# Patient Record
Sex: Male | Born: 1950
Health system: Southern US, Community
[De-identification: ages and names within clinical notes are randomized; demographics above are authoritative.]

## PROBLEM LIST (undated history)

## (undated) DIAGNOSIS — M1990 Unspecified osteoarthritis, unspecified site: Secondary | ICD-10-CM

## (undated) DIAGNOSIS — R001 Bradycardia, unspecified: Secondary | ICD-10-CM

## (undated) DIAGNOSIS — I10 Essential (primary) hypertension: Secondary | ICD-10-CM

## (undated) DIAGNOSIS — I491 Atrial premature depolarization: Secondary | ICD-10-CM

## (undated) DIAGNOSIS — C61 Malignant neoplasm of prostate: Secondary | ICD-10-CM

## (undated) DIAGNOSIS — E7800 Pure hypercholesterolemia, unspecified: Secondary | ICD-10-CM

## (undated) DIAGNOSIS — I493 Ventricular premature depolarization: Secondary | ICD-10-CM

## (undated) DIAGNOSIS — N183 Chronic kidney disease, stage 3 (moderate): Secondary | ICD-10-CM

## (undated) DIAGNOSIS — N182 Chronic kidney disease, stage 2 (mild): Secondary | ICD-10-CM

## (undated) HISTORY — DX: Bradycardia, unspecified: R00.1

## (undated) HISTORY — DX: Chronic kidney disease, stage 2 (mild): N18.2

## (undated) HISTORY — DX: Atrial premature depolarization: I49.1

## (undated) HISTORY — DX: Ventricular premature depolarization: I49.3

## (undated) HISTORY — PX: OTHER SURGICAL HISTORY: SHX169

## (undated) HISTORY — PX: INSERTION PROSTATE RADIATION SEED: SUR718

## (undated) HISTORY — PX: ROTATOR CUFF REPAIR: SHX139

## (undated) HISTORY — PX: PROSTATE BIOPSY: SHX241

---

## 1999-05-27 ENCOUNTER — Emergency Department (HOSPITAL_COMMUNITY): Admission: EM | Admit: 1999-05-27 | Discharge: 1999-05-27 | Payer: Self-pay | Admitting: Emergency Medicine

## 2000-05-09 ENCOUNTER — Emergency Department (HOSPITAL_COMMUNITY): Admission: EM | Admit: 2000-05-09 | Discharge: 2000-05-09 | Payer: Self-pay | Admitting: Emergency Medicine

## 2005-01-14 ENCOUNTER — Emergency Department (HOSPITAL_COMMUNITY): Admission: EM | Admit: 2005-01-14 | Discharge: 2005-01-14 | Payer: Self-pay | Admitting: *Deleted

## 2005-01-15 ENCOUNTER — Emergency Department (HOSPITAL_COMMUNITY): Admission: EM | Admit: 2005-01-15 | Discharge: 2005-01-15 | Payer: Self-pay | Admitting: Emergency Medicine

## 2007-02-18 ENCOUNTER — Emergency Department (HOSPITAL_COMMUNITY): Admission: EM | Admit: 2007-02-18 | Discharge: 2007-02-18 | Payer: Self-pay | Admitting: Emergency Medicine

## 2007-03-07 ENCOUNTER — Emergency Department (HOSPITAL_COMMUNITY): Admission: EM | Admit: 2007-03-07 | Discharge: 2007-03-07 | Payer: Self-pay | Admitting: Emergency Medicine

## 2011-04-06 LAB — URINE MICROSCOPIC-ADD ON

## 2011-04-06 LAB — GC/CHLAMYDIA PROBE AMP, GENITAL
Chlamydia, DNA Probe: NEGATIVE
GC Probe Amp, Genital: POSITIVE — AB

## 2011-04-06 LAB — URINALYSIS, ROUTINE W REFLEX MICROSCOPIC
Bilirubin Urine: NEGATIVE
Glucose, UA: NEGATIVE
Ketones, ur: NEGATIVE
Nitrite: NEGATIVE
Protein, ur: 30 — AB
Specific Gravity, Urine: 1.025
Urobilinogen, UA: 0.2
pH: 5.5

## 2011-04-06 LAB — RPR: RPR Ser Ql: NONREACTIVE

## 2012-04-24 ENCOUNTER — Emergency Department (HOSPITAL_COMMUNITY)
Admission: EM | Admit: 2012-04-24 | Discharge: 2012-04-24 | Disposition: A | Discharge status: Home / Self Care | Payer: BC Managed Care – PPO | Attending: Emergency Medicine | Admitting: Emergency Medicine

## 2012-04-24 ENCOUNTER — Encounter (HOSPITAL_COMMUNITY): Payer: Self-pay | Admitting: *Deleted

## 2012-04-24 DIAGNOSIS — IMO0002 Reserved for concepts with insufficient information to code with codable children: Secondary | ICD-10-CM | POA: Insufficient documentation

## 2012-04-24 DIAGNOSIS — E7800 Pure hypercholesterolemia, unspecified: Secondary | ICD-10-CM | POA: Insufficient documentation

## 2012-04-24 DIAGNOSIS — I10 Essential (primary) hypertension: Secondary | ICD-10-CM | POA: Insufficient documentation

## 2012-04-24 DIAGNOSIS — M5410 Radiculopathy, site unspecified: Secondary | ICD-10-CM

## 2012-04-24 DIAGNOSIS — Z79899 Other long term (current) drug therapy: Secondary | ICD-10-CM | POA: Insufficient documentation

## 2012-04-24 HISTORY — DX: Pure hypercholesterolemia, unspecified: E78.00

## 2012-04-24 HISTORY — DX: Essential (primary) hypertension: I10

## 2012-04-24 MED ORDER — PREDNISONE 20 MG PO TABS
60.0000 mg | ORAL_TABLET | Freq: Once | ORAL | Status: AC
  Administered 2012-04-24: 60 mg via ORAL
  Filled 2012-04-24: qty 3

## 2012-04-24 MED ORDER — KETOROLAC TROMETHAMINE 30 MG/ML IJ SOLN
30.0000 mg | Freq: Once | INTRAMUSCULAR | Status: AC
  Administered 2012-04-24: 30 mg via INTRAMUSCULAR
  Filled 2012-04-24: qty 1

## 2012-04-24 NOTE — ED Notes (Addendum)
PT to ED c/o R back (scapula) and shoulder pain and R thumb numbness.  He was seen at [facility] 1 week ago and was given pain meds and muscle relaxants.  They helped initially, but today his MD was supposed to call in a different medication but did not.  Pt lifts weights, but has not done so in several weeks.

## 2012-04-24 NOTE — ED Provider Notes (Signed)
History     CSN: [id number]  Arrival date & time 04/24/12  0000   First MD Initiated Contact with Patient 04/24/12 [phone]      Chief Complaint  Patient presents with  . Shoulder Pain    (Consider location/radiation/quality/duration/timing/severity/associated sxs/prior treatment) HPI Comments: Patient states, that for the past 2, weeks.  He's had right shoulder pain with discomfort, radiating down his right arm.  He was seen at urgent care, and given Vicodin, and a muscle relaxer, which helped some, but not total relief.  He was seen by his primary care Dr. today Dr. seen he said he would call in a prescription for "something" that will help his discomfort and numbness and tingling.  Unfortunately, this patient is not a cold, and and will not be able to get it until tomorrow with pharmacy and his primary care physician's office opens.  He is not seeking pain medication, or muscle relaxer, as he, states he has these at home, but is looking for pain relief, so he can sleep tonight Patient denies any trauma, neck pain.  He does lift weights, but has not been in approximately 3 weeks.  He does not recall injuring himself while he was lifting weights.  Denies any MVC, fall. previous injury to his back  Patient is a 61 y.o. male presenting with shoulder pain. The history is provided by the patient.  Shoulder Pain This is a recurrent problem. The current episode started 1 to 4 weeks ago. The problem occurs constantly. Associated symptoms include numbness. Pertinent negatives include no chest pain, chills, fever, headaches or weakness.    Past Medical History  Diagnosis Date  . Hypercholesteremia   . Hypertension     History reviewed. No pertinent past surgical history.  No family history on file.  History  Substance Use Topics  . Smoking status: Never Smoker   . Smokeless tobacco: Not on file  . Alcohol Use: No      Review of Systems  Constitutional: Negative for fever and chills.    Respiratory: Negative for shortness of breath.   Cardiovascular: Negative for chest pain.  Musculoskeletal: Negative for back pain.  Neurological: Positive for numbness. Negative for dizziness, weakness and headaches.    Allergies  Review of patient's allergies indicates no known allergies.  Home Medications   Current Outpatient Rx  Name Route Sig Dispense Refill  . ATORVASTATIN CALCIUM 10 MG PO TABS Oral Take 10 mg by mouth daily.    . CYCLOBENZAPRINE HCL 5 MG PO TABS Oral Take 5 mg by mouth 3 (three) times daily as needed.    [name] HYDROCODONE-ACETAMINOPHEN 5-325 MG PO TABS Oral Take 1 tablet by mouth every 6 (six) hours as needed.    [name] VALSARTAN 160 MG PO TABS Oral Take 160 mg by mouth daily.      BP 186/90  Pulse 95  Temp 98.3 F (36.8 C) (Oral)  Resp 16  SpO2 96%  Physical Exam  Constitutional: He is oriented to person, place, and time. He appears well-developed and well-nourished.  HENT:  Head: Normocephalic.  Eyes: Pupils are equal, round, and reactive to light.  Neck: Normal range of motion. Muscular tenderness present.    Cardiovascular: Normal rate.   Pulmonary/Chest: Effort normal. No respiratory distress. He has no wheezes.  Musculoskeletal: Normal range of motion. He exhibits tenderness. He exhibits no edema.       Arms: Neurological: He is alert and oriented to person, place, and time.  No change in range of motion of the right arm.  No weakness in grasp, color, and temperature of right extremity, equal to left  Skin: Skin is warm.    ED Course  Procedures (including critical care time)  Labs Reviewed - No data to display No results found.   1. Radiculopathy affecting upper extremity       MDM  This patient appears to have some radiculopathy, and muscle swelling across the apex of the right shoulder.  I will treat for pain.  Tonight.  Allow him to followup with Dr. Eula Listen tomorrow to continue with the plan.  That was set forth, earlier  today        Arman Filter, NP 04/24/12 [phone]

## 2012-04-28 ENCOUNTER — Emergency Department (HOSPITAL_COMMUNITY)
Admission: EM | Admit: 2012-04-28 | Discharge: 2012-04-28 | Disposition: A | Discharge status: Home / Self Care | Payer: BC Managed Care – PPO | Attending: Emergency Medicine | Admitting: Emergency Medicine

## 2012-04-28 ENCOUNTER — Encounter (HOSPITAL_COMMUNITY): Payer: Self-pay | Admitting: *Deleted

## 2012-04-28 ENCOUNTER — Other Ambulatory Visit: Payer: Self-pay | Admitting: Geriatric Medicine

## 2012-04-28 DIAGNOSIS — E785 Hyperlipidemia, unspecified: Secondary | ICD-10-CM | POA: Insufficient documentation

## 2012-04-28 DIAGNOSIS — R209 Unspecified disturbances of skin sensation: Secondary | ICD-10-CM | POA: Insufficient documentation

## 2012-04-28 DIAGNOSIS — M542 Cervicalgia: Secondary | ICD-10-CM

## 2012-04-28 DIAGNOSIS — I10 Essential (primary) hypertension: Secondary | ICD-10-CM | POA: Insufficient documentation

## 2012-04-28 DIAGNOSIS — M79609 Pain in unspecified limb: Secondary | ICD-10-CM | POA: Insufficient documentation

## 2012-04-28 DIAGNOSIS — M79601 Pain in right arm: Secondary | ICD-10-CM

## 2012-04-28 MED ORDER — OXYCODONE-ACETAMINOPHEN 5-325 MG PO TABS: 2.0000 | ORAL_TABLET | ORAL | Status: DC | PRN

## 2012-04-28 MED ORDER — OXYCODONE-ACETAMINOPHEN 5-325 MG PO TABS
2.0000 | ORAL_TABLET | Freq: Once | ORAL | Status: AC
  Administered 2012-04-28: 2 via ORAL
  Filled 2012-04-28: qty 2

## 2012-04-28 MED ORDER — KETOROLAC TROMETHAMINE 60 MG/2ML IM SOLN
60.0000 mg | Freq: Once | INTRAMUSCULAR | Status: AC
  Administered 2012-04-28: 60 mg via INTRAMUSCULAR
  Filled 2012-04-28: qty 2

## 2012-04-28 NOTE — ED Provider Notes (Signed)
History     CSN: [id number]  Arrival date & time 04/28/12  0820   First MD Initiated Contact with Patient 04/28/12 [phone]      Chief Complaint  Patient presents with  . Arm Pain    R arm    (Consider location/radiation/quality/duration/timing/severity/associated sxs/prior treatment) HPI Comments: Patient is a 61 year old male who presents with right arm pain for the past 2 weeks. The pain started gradually and has progressively worsened since the onset. The patient denies any known trigger but thinks it maybe have been caused by his flu shot that he received last week, even though the arm pain was present before then. The pain is sharp and severe and started in his neck and radiates down to his right thumb. The pain is constant and interferes with sleep. No aggravating/alleviating factors. Patient has seen his PCP and urgent care for the pain and has tried Vicodin and Cyclobenzaprine without relief. Patient denies headache, fever/chills, chest pain, SOB, arthralgias, abdominal pain, saddle paresthesias, bowel/bladder incontinence.   Patient is a 61 y.o. male presenting with arm pain.  Arm Pain Associated symptoms include myalgias and neck pain.    Past Medical History  Diagnosis Date  . Hypercholesteremia   . Hypertension     History reviewed. No pertinent past surgical history.  No family history on file.  History  Substance Use Topics  . Smoking status: Never Smoker   . Smokeless tobacco: Not on file  . Alcohol Use: No      Review of Systems  HENT: Positive for neck pain.   Musculoskeletal: Positive for myalgias.  All other systems reviewed and are negative.    Allergies  Review of patient's allergies indicates no known allergies.  Home Medications   Current Outpatient Rx  Name  Route  Sig  Dispense  Refill  . ATORVASTATIN CALCIUM 10 MG PO TABS   Oral   Take 10 mg by mouth daily.         . CYCLOBENZAPRINE HCL 5 MG PO TABS   Oral   Take 5 mg by mouth 3  (three) times daily as needed. Muscle spasm         . HYDROCODONE-ACETAMINOPHEN 5-325 MG PO TABS   Oral   Take 1 tablet by mouth every 6 (six) hours as needed. Pain         . VALSARTAN 160 MG PO TABS   Oral   Take 160 mg by mouth daily.           BP 218/103  Pulse 67  Temp 97.7 F (36.5 C) (Oral)  Resp 20  SpO2 100%  Physical Exam  Nursing note and vitals reviewed. Constitutional: He is oriented to person, place, and time. He appears well-developed and well-nourished. No distress.  HENT:  Head: Normocephalic and atraumatic.  Eyes: Conjunctivae normal and EOM are normal. Pupils are equal, round, and reactive to light.  Neck: Normal range of motion. Neck supple.  Cardiovascular: Normal rate and regular rhythm.  Exam reveals no gallop and no friction rub.   No murmur heard. Pulmonary/Chest: Effort normal and breath sounds normal. He has no wheezes. He has no rales. He exhibits no tenderness.  Abdominal: Soft. He exhibits no distension. There is no tenderness.  Musculoskeletal: Normal range of motion.       Tenderness to palpation of area to the right of C7 that radiates across the scapular area and into the right arm. No right shoulder, elbow or wrist tenderness to  palpation. No edema of joints or obvious deformity noted. No midline tenderness of spine. Full passive ROM of right arm. Active ROM limited with arm abduction and external rotation of shoulder.   Neurological: He is alert and oriented to person, place, and time. No cranial nerve deficit. Coordination normal.       Strength and sensation equal and intact bilaterally. Patient reports subjective thumb numbness. Tinel's sign negative. Patient is right-handed. Speech is goal-oriented. Moves limbs without ataxia.   Skin: Skin is warm and dry. He is not diaphoretic.  Psychiatric: He has a normal mood and affect. His behavior is normal.    ED Course  Procedures (including critical care time)  Labs Reviewed - No data to  display No results found.   1. Right arm pain       MDM  8:51 AM Patient will have Percocet and toradol IM for right arm pain and right thumb numbness. No signs of neurovascular compromise, injury, or obvious deformity. Patient should follow up with PCP for further evaluation of possible impingement.   9:55 AM Patient reports some relief of arm pain. Patient will be discharged with Percocet prescription. Patient has agreed to follow up with his PCP for further evaluation and management of his symptoms. No further evaluation needed at this time.       Emilia Beck, [place] 04/28/12 [phone]

## 2012-04-28 NOTE — ED Notes (Signed)
Pt sbp .200 dbp, 90 pt stated that he took his bp meds this am

## 2012-04-28 NOTE — ED Provider Notes (Signed)
Medical screening examination/treatment/procedure(s) were conducted as a shared visit with non-physician practitioner(s) and myself.  I personally evaluated the patient during the encounter    Nelia Shi, MD 04/28/12 [phone]

## 2012-04-28 NOTE — ED Notes (Signed)
Pt is denying h/a or lightheadedness at this time.

## 2012-04-28 NOTE — ED Notes (Signed)
Pt reports R arm pain x 2 weeks, reports pain originates from his neck which radiates down to his R arm.  Pt also reports R thumb numbness.  Pt denies any injury.

## 2012-04-29 ENCOUNTER — Other Ambulatory Visit: Payer: Self-pay | Admitting: Geriatric Medicine

## 2012-04-29 ENCOUNTER — Encounter (HOSPITAL_COMMUNITY): Payer: Self-pay | Admitting: Emergency Medicine

## 2012-04-29 ENCOUNTER — Emergency Department (HOSPITAL_COMMUNITY)
Admission: EM | Admit: 2012-04-29 | Discharge: 2012-04-29 | Disposition: A | Discharge status: Home / Self Care | Payer: BC Managed Care – PPO | Attending: Emergency Medicine | Admitting: Emergency Medicine

## 2012-04-29 DIAGNOSIS — E785 Hyperlipidemia, unspecified: Secondary | ICD-10-CM | POA: Insufficient documentation

## 2012-04-29 DIAGNOSIS — M5412 Radiculopathy, cervical region: Secondary | ICD-10-CM | POA: Insufficient documentation

## 2012-04-29 DIAGNOSIS — M5410 Radiculopathy, site unspecified: Secondary | ICD-10-CM

## 2012-04-29 DIAGNOSIS — M542 Cervicalgia: Secondary | ICD-10-CM

## 2012-04-29 DIAGNOSIS — I10 Essential (primary) hypertension: Secondary | ICD-10-CM | POA: Insufficient documentation

## 2012-04-29 DIAGNOSIS — R209 Unspecified disturbances of skin sensation: Secondary | ICD-10-CM | POA: Insufficient documentation

## 2012-04-29 MED ORDER — DEXAMETHASONE SODIUM PHOSPHATE 10 MG/ML IJ SOLN
10.0000 mg | Freq: Once | INTRAMUSCULAR | Status: AC
  Administered 2012-04-29: 10 mg via INTRAMUSCULAR
  Filled 2012-04-29: qty 1

## 2012-04-29 MED ORDER — KETOROLAC TROMETHAMINE 30 MG/ML IJ SOLN
30.0000 mg | Freq: Once | INTRAMUSCULAR | Status: AC
  Administered 2012-04-29: 30 mg via INTRAMUSCULAR
  Filled 2012-04-29: qty 1

## 2012-04-29 NOTE — ED Notes (Signed)
Seen here yesterday for same, has pain in right arm-- states received pain meds which helped some, but still in pain, has MRI scheduled for am, wants something that will last until then

## 2012-04-29 NOTE — ED Provider Notes (Signed)
History     CSN: [id number]  Arrival date & time 04/29/12  1239   First MD Initiated Contact with Patient 04/29/12 1255      Chief Complaint  Patient presents with  . right arm pain     (Consider location/radiation/quality/duration/timing/severity/associated sxs/prior treatment) HPI Comments: Patient is a 61 year old male who presents with right arm pain for the past 2 weeks. The pain started gradually and has progressively worsened since the onset. . The pain is sharp and severe and started in his neck and radiates down to his right thumb. The pain is constant and interferes with sleep. Pain associated with numbness and tingling.  No aggravating/alleviating factors. Patient has seen his PCP and has been in the ED twice for the same pain.  He was seen in the ED yesterday.  He was prescribed Percocet, which he reports helps somewhat.  However, he is requesting something more to help with the pain and get him through until tomorrow.  He is scheduled for a MRI tomorrow morning.  Patient denies headache, fever/chills, chest pain, SOB, arthralgias, abdominal pain, saddle paresthesias, bowel/bladder incontinence.    The history is provided by the patient.    Past Medical History  Diagnosis Date  . Hypercholesteremia   . Hypertension     History reviewed. No pertinent past surgical history.  No family history on file.  History  Substance Use Topics  . Smoking status: Never Smoker   . Smokeless tobacco: Not on file  . Alcohol Use: No      Review of Systems  Constitutional: Negative for fever and chills.  Respiratory: Negative for shortness of breath.   Cardiovascular: Negative for chest pain.  Gastrointestinal: Negative for nausea and vomiting.  Skin: Negative for color change.  Neurological: Positive for numbness. Negative for headaches.    Allergies  Review of patient's allergies indicates no known allergies.  Home Medications   Current Outpatient Rx  Name  Route  Sig   Dispense  Refill  . ATORVASTATIN CALCIUM 10 MG PO TABS   Oral   Take 10 mg by mouth daily.         . CYCLOBENZAPRINE HCL 5 MG PO TABS   Oral   Take 5 mg by mouth 3 (three) times daily as needed. Muscle spasm         . HYDROCODONE-ACETAMINOPHEN 5-325 MG PO TABS   Oral   Take 1 tablet by mouth every 6 (six) hours as needed. Pain         . OXYCODONE-ACETAMINOPHEN 5-325 MG PO TABS   Oral   Take 2 tablets by mouth every 4 (four) hours as needed for pain.   15 tablet   0   . VALSARTAN 160 MG PO TABS   Oral   Take 160 mg by mouth daily.           BP 154/90  Pulse 74  Temp 98.2 F (36.8 C) (Oral)  Resp 18  SpO2 100%  Physical Exam  Nursing note and vitals reviewed. Constitutional: He appears well-developed and well-nourished. No distress.  HENT:  Head: Normocephalic and atraumatic.  Neck: Normal range of motion. Neck supple.  Cardiovascular: Normal rate, regular rhythm and normal heart sounds.   Pulmonary/Chest: Effort normal and breath sounds normal.  Musculoskeletal: Normal range of motion.       Right shoulder: He exhibits normal range of motion, no swelling, no effusion, no deformity and normal strength.       Tenderness to  palpation of the right scapula.  No elbow or wrist tenderness to palpation. No edema of joints or obvious deformity noted. No midline tenderness of spine. Full passive ROM of right arm. Active ROM limited with arm abduction and external rotation of shoulder.     Neurological: He is alert. No sensory deficit.  Skin: Skin is warm and dry. No bruising and no ecchymosis noted. He is not diaphoretic. No erythema.  Psychiatric: He has a normal mood and affect.    ED Course  Procedures (including critical care time)  Labs Reviewed - No data to display No results found.   No diagnosis found.    MDM  Patient complaining of right arm pain.  Third ED visit for the same.  Pain most likely cervical radiculopathy.  Patient has MRI scheduled in  the morning.  Patient has Percocet prescription at home.  Patient given short of Decadron and shot of Toradol in the ED and discharged home to follow up for his MRI tomorrow.        Pascal Lux [place], PA-C 04/29/12 1734

## 2012-04-30 ENCOUNTER — Other Ambulatory Visit: Payer: BC Managed Care – PPO

## 2012-05-01 NOTE — ED Provider Notes (Signed)
Medical screening examination/treatment/procedure(s) were performed by non-physician practitioner and as supervising physician I was immediately available for consultation/collaboration.   Maanya Hippert E Phuong Hillary, MD 05/01/12 1459 

## 2012-05-02 ENCOUNTER — Other Ambulatory Visit: Payer: BC Managed Care – PPO

## 2012-05-05 ENCOUNTER — Ambulatory Visit
Admission: RE | Admit: 2012-05-05 | Discharge: 2012-05-05 | Disposition: A | Discharge status: Home / Self Care | Payer: BC Managed Care – PPO | Source: Ambulatory Visit | Attending: Geriatric Medicine | Admitting: Geriatric Medicine

## 2012-05-05 DIAGNOSIS — M542 Cervicalgia: Secondary | ICD-10-CM

## 2012-07-25 NOTE — ED Provider Notes (Signed)
Medical screening examination/treatment/procedure(s) were performed by non-physician practitioner and as supervising physician I was immediately available for consultation/collaboration.  Olivia Mackie, MD 07/25/12 1946

## 2012-08-21 ENCOUNTER — Other Ambulatory Visit: Payer: Self-pay | Admitting: Neurosurgery

## 2012-08-21 DIAGNOSIS — M47812 Spondylosis without myelopathy or radiculopathy, cervical region: Secondary | ICD-10-CM

## 2012-09-05 ENCOUNTER — Ambulatory Visit
Admission: RE | Admit: 2012-09-05 | Discharge: 2012-09-05 | Disposition: A | Discharge status: Home / Self Care | Payer: BC Managed Care – PPO | Source: Ambulatory Visit | Attending: Neurosurgery | Admitting: Neurosurgery

## 2012-09-05 DIAGNOSIS — M47812 Spondylosis without myelopathy or radiculopathy, cervical region: Secondary | ICD-10-CM

## 2013-03-19 ENCOUNTER — Other Ambulatory Visit: Payer: Self-pay | Admitting: Neurosurgery

## 2013-03-19 DIAGNOSIS — IMO0002 Reserved for concepts with insufficient information to code with codable children: Secondary | ICD-10-CM

## 2013-03-28 ENCOUNTER — Ambulatory Visit
Admission: RE | Admit: 2013-03-28 | Discharge: 2013-03-28 | Disposition: A | Discharge status: Home / Self Care | Payer: BC Managed Care – PPO | Source: Ambulatory Visit | Attending: Neurosurgery | Admitting: Neurosurgery

## 2013-03-28 ENCOUNTER — Emergency Department (HOSPITAL_COMMUNITY)
Admission: EM | Admit: 2013-03-28 | Discharge: 2013-03-28 | Disposition: A | Discharge status: Home / Self Care | Payer: BC Managed Care – PPO | Source: Home / Self Care | Attending: Family Medicine | Admitting: Family Medicine

## 2013-03-28 ENCOUNTER — Encounter (HOSPITAL_COMMUNITY): Payer: Self-pay | Admitting: Emergency Medicine

## 2013-03-28 DIAGNOSIS — IMO0002 Reserved for concepts with insufficient information to code with codable children: Secondary | ICD-10-CM

## 2013-03-28 DIAGNOSIS — H6120 Impacted cerumen, unspecified ear: Secondary | ICD-10-CM

## 2013-03-28 DIAGNOSIS — H6122 Impacted cerumen, left ear: Secondary | ICD-10-CM

## 2013-03-28 NOTE — ED Provider Notes (Signed)
Dennis Huffman is a 62 y.o. male who presents to Urgent Care today for fullness and ear pain bilaterally.  Symptoms have been present since this morning. Patient denies any fevers chills nausea vomiting diarrhea. He feels well otherwise. He's tried using Debrox over-the-counter eardrops which did not help.    Past Medical History  Diagnosis Date  . Hypercholesteremia   . Hypertension    History  Substance Use Topics  . Smoking status: Never Smoker   . Smokeless tobacco: Not on file  . Alcohol Use: No   ROS as above Medications reviewed. No current facility-administered medications for this encounter.   Current Outpatient Prescriptions  Medication Sig Dispense Refill  . atorvastatin (LIPITOR) 10 MG tablet Take 10 mg by mouth daily.      . cyclobenzaprine (FLEXERIL) 5 MG tablet Take 5 mg by mouth 3 (three) times daily as needed. Muscle spasm      . HYDROcodone-acetaminophen (NORCO/VICODIN) 5-325 MG per tablet Take 1 tablet by mouth every 6 (six) hours as needed. Pain      . oxyCODONE-acetaminophen (PERCOCET/ROXICET) 5-325 MG per tablet Take 2 tablets by mouth every 4 (four) hours as needed for pain.  15 tablet  0  . valsartan (DIOVAN) 160 MG tablet Take 160 mg by mouth daily.        Exam:  BP 125/91  Pulse 67  Temp(Src) 98.5 F (36.9 C) (Oral)  Resp 18  SpO2 95% Gen: Well NAD HEENT: EOMI,  MMM, left ear occluded by cerumen. Right tympanic membrane normal. Posterior pharynx is normal. Lungs: CTABL Nl WOB Heart: RRR no MRG  Following removal of cerumen patient's left tympanic membrane was slightly retracted but otherwise normal. The ear canal is somewhat erythematous. Patient had considerable improvement in his symptoms  Assessment and Plan: 62 y.o. male with cerumen impaction status post removal. Followup as needed. Use Debrox ear drops as needed Discussed warning signs or symptoms. Please see discharge instructions. Patient expresses understanding.     Rodolph Bong,  MD 03/28/13 [phone]

## 2013-03-28 NOTE — ED Notes (Signed)
Pt c/o bilateral ear clogged onset this am when he woke up Wants for [place] to irrigate his ears out Denies: fevers, drainage, pain Alert w/no signs of acute distress.

## 2014-04-21 ENCOUNTER — Ambulatory Visit (INDEPENDENT_AMBULATORY_CARE_PROVIDER_SITE_OTHER): Payer: BC Managed Care – PPO | Admitting: Interventional Cardiology

## 2014-04-21 ENCOUNTER — Encounter: Payer: Self-pay | Admitting: Interventional Cardiology

## 2014-04-21 VITALS — BP 140/90 | HR 60 | Ht 71.0 in | Wt 264.8 lb

## 2014-04-21 DIAGNOSIS — R001 Bradycardia, unspecified: Secondary | ICD-10-CM

## 2014-04-21 DIAGNOSIS — E785 Hyperlipidemia, unspecified: Secondary | ICD-10-CM | POA: Insufficient documentation

## 2014-04-21 DIAGNOSIS — R002 Palpitations: Secondary | ICD-10-CM

## 2014-04-21 NOTE — Patient Instructions (Signed)
Dr. Varanasi will see you back on an as needed basis.  Your physician recommends that you continue on your current medications as directed. Please refer to the Current Medication list given to you today.  

## 2014-04-21 NOTE — Progress Notes (Signed)
Patient ID: Dennis Huffman, male   DOB: Aug 13, 1950, 63 y.o.   MRN: [id number]     Patient ID: Dennis Huffman MRN: [id number] DOB/AGE: Nov 07, 1950 63 y.o.   Referring Physician  Dr. Lysle Rubens   Reason for Consultation palpitations  HPI: 63 y/o who had arm pain and was given hydrocodone a year ago.  At that time, he he noticed some palpitations so he stopped the medicine.  The palpitations then resolved.  Of late, he has had some more palpitations but with less frequency.  ECG was done with Dr. Lysle Rubens and his HR was slow, per his report 40 bpm.  He exercises regularly.  He does the treadmill and can get his HR up to 135.  No CP, SHOB,  Dizziness, syncope.    Due to his age, he was referred here to be evaluated for bradycardia.     Current Outpatient Prescriptions  Medication Sig Dispense Refill  . atorvastatin (LIPITOR) 10 MG tablet Take 10 mg by mouth daily.      [name] GLUCOSAMINE CHONDROITIN COMPLX PO Take by mouth.      . valsartan (DIOVAN) 160 MG tablet Take 160 mg by mouth daily. Take 0.5 tabs daily       No current facility-administered medications for this visit.   Past Medical History  Diagnosis Date  . Hypercholesteremia   . Hypertension     Family History  Problem Relation Age of Onset  . Stroke Sister     History   Social History  . Marital Status: Single    Spouse Name: N/A    Number of Children: N/A  . Years of Education: N/A   Occupational History  . Not on file.   Social History Main Topics  . Smoking status: Never Smoker   . Smokeless tobacco: Not on file  . Alcohol Use: No  . Drug Use: No  . Sexual Activity:    Other Topics Concern  . Not on file   Social History Narrative  . No narrative on file    No past surgical history on file.    (Not in a hospital admission)  Review of systems complete and found to be negative unless listed above .  No nausea, vomiting.  No fever chills, No focal weakness,  No palpitations.  Physical Exam: Filed  Vitals:   04/21/14 0928  BP: 140/90  Pulse: 60    Weight: 264 lb 12.8 oz (120.112 kg)  Physical exam:  [place]/AT EOMI No JVD, No carotid bruit RRR S1S2  No wheezing Soft. NT, nondistended No edema. No focal motor or sensory deficits Normal affect  Labs:   No results found for this basename: WBC,  HGB,  HCT,  MCV,  PLT   No results found for this basename: NA, K, CL, CO2, BUN, CREATININE, CALCIUM, LABALBU, PROT, BILITOT, ALKPHOS, ALT, AST, GLUCOSE,  in the last 168 hours No results found for this basename: CKTOTAL,  CKMB,  CKMBINDEX,  TROPONINI    No results found for this basename: CHOL   No results found for this basename: HDL   No results found for this basename: LDLCALC   No results found for this basename: TRIG   No results found for this basename: CHOLHDL   No results found for this basename: LDLDIRECT       BMW:UXLKGM; ECG 03/17/14: sinus bradycardia  ASSESSMENT AND PLAN:  1) Bradycardia: No sx.  He exercises regularly and is in good shape. Bradycardia resolved on todays ECG.  No  indications for a pacer.  Palpitations have decreased.  If he has more sx, could consider event monitor. He will call if sx get worse.   Hyperlipidemia Managed by Dr,. Husain.    Continue regular exercise.  Signed:   Mina Marble, MD, [facility] 04/21/2014, 9:58 AM

## 2014-08-06 ENCOUNTER — Encounter (HOSPITAL_COMMUNITY): Payer: Self-pay | Admitting: Emergency Medicine

## 2014-08-06 ENCOUNTER — Emergency Department (HOSPITAL_COMMUNITY)
Admission: EM | Admit: 2014-08-06 | Discharge: 2014-08-07 | Disposition: A | Discharge status: Home / Self Care | Payer: BLUE CROSS/BLUE SHIELD | Attending: Emergency Medicine | Admitting: Emergency Medicine

## 2014-08-06 DIAGNOSIS — I10 Essential (primary) hypertension: Secondary | ICD-10-CM | POA: Diagnosis not present

## 2014-08-06 DIAGNOSIS — R112 Nausea with vomiting, unspecified: Secondary | ICD-10-CM | POA: Insufficient documentation

## 2014-08-06 DIAGNOSIS — E780 Pure hypercholesterolemia: Secondary | ICD-10-CM | POA: Insufficient documentation

## 2014-08-06 MED ORDER — SODIUM CHLORIDE 0.9 % IV BOLUS (SEPSIS)
1000.0000 mL | Freq: Once | INTRAVENOUS | Status: AC
  Administered 2014-08-06: 1000 mL via INTRAVENOUS

## 2014-08-06 MED ORDER — ONDANSETRON HCL 4 MG/2ML IJ SOLN
4.0000 mg | Freq: Once | INTRAMUSCULAR | Status: AC
  Administered 2014-08-06: 4 mg via INTRAVENOUS
  Filled 2014-08-06: qty 2

## 2014-08-06 MED ORDER — SODIUM CHLORIDE 0.9 % IV SOLN: Freq: Once | INTRAVENOUS | Status: AC

## 2014-08-06 NOTE — ED Notes (Signed)
Pt reports he has been vomiting today possibly due to some liver pudding he ate this morning for breakfast. Pt reports he has vomited x9 today. Pt is alert and oriented.

## 2014-08-07 LAB — COMPREHENSIVE METABOLIC PANEL
ALT: 27 U/L (ref 0–53)
AST: 31 U/L (ref 0–37)
Albumin: 4.2 g/dL (ref 3.5–5.2)
Alkaline Phosphatase: 42 U/L (ref 39–117)
Anion gap: 11 (ref 5–15)
BUN: 22 mg/dL (ref 6–23)
CO2: 25 mmol/L (ref 19–32)
Calcium: 9 mg/dL (ref 8.4–10.5)
Chloride: 104 mmol/L (ref 96–112)
Creatinine, Ser: 1.5 mg/dL — ABNORMAL HIGH (ref 0.50–1.35)
GFR calc Af Amer: 55 mL/min — ABNORMAL LOW (ref >90)
GFR calc non Af Amer: 47 mL/min — ABNORMAL LOW (ref >90)
Glucose, Bld: 117 mg/dL — ABNORMAL HIGH (ref 70–99)
Potassium: 4 mmol/L (ref 3.5–5.1)
Sodium: 140 mmol/L (ref 135–145)
Total Bilirubin: 2 mg/dL — ABNORMAL HIGH (ref 0.3–1.2)
Total Protein: 7.5 g/dL (ref 6.0–8.3)

## 2014-08-07 LAB — CBC WITH DIFFERENTIAL/PLATELET
Basophils Absolute: 0 10*3/uL (ref 0.0–0.1)
Basophils Relative: 0 % (ref 0–1)
Eosinophils Absolute: 0 10*3/uL (ref 0.0–0.7)
Eosinophils Relative: 0 % (ref 0–5)
HCT: 41.6 % (ref 39.0–52.0)
Hemoglobin: 15.1 g/dL (ref 13.0–17.0)
Lymphocytes Relative: 7 % — ABNORMAL LOW (ref 12–46)
Lymphs Abs: 0.5 10*3/uL — ABNORMAL LOW (ref 0.7–4.0)
MCH: 28.4 pg (ref 26.0–34.0)
MCHC: 36.3 g/dL — ABNORMAL HIGH (ref 30.0–36.0)
MCV: 78.3 fL (ref 78.0–100.0)
Monocytes Absolute: 0.2 10*3/uL (ref 0.1–1.0)
Monocytes Relative: 3 % (ref 3–12)
Neutro Abs: 6.2 10*3/uL (ref 1.7–7.7)
Neutrophils Relative %: 90 % — ABNORMAL HIGH (ref 43–77)
Platelets: 142 10*3/uL — ABNORMAL LOW (ref 150–400)
RBC: 5.31 MIL/uL (ref 4.22–5.81)
RDW: 14.8 % (ref 11.5–15.5)
WBC: 6.9 10*3/uL (ref 4.0–10.5)

## 2014-08-07 LAB — LIPASE, BLOOD: Lipase: 17 U/L (ref 11–59)

## 2014-08-07 MED ORDER — ONDANSETRON 8 MG PO TBDP: 8.0000 mg | ORAL_TABLET | Freq: Three times a day (TID) | ORAL | Status: DC | PRN

## 2014-08-07 NOTE — Discharge Instructions (Signed)

## 2014-08-07 NOTE — ED Provider Notes (Signed)
CSN: [id number]     Arrival date & time 08/06/14  2151 History   First MD Initiated Contact with Patient 08/06/14 2257     Chief Complaint  Patient presents with  . Emesis      HPI Patient presents to the emergency department complaining of nausea vomiting since today.  He's vomited approximately 9 times.  Nonbilious nonbloody vomit.  Denies abdominal pain.  Denies diarrhea.  No fevers or chills.  No recent sick contacts.  Denies urinary symptoms.  Symptoms are mild in severity.  Nothing worsens or improves his symptoms.   Past Medical History  Diagnosis Date  . Hypercholesteremia   . Hypertension    History reviewed. No pertinent past surgical history. Family History  Problem Relation Age of Onset  . Stroke Sister    History  Substance Use Topics  . Smoking status: Never Smoker   . Smokeless tobacco: Not on file  . Alcohol Use: No    Review of Systems  All other systems reviewed and are negative.     Allergies  Hydrocodone  Home Medications   Prior to Admission medications   Medication Sig Start Date End Date Taking? Authorizing Provider  atorvastatin (LIPITOR) 10 MG tablet Take 10 mg by mouth daily.   Yes Historical Provider, MD  GLUCOSAMINE CHONDROITIN COMPLX PO Take by mouth.   Yes Historical Provider, MD  valsartan (DIOVAN) 160 MG tablet Take 80 mg by mouth daily. Take 0.5 tabs daily   Yes Historical Provider, MD  ondansetron (ZOFRAN ODT) 8 MG disintegrating tablet Take 1 tablet (8 mg total) by mouth every 8 (eight) hours as needed for nausea or vomiting. 08/07/14   Hoy Morn, MD   BP 139/81 mmHg  Pulse 83  Temp(Src) 98.7 F (37.1 C) (Oral)  Resp 16  SpO2 98% Physical Exam  Constitutional: He is oriented to person, place, and time. He appears well-developed and well-nourished.  HENT:  Head: Normocephalic and atraumatic.  Eyes: EOM are normal.  Neck: Normal range of motion.  Cardiovascular: Normal rate, regular rhythm, normal heart sounds and intact  distal pulses.   Pulmonary/Chest: Effort normal and breath sounds normal. No respiratory distress.  Abdominal: Soft. He exhibits no distension. There is no tenderness.  Musculoskeletal: Normal range of motion.  Neurological: He is alert and oriented to person, place, and time.  Skin: Skin is warm and dry.  Psychiatric: He has a normal mood and affect. Judgment normal.  Nursing note and vitals reviewed.   ED Course  Procedures (including critical care time) Labs Review Labs Reviewed  CBC WITH DIFFERENTIAL/PLATELET - Abnormal; Notable for the following:    MCHC 36.3 (*)    Platelets 142 (*)    Neutrophils Relative % 90 (*)    Lymphocytes Relative 7 (*)    Lymphs Abs 0.5 (*)    All other components within normal limits  COMPREHENSIVE METABOLIC PANEL - Abnormal; Notable for the following:    Glucose, Bld 117 (*)    Creatinine, Ser 1.50 (*)    Total Bilirubin 2.0 (*)    GFR calc non Af Amer 47 (*)    GFR calc Af Amer 55 (*)    All other components within normal limits  LIPASE, BLOOD    Imaging Review No results found.   EKG Interpretation None      MDM   Final diagnoses:  Nausea and vomiting, vomiting of unspecified type    2:31 AM Patient feels much better at this time.  Discharge  home in good condition.  Likely viral process.  Primary care follow-up.  Home with antinausea medicine.  He understands to return to the ER for new or worsening symptoms.  Repeat abdominal exam is benign.  Vital signs are stable.    Hoy Morn, MD 08/07/14 [phone]

## 2014-11-29 ENCOUNTER — Ambulatory Visit
Admission: RE | Admit: 2014-11-29 | Discharge: 2014-11-29 | Disposition: A | Discharge status: Home / Self Care | Payer: BLUE CROSS/BLUE SHIELD | Source: Ambulatory Visit | Attending: Internal Medicine | Admitting: Internal Medicine

## 2014-11-29 ENCOUNTER — Other Ambulatory Visit: Payer: Self-pay | Admitting: Internal Medicine

## 2014-11-29 DIAGNOSIS — M542 Cervicalgia: Secondary | ICD-10-CM

## 2015-05-02 ENCOUNTER — Ambulatory Visit: Payer: Self-pay | Admitting: Physician Assistant

## 2015-05-09 ENCOUNTER — Encounter (HOSPITAL_COMMUNITY)
Admission: RE | Admit: 2015-05-09 | Discharge: 2015-05-09 | Disposition: A | Discharge status: Home / Self Care | Payer: Worker's Compensation | Source: Ambulatory Visit | Attending: Orthopedic Surgery | Admitting: Orthopedic Surgery

## 2015-05-09 ENCOUNTER — Encounter (HOSPITAL_COMMUNITY): Payer: Self-pay

## 2015-05-09 ENCOUNTER — Other Ambulatory Visit: Payer: Self-pay

## 2015-05-09 DIAGNOSIS — Z01818 Encounter for other preprocedural examination: Secondary | ICD-10-CM | POA: Insufficient documentation

## 2015-05-09 DIAGNOSIS — E785 Hyperlipidemia, unspecified: Secondary | ICD-10-CM | POA: Insufficient documentation

## 2015-05-09 DIAGNOSIS — I129 Hypertensive chronic kidney disease with stage 1 through stage 4 chronic kidney disease, or unspecified chronic kidney disease: Secondary | ICD-10-CM | POA: Diagnosis not present

## 2015-05-09 DIAGNOSIS — R001 Bradycardia, unspecified: Secondary | ICD-10-CM | POA: Diagnosis not present

## 2015-05-09 DIAGNOSIS — Z79899 Other long term (current) drug therapy: Secondary | ICD-10-CM | POA: Insufficient documentation

## 2015-05-09 DIAGNOSIS — M4722 Other spondylosis with radiculopathy, cervical region: Secondary | ICD-10-CM | POA: Insufficient documentation

## 2015-05-09 DIAGNOSIS — Z01812 Encounter for preprocedural laboratory examination: Secondary | ICD-10-CM | POA: Diagnosis not present

## 2015-05-09 DIAGNOSIS — N183 Chronic kidney disease, stage 3 (moderate): Secondary | ICD-10-CM | POA: Diagnosis not present

## 2015-05-09 HISTORY — DX: Unspecified osteoarthritis, unspecified site: M19.90

## 2015-05-09 HISTORY — DX: Chronic kidney disease, stage 3 (moderate): N18.3

## 2015-05-09 LAB — BASIC METABOLIC PANEL
Anion gap: 8 (ref 5–15)
BUN: 19 mg/dL (ref 6–20)
CO2: 24 mmol/L (ref 22–32)
Calcium: 9.5 mg/dL (ref 8.9–10.3)
Chloride: 108 mmol/L (ref 101–111)
Creatinine, Ser: 1.64 mg/dL — ABNORMAL HIGH (ref 0.61–1.24)
GFR calc Af Amer: 49 mL/min — ABNORMAL LOW (ref >60)
GFR calc non Af Amer: 42 mL/min — ABNORMAL LOW (ref >60)
Glucose, Bld: 108 mg/dL — ABNORMAL HIGH (ref 65–99)
Potassium: 3.9 mmol/L (ref 3.5–5.1)
Sodium: 140 mmol/L (ref 135–145)

## 2015-05-09 LAB — CBC
HCT: 39.5 % (ref 39.0–52.0)
Hemoglobin: 14 g/dL (ref 13.0–17.0)
MCH: 27.9 pg (ref 26.0–34.0)
MCHC: 35.4 g/dL (ref 30.0–36.0)
MCV: 78.7 fL (ref 78.0–100.0)
Platelets: 172 10*3/uL (ref 150–400)
RBC: 5.02 MIL/uL (ref 4.22–5.81)
RDW: 14.3 % (ref 11.5–15.5)
WBC: 5.8 10*3/uL (ref 4.0–10.5)

## 2015-05-09 LAB — SURGICAL PCR SCREEN
MRSA, PCR: NEGATIVE
Staphylococcus aureus: NEGATIVE

## 2015-05-09 NOTE — Pre-Procedure Instructions (Signed)
Dennis Huffman  05/09/2015      [facility][id number] Dennis Huffman, Dennis Huffman [id number] [address] [place] [id number] Phone: [phone] Fax: [phone]    Your procedure is scheduled on 05/12/2015 .  Report to [facility] Admitting at 11:00 A.M.  Call this number if you have problems the morning of surgery:  [phone]   Remember:  Do not eat food or drink liquids after midnight.  Take these medicines the morning of surgery with A SIP OF WATER : NOTHING   Do not wear jewelry   Do not wear lotions, powders, or perfumes.  You may wear deodorant.    Men may shave face and neck.   Do not bring valuables to the hospital.   [facility] is not responsible for any belongings or valuables.  Contacts, dentures or bridgework may not be worn into surgery.  Leave your suitcase in the car.  After surgery it may be brought to your room.  For patients admitted to the hospital, discharge time will be determined by your treatment team.  Patients discharged the day of surgery will not be allowed to drive home.   Name and phone number of your driver:   Dennis Huffman   Special instructions:  Special Instructions: [place] - Preparing for Surgery  Before surgery, you can play an important role.  Because skin is not sterile, your skin needs to be as free of germs as possible.  You can reduce the number of germs on you skin by washing with CHG (chlorahexidine gluconate) soap before surgery.  CHG is an antiseptic cleaner which kills germs and bonds with the skin to continue killing germs even after washing.  Please DO NOT use if you have an allergy to CHG or antibacterial soaps.  If your skin becomes reddened/irritated stop using the CHG and inform your nurse when you arrive at Short Stay.  Do not shave (including legs and underarms) for at least 48 hours prior to the first CHG shower.  You may shave your  face.  Please follow these instructions carefully:   1.  Shower with CHG Soap the night before surgery and the  morning of Surgery.  2.  If you choose to wash your hair, wash your hair first as usual with your  normal shampoo.  3.  After you shampoo, rinse your hair and body thoroughly to remove the  Shampoo.  4.  Use CHG as you would any other liquid soap.  You can apply chg directly to the skin and wash gently with scrungie or a clean washcloth.  5.  Apply the CHG Soap to your body ONLY FROM THE NECK DOWN.    Do not use on open wounds or open sores.  Avoid contact with your eyes, ears, mouth and genitals (private parts).  Wash genitals (private parts)   with your normal soap.  6.  Wash thoroughly, paying special attention to the area where your surgery will be performed.  7.  Thoroughly rinse your body with warm water from the neck down.  8.  DO NOT shower/wash with your normal soap after using and rinsing off   the CHG Soap.  9.  Pat yourself dry with a clean towel.            10.  Wear clean pajamas.            11.  Place clean sheets on  your bed the night of your first shower and do not sleep with pets.  Day of Surgery  Do not apply any lotions/deodorants the morning of surgery.  Please wear clean clothes to the hospital/surgery center.  Please read over the following fact sheets that you were given. Pain Booklet, Coughing and Deep Breathing, MRSA Information and Surgical Site Infection Prevention

## 2015-05-09 NOTE — Progress Notes (Addendum)
Pt. Denies chest pain, denies any changes or concerns regarding his chest. Pt. Seen by Dr. Irish Lack in the past for palpitations, pt. Thought it was caused by Hydrocodone so he isn't taking it any longer.

## 2015-05-09 NOTE — Progress Notes (Signed)
   05/09/15 1542  OBSTRUCTIVE SLEEP APNEA  Have you ever been diagnosed with sleep apnea through a sleep study? No  Do you snore loudly (loud enough to be heard through closed doors)?  0  Do you often feel tired, fatigued, or sleepy during the daytime (such as falling asleep during driving or talking to someone)? 0  Has anyone observed you stop breathing during your sleep? 0  Do you have, or are you being treated for high blood pressure? 1  BMI more than 35 kg/m2? 1  Age > 50 (1-yes) 1  Neck circumference greater than:Male 16 inches or larger, Male 17inches or larger? 1  Male Gender (Yes=1) 1  Obstructive Sleep Apnea Score 5  Score 5 or greater  Results sent to PCP

## 2015-05-10 ENCOUNTER — Encounter (HOSPITAL_COMMUNITY): Payer: Self-pay

## 2015-05-10 NOTE — Progress Notes (Signed)
Anesthesia Chart Review:  Pt is 64 year old male scheduled for C3-6 anterior cervical decompression/ discectomy on 05/12/2015 with Dr. Rolena Infante.   PCP is Dr. [name] with [facility].   PMH includes:  HTN, hyperlipidemia, CKD (stage III). Never smoker. BMI 36.   Medications include: lipitor, valsartan.   Preoperative labs reviewed.  Cr 1.64, BUN 19. Baseline Cr around 1.5. Most recent comparison results from PCP's office, Cr was 1.54 on 04/23/15.   EKG 05/09/15: Sinus bradycardia (54 bpm).   Pt has medical clearance for surgery from Dr. Lysle Rubens.   If no changes, I anticipate pt can proceed with surgery as scheduled.   Willeen Cass, FNP-BC [facility] Stay [facility]/Anesthesiology Phone: [phone] 05/10/2015 4:00 PM

## 2015-05-11 MED ORDER — CEFAZOLIN SODIUM-DEXTROSE 2-3 GM-% IV SOLR
2.0000 g | INTRAVENOUS | Status: AC
  Administered 2015-05-12: 2 g via INTRAVENOUS
  Filled 2015-05-11: qty 50

## 2015-05-12 ENCOUNTER — Encounter (HOSPITAL_COMMUNITY): Payer: Self-pay | Admitting: Surgery

## 2015-05-12 ENCOUNTER — Observation Stay (HOSPITAL_COMMUNITY)
Admission: RE | Admit: 2015-05-12 | Discharge: 2015-05-13 | Disposition: A | Discharge status: Home / Self Care | Payer: Worker's Compensation | Source: Ambulatory Visit | Attending: Orthopedic Surgery | Admitting: Orthopedic Surgery

## 2015-05-12 ENCOUNTER — Encounter (HOSPITAL_COMMUNITY)
Admission: RE | Disposition: A | Discharge status: Home / Self Care | Payer: BLUE CROSS/BLUE SHIELD | Source: Ambulatory Visit | Attending: Orthopedic Surgery

## 2015-05-12 ENCOUNTER — Inpatient Hospital Stay (HOSPITAL_COMMUNITY): Payer: Worker's Compensation | Admitting: Certified Registered"

## 2015-05-12 ENCOUNTER — Inpatient Hospital Stay (HOSPITAL_COMMUNITY): Payer: Worker's Compensation

## 2015-05-12 ENCOUNTER — Observation Stay (HOSPITAL_COMMUNITY): Payer: Worker's Compensation

## 2015-05-12 ENCOUNTER — Inpatient Hospital Stay (HOSPITAL_COMMUNITY): Payer: Worker's Compensation | Admitting: Emergency Medicine

## 2015-05-12 DIAGNOSIS — M50122 Cervical disc disorder at C5-C6 level with radiculopathy: Secondary | ICD-10-CM | POA: Diagnosis not present

## 2015-05-12 DIAGNOSIS — M4692 Unspecified inflammatory spondylopathy, cervical region: Secondary | ICD-10-CM | POA: Insufficient documentation

## 2015-05-12 DIAGNOSIS — I129 Hypertensive chronic kidney disease with stage 1 through stage 4 chronic kidney disease, or unspecified chronic kidney disease: Secondary | ICD-10-CM | POA: Diagnosis not present

## 2015-05-12 DIAGNOSIS — Z981 Arthrodesis status: Secondary | ICD-10-CM

## 2015-05-12 DIAGNOSIS — Z419 Encounter for procedure for purposes other than remedying health state, unspecified: Secondary | ICD-10-CM

## 2015-05-12 DIAGNOSIS — Z87891 Personal history of nicotine dependence: Secondary | ICD-10-CM | POA: Insufficient documentation

## 2015-05-12 DIAGNOSIS — M50121 Cervical disc disorder at C4-C5 level with radiculopathy: Secondary | ICD-10-CM | POA: Insufficient documentation

## 2015-05-12 DIAGNOSIS — M5011 Cervical disc disorder with radiculopathy,  high cervical region: Secondary | ICD-10-CM | POA: Diagnosis not present

## 2015-05-12 DIAGNOSIS — M542 Cervicalgia: Secondary | ICD-10-CM | POA: Diagnosis present

## 2015-05-12 DIAGNOSIS — E7800 Pure hypercholesterolemia, unspecified: Secondary | ICD-10-CM | POA: Diagnosis not present

## 2015-05-12 DIAGNOSIS — Z79899 Other long term (current) drug therapy: Secondary | ICD-10-CM | POA: Diagnosis not present

## 2015-05-12 DIAGNOSIS — N183 Chronic kidney disease, stage 3 (moderate): Secondary | ICD-10-CM | POA: Insufficient documentation

## 2015-05-12 DIAGNOSIS — M5412 Radiculopathy, cervical region: Secondary | ICD-10-CM | POA: Diagnosis present

## 2015-05-12 HISTORY — PX: ANTERIOR CERVICAL DECOMP/DISCECTOMY FUSION: SHX1161

## 2015-05-12 SURGERY — ANTERIOR CERVICAL DECOMPRESSION/DISCECTOMY FUSION 3 LEVELS
Anesthesia: General | Site: Neck

## 2015-05-12 MED ORDER — METHOCARBAMOL 500 MG PO TABS
500.0000 mg | ORAL_TABLET | Freq: Four times a day (QID) | ORAL | Status: DC | PRN
  Administered 2015-05-12: 500 mg via ORAL
  Filled 2015-05-12: qty 1

## 2015-05-12 MED ORDER — DEXAMETHASONE SODIUM PHOSPHATE 10 MG/ML IJ SOLN
INTRAMUSCULAR | Status: AC
  Filled 2015-05-12: qty 1

## 2015-05-12 MED ORDER — SUFENTANIL CITRATE 50 MCG/ML IV SOLN
INTRAVENOUS | Status: AC
  Filled 2015-05-12: qty 1

## 2015-05-12 MED ORDER — DEXAMETHASONE 4 MG PO TABS: 4.0000 mg | ORAL_TABLET | Freq: Four times a day (QID) | ORAL | Status: AC

## 2015-05-12 MED ORDER — VECURONIUM BROMIDE 10 MG IV SOLR
INTRAVENOUS | Status: AC
  Filled 2015-05-12: qty 10

## 2015-05-12 MED ORDER — OXYCODONE-ACETAMINOPHEN 10-325 MG PO TABS: 1.0000 | ORAL_TABLET | ORAL | Status: DC | PRN

## 2015-05-12 MED ORDER — ONDANSETRON HCL 4 MG PO TABS: 4.0000 mg | ORAL_TABLET | Freq: Three times a day (TID) | ORAL | Status: DC | PRN

## 2015-05-12 MED ORDER — DEXAMETHASONE SODIUM PHOSPHATE 10 MG/ML IJ SOLN
INTRAMUSCULAR | Status: DC | PRN
  Administered 2015-05-12: 10 mg via INTRAVENOUS

## 2015-05-12 MED ORDER — SODIUM CHLORIDE 0.9 % IJ SOLN: 3.0000 mL | Freq: Two times a day (BID) | INTRAMUSCULAR | Status: DC

## 2015-05-12 MED ORDER — ONDANSETRON HCL 4 MG/2ML IJ SOLN
4.0000 mg | Freq: Once | INTRAMUSCULAR | Status: AC | PRN
  Administered 2015-05-12: 4 mg via INTRAVENOUS

## 2015-05-12 MED ORDER — LACTATED RINGERS IV SOLN
INTRAVENOUS | Status: DC
  Administered 2015-05-12 – 2015-05-13 (×2): via INTRAVENOUS

## 2015-05-12 MED ORDER — SODIUM CHLORIDE 0.9 % IJ SOLN
INTRAMUSCULAR | Status: AC
  Filled 2015-05-12: qty 10

## 2015-05-12 MED ORDER — ROCURONIUM BROMIDE 50 MG/5ML IV SOLN
INTRAVENOUS | Status: AC
  Filled 2015-05-12: qty 1

## 2015-05-12 MED ORDER — GLYCOPYRROLATE 0.2 MG/ML IJ SOLN
INTRAMUSCULAR | Status: DC | PRN
  Administered 2015-05-12 (×2): 0.1 mg via INTRAVENOUS

## 2015-05-12 MED ORDER — ONDANSETRON HCL 4 MG/2ML IJ SOLN
4.0000 mg | INTRAMUSCULAR | Status: DC | PRN
  Administered 2015-05-12 – 2015-05-13 (×2): 4 mg via INTRAVENOUS
  Filled 2015-05-12 (×2): qty 2

## 2015-05-12 MED ORDER — PROPOFOL 10 MG/ML IV BOLUS
INTRAVENOUS | Status: DC | PRN
  Administered 2015-05-12: 200 mg via INTRAVENOUS

## 2015-05-12 MED ORDER — METHOCARBAMOL 1000 MG/10ML IJ SOLN
500.0000 mg | Freq: Four times a day (QID) | INTRAVENOUS | Status: DC | PRN
  Filled 2015-05-12: qty 5

## 2015-05-12 MED ORDER — PROPOFOL 10 MG/ML IV BOLUS
INTRAVENOUS | Status: AC
  Filled 2015-05-12: qty 20

## 2015-05-12 MED ORDER — MIDAZOLAM HCL 2 MG/2ML IJ SOLN
INTRAMUSCULAR | Status: AC
  Filled 2015-05-12: qty 2

## 2015-05-12 MED ORDER — LACTATED RINGERS IV SOLN
INTRAVENOUS | Status: DC
  Administered 2015-05-12 (×3): via INTRAVENOUS

## 2015-05-12 MED ORDER — OXYCODONE HCL 5 MG PO TABS
10.0000 mg | ORAL_TABLET | ORAL | Status: DC | PRN
  Administered 2015-05-13: 10 mg via ORAL
  Filled 2015-05-12: qty 2

## 2015-05-12 MED ORDER — MENTHOL 3 MG MT LOZG: 1.0000 | LOZENGE | OROMUCOSAL | Status: DC | PRN

## 2015-05-12 MED ORDER — MIDAZOLAM HCL 5 MG/5ML IJ SOLN
INTRAMUSCULAR | Status: DC | PRN
  Administered 2015-05-12: 2 mg via INTRAVENOUS

## 2015-05-12 MED ORDER — SUFENTANIL CITRATE 50 MCG/ML IV SOLN
INTRAVENOUS | Status: DC | PRN
  Administered 2015-05-12 (×6): 10 ug via INTRAVENOUS
  Administered 2015-05-12: 20 ug via INTRAVENOUS
  Administered 2015-05-12: 10 ug via INTRAVENOUS

## 2015-05-12 MED ORDER — SODIUM CHLORIDE 0.9 % IV SOLN: 250.0000 mL | INTRAVENOUS | Status: DC

## 2015-05-12 MED ORDER — DEXAMETHASONE SODIUM PHOSPHATE 4 MG/ML IJ SOLN
4.0000 mg | Freq: Four times a day (QID) | INTRAMUSCULAR | Status: AC
  Administered 2015-05-12 – 2015-05-13 (×3): 4 mg via INTRAVENOUS
  Filled 2015-05-12 (×3): qty 1

## 2015-05-12 MED ORDER — BUPIVACAINE-EPINEPHRINE 0.25% -1:200000 IJ SOLN
INTRAMUSCULAR | Status: DC | PRN
  Administered 2015-05-12: 7 mL

## 2015-05-12 MED ORDER — CEFAZOLIN SODIUM 1-5 GM-% IV SOLN
1.0000 g | Freq: Three times a day (TID) | INTRAVENOUS | Status: AC
  Administered 2015-05-12 – 2015-05-13 (×2): 1 g via INTRAVENOUS
  Filled 2015-05-12 (×3): qty 50

## 2015-05-12 MED ORDER — THROMBIN 20000 UNITS EX SOLR
CUTANEOUS | Status: AC
  Filled 2015-05-12: qty 20000

## 2015-05-12 MED ORDER — BUPIVACAINE-EPINEPHRINE (PF) 0.25% -1:200000 IJ SOLN
INTRAMUSCULAR | Status: AC
  Filled 2015-05-12: qty 30

## 2015-05-12 MED ORDER — METHOCARBAMOL 500 MG PO TABS: 500.0000 mg | ORAL_TABLET | Freq: Three times a day (TID) | ORAL | Status: DC | PRN

## 2015-05-12 MED ORDER — PHENYLEPHRINE HCL 10 MG/ML IJ SOLN
INTRAMUSCULAR | Status: DC | PRN
  Administered 2015-05-12 (×3): 80 ug via INTRAVENOUS
  Administered 2015-05-12: 40 ug via INTRAVENOUS

## 2015-05-12 MED ORDER — ROCURONIUM BROMIDE 100 MG/10ML IV SOLN
INTRAVENOUS | Status: DC | PRN
  Administered 2015-05-12: 50 mg via INTRAVENOUS

## 2015-05-12 MED ORDER — LIDOCAINE HCL (CARDIAC) 20 MG/ML IV SOLN
INTRAVENOUS | Status: DC | PRN
  Administered 2015-05-12: 50 mg via INTRAVENOUS

## 2015-05-12 MED ORDER — HEMOSTATIC AGENTS (NO CHARGE) OPTIME
TOPICAL | Status: DC | PRN
  Administered 2015-05-12: 1 via TOPICAL

## 2015-05-12 MED ORDER — ONDANSETRON HCL 4 MG/2ML IJ SOLN
INTRAMUSCULAR | Status: AC
  Filled 2015-05-12: qty 2

## 2015-05-12 MED ORDER — IRBESARTAN 150 MG PO TABS
150.0000 mg | ORAL_TABLET | Freq: Every day | ORAL | Status: DC
  Administered 2015-05-12 – 2015-05-13 (×2): 150 mg via ORAL
  Filled 2015-05-12 (×2): qty 1

## 2015-05-12 MED ORDER — PHENOL 1.4 % MT LIQD
1.0000 | OROMUCOSAL | Status: DC | PRN
Start: 2015-05-12 — End: 2015-05-13

## 2015-05-12 MED ORDER — FENTANYL CITRATE (PF) 100 MCG/2ML IJ SOLN: 25.0000 ug | INTRAMUSCULAR | Status: DC | PRN

## 2015-05-12 MED ORDER — THROMBIN 20000 UNITS EX KIT
PACK | CUTANEOUS | Status: DC | PRN
  Administered 2015-05-12: 20 mL via TOPICAL

## 2015-05-12 MED ORDER — 0.9 % SODIUM CHLORIDE (POUR BTL) OPTIME
TOPICAL | Status: DC | PRN
  Administered 2015-05-12: 1000 mL

## 2015-05-12 MED ORDER — LIDOCAINE HCL (CARDIAC) 20 MG/ML IV SOLN
INTRAVENOUS | Status: AC
  Filled 2015-05-12: qty 5

## 2015-05-12 MED ORDER — SODIUM CHLORIDE 0.9 % IJ SOLN: 3.0000 mL | INTRAMUSCULAR | Status: DC | PRN

## 2015-05-12 MED ORDER — VECURONIUM BROMIDE 10 MG IV SOLR
INTRAVENOUS | Status: DC | PRN
  Administered 2015-05-12: 2 mg via INTRAVENOUS
  Administered 2015-05-12 (×3): 1 mg via INTRAVENOUS
  Administered 2015-05-12: 3 mg via INTRAVENOUS
  Administered 2015-05-12: 1 mg via INTRAVENOUS

## 2015-05-12 MED ORDER — DEXTROSE 5 % IV SOLN
10.0000 mg | INTRAVENOUS | Status: DC | PRN
  Administered 2015-05-12: 40 ug/min via INTRAVENOUS

## 2015-05-12 SURGICAL SUPPLY — 67 items
BLADE SURG ROTATE 9660 (MISCELLANEOUS) IMPLANT
BUR EGG ELITE 4.0 (BURR) IMPLANT
BUR MATCHSTICK NEURO 3.0 LAGG (BURR) IMPLANT
CANISTER SUCTION 2500CC (MISCELLANEOUS) ×2 IMPLANT
CLSR STERI-STRIP ANTIMIC 1/2X4 (GAUZE/BANDAGES/DRESSINGS) ×2 IMPLANT
CORDS BIPOLAR (ELECTRODE) ×2 IMPLANT
COVER SURGICAL LIGHT HANDLE (MISCELLANEOUS) ×2 IMPLANT
CRADLE DONUT ADULT HEAD (MISCELLANEOUS) ×2 IMPLANT
DEVICE ENDSKLTN TC MED 8MM (Orthopedic Implant) ×3 IMPLANT
DRAPE C-ARM 42X72 X-RAY (DRAPES) ×2 IMPLANT
DRAPE POUCH INSTRU U-SHP 10X18 (DRAPES) ×2 IMPLANT
DRAPE SURG 17X23 STRL (DRAPES) ×2 IMPLANT
DRAPE U-SHAPE 47X51 STRL (DRAPES) ×2 IMPLANT
DRSG MEPILEX BORDER 4X4 (GAUZE/BANDAGES/DRESSINGS) ×2 IMPLANT
DRSG MEPILEX BORDER 4X8 (GAUZE/BANDAGES/DRESSINGS) IMPLANT
DURAPREP 26ML APPLICATOR (WOUND CARE) ×2 IMPLANT
ELECT COATED BLADE 2.86 ST (ELECTRODE) ×2 IMPLANT
ELECT PENCIL ROCKER SW 15FT (MISCELLANEOUS) ×2 IMPLANT
ELECT REM PT RETURN 9FT ADLT (ELECTROSURGICAL) ×2
ELECTRODE REM PT RTRN 9FT ADLT (ELECTROSURGICAL) ×1 IMPLANT
ENDOSKELTON TC IMPLANT 8MM MED (Orthopedic Implant) ×6 IMPLANT
GLOVE BIOGEL PI IND STRL 8 (GLOVE) ×1 IMPLANT
GLOVE BIOGEL PI IND STRL 8.5 (GLOVE) ×1 IMPLANT
GLOVE BIOGEL PI INDICATOR 8 (GLOVE) ×1
GLOVE BIOGEL PI INDICATOR 8.5 (GLOVE) ×1
GLOVE ORTHO TXT STRL SZ7.5 (GLOVE) ×2 IMPLANT
GLOVE SS BIOGEL STRL SZ 8.5 (GLOVE) ×1 IMPLANT
GLOVE SUPERSENSE BIOGEL SZ 8.5 (GLOVE) ×1
GOWN STRL REUS W/ TWL XL LVL3 (GOWN DISPOSABLE) ×2 IMPLANT
GOWN STRL REUS W/TWL 2XL LVL3 (GOWN DISPOSABLE) ×4 IMPLANT
GOWN STRL REUS W/TWL XL LVL3 (GOWN DISPOSABLE) ×2
ILLUMINATOR WAVEGUIDE N/F (MISCELLANEOUS) ×4 IMPLANT
KIT BASIN OR (CUSTOM PROCEDURE TRAY) ×2 IMPLANT
KIT ROOM TURNOVER OR (KITS) ×2 IMPLANT
MIX DBX 10CC 35% BONE (Bone Implant) ×2 IMPLANT
NEEDLE SPNL 18GX3.5 QUINCKE PK (NEEDLE) ×2 IMPLANT
NS IRRIG 1000ML POUR BTL (IV SOLUTION) ×4 IMPLANT
PACK ORTHO CERVICAL (CUSTOM PROCEDURE TRAY) ×2 IMPLANT
PACK UNIVERSAL I (CUSTOM PROCEDURE TRAY) ×2 IMPLANT
PAD ARMBOARD 7.5X6 YLW CONV (MISCELLANEOUS) ×6 IMPLANT
PATTIES SURGICAL .25X.25 (GAUZE/BANDAGES/DRESSINGS) ×2 IMPLANT
PATTIES SURGICAL .5 X.5 (GAUZE/BANDAGES/DRESSINGS) IMPLANT
PIN DISTRACTION 14 (PIN) ×4 IMPLANT
PIN RETAINER PRODISC 14 MM (PIN) ×4 IMPLANT
PIN TEMP SKYLINE THREADED (PIN) ×2 IMPLANT
PLATE 3LVL 57M SWIFT PLUS CERV (Plate) ×2 IMPLANT
RESTRAINT LIMB HOLDER UNIV (RESTRAINTS) ×2 IMPLANT
SCREW SD-VA 14M SWIFT PLUS (Screw) ×12 IMPLANT
SCREW SELF DRILL SWIFT 16MM (Screw) ×4 IMPLANT
SPONGE INTESTINAL PEANUT (DISPOSABLE) ×6 IMPLANT
SPONGE LAP 4X18 X RAY DECT (DISPOSABLE) ×4 IMPLANT
SPONGE SURGIFOAM ABS GEL 100 (HEMOSTASIS) ×2 IMPLANT
SURGIFLO W/THROMBIN 8M KIT (HEMOSTASIS) ×2 IMPLANT
SUT BONE WAX W31G (SUTURE) ×2 IMPLANT
SUT MON AB 3-0 SH 27 (SUTURE) ×1
SUT MON AB 3-0 SH27 (SUTURE) ×1 IMPLANT
SUT SILK 2 0 (SUTURE) ×1
SUT SILK 2-0 18XBRD TIE 12 (SUTURE) ×1 IMPLANT
SUT VIC AB 2-0 CT1 18 (SUTURE) ×2 IMPLANT
SYR BULB IRRIGATION 50ML (SYRINGE) ×2 IMPLANT
SYR CONTROL 10ML LL (SYRINGE) ×2 IMPLANT
TAPE CLOTH 4X10 WHT NS (GAUZE/BANDAGES/DRESSINGS) ×2 IMPLANT
TAPE UMBILICAL COTTON 1/8X30 (MISCELLANEOUS) ×2 IMPLANT
TOWEL OR 17X24 6PK STRL BLUE (TOWEL DISPOSABLE) ×2 IMPLANT
TOWEL OR 17X26 10 PK STRL BLUE (TOWEL DISPOSABLE) ×2 IMPLANT
TRAY FOLEY CATH 16FRSI W/METER (SET/KITS/TRAYS/PACK) ×2 IMPLANT
WATER STERILE IRR 1000ML POUR (IV SOLUTION) IMPLANT

## 2015-05-12 NOTE — Transfer of Care (Signed)
Immediate Anesthesia Transfer of Care Note  Patient: Dennis Huffman  Procedure(s) Performed: Procedure(s): ANTERIOR CERVICAL DECOMPRESSION/DISCECTOMY FUSION C3 - C6 3 LEVELS (N/A)  Patient Location: PACU  Anesthesia Type:General  Level of Consciousness: awake, oriented and patient cooperative  Airway & Oxygen Therapy: Patient Spontanous Breathing and Patient connected to nasal cannula oxygen  Post-op Assessment: Report given to RN, Post -op Vital signs reviewed and stable and Patient moving all extremities  Post vital signs: Reviewed and stable  Last Vitals:  Filed Vitals:   05/12/15 1016  BP: 152/80  Pulse:   Temp:   Resp:     Complications: No apparent anesthesia complications

## 2015-05-12 NOTE — Discharge Instructions (Signed)

## 2015-05-12 NOTE — H&P (Signed)
History of Present Illness  The patient is a 65 year old male who comes in today for a preoperative History and Physical. The patient is scheduled for a ACDF C3-6 to be performed by Dr. Duane Lope D. Rolena Infante, MD at [facility] on 05/12/15 . Please see the hospital record for complete dictated history and physical.  Additional reasons for visit:  Neck pain is described as the following: The patient reports symptoms involving the entire neck which began 3 month(s) ago. 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 Symptoms include neck pain, numbness and tingling. The symptoms occur constantly (right thumb and some numbness in left hand pinky).The patient describes their symptoms as mild.The patient does feel that the symptoms are worsening. The patient is not currently being treated for this problem. Prior to being seen today the patient was previously evaluated in this clinic. Past evaluation has included cervical spine MRI (and brachial plexius MRI). This problem has not been previously treated. The pt is going down stairs to get his aspen collar and bone stimulator.  Allergies No Known Drug Allergies10/18/2016  Family History  Cerebrovascular Accident Sister.  Social History Tobacco use Former smoker. 12/14/2014: smoke(d) less than 1/2 pack(s) per day Tobacco / smoke exposure 12/14/2014: no Former drinker 12/14/2014: In the past drank beer and wine only occasionally per week Children 4 Exercise Exercises monthly; does gym / weights Current work status working full time Marital status single No history of drug/alcohol rehab Not under pain contract Number of flights of stairs before winded 2-3 Living situation live with parents  Medication History  Atorvastatin Calcium (10MG  Tablet, Oral) Active. Valsartan (160MG  Tablet, Oral) Active. Glucosamine Chondroitin Complx (Oral) Specific dose unknown -  Active. Medications Reconciled  Vitals Collie Siad M. Toomes; 05/03/2015 3:37 PM) 05/03/2015 3:36 PM Weight: 252 lb Height: 71in Body Surface Area: 2.33 m Body Mass Index: 35.15 kg/m  Temp.: 98.74F  Pulse: 60 (Regular)  BP: 162/105 (Sitting, Left Arm, Standard)  Physical Exam General General Appearance-Not in acute distress. Orientation-Oriented X3. Build & Nutrition-Well nourished and Well developed.  Integumentary General Characteristics Surgical Scars - no surgical scar evidence of previous cervical surgery. Cervical Spine-Skin examination of the cervical spine is without deformity, skin lesions, lacerations or abrasions.  Chest and Lung Exam Auscultation Breath sounds - Normal and Clear.  Cardiovascular Auscultation Rhythm - Regular rate and rhythm.  Abdomen Palpation/Percussion Palpation and Percussion of the abdomen reveal - Soft and Non Tender.  Peripheral Vascular Upper Extremity Palpation - Radial pulse - Bilateral - 2+.  Neurologic Sensation Upper Extremity - Left - sensation is intact in the upper extremity. Right - sensation is diminished in the upper extremity. Reflexes Biceps Reflex - Bilateral - 2+. Brachioradialis Reflex - Bilateral - 2+. Triceps Reflex - Bilateral - 2+. Hoffman's Sign - Bilateral - Hoffman's sign not present.  Musculoskeletal Spine/Ribs/Pelvis  Cervical Spine : Inspection and Palpation - Tenderness - right cervical paraspinals tender to palpation and left cervical paraspinals tender to palpation. Strength and Tone: Strength: Strength: Strength - Wrist Extension - Bilateral - 5/5. Deltoid - Right - 2-/5. Biceps - Left - 5/5. Right - 2-/5. Triceps - Left - 5/5. Right - 2-/5. Hand Grip - Bilateral - 5/5. Heel walk - Bilateral - able to heel walk without difficulty. Toe Walk - Bilateral - able to walk on toes without difficulty. Heel-Toe Walk - Bilateral - able to heel-toe walk without difficulty. ROM - Flexion -  Mildly  decreased and painful. Extension - Mildly decreased and painful. Left Lateral Flexion - Mildly decreased and painful. Right Lateral Flexion - Mildly decreased and painful. Left Rotation - Mildly decreased and painful. Right Rotation - Mildly decreased and painful. Pain - neither flexion or extension is more painful than the other. Cervical Spine - Special Testing - axial compression test negative, cross chest impingement test negative. Non-Anatomic Signs - No non-anatomic signs present. Upper Extremity Range of Motion - No truesholder pain with IR/ER of the shoulders.  IMAGES MRI of the brachial plexus, which showed no significant signal abnormality within the course of the right brachial plexus, but he does have spinal stenosis and cord impingement at C3-C4 with significant right-sided foraminal stenosis at C3-C4, C4-C5, and C5-C6. The cervical MRI was dated 03/17/2015. The brachial plexus MRI was dated 04/11/2015.  Plans Transcription At this point in time, the patient has profound C5-C6, C4 radiculopathy with sensory and motor deficits. After reviewing Dr. Susie Cassette note, I agree that contributing to this is his full-thickness rotator cuff tear, which is significantly hindering his left arm function. At this point, we are now about four months status post his work-related injury. 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 Based on the mechanism of injury, the patient now has an acute exacerbation of an underlying degenerative process. To address this, I would recommend a three-level anterior cervical discectomy and fusion at C3-C4, C4-C5, and C5-C6. This would allow me to remove the bone spur and decompress the nerve roots and give the arm the best chance of recovery. He has profound weakness, and there is a chance as I have explained to him that this does not improve. However, the best chance I have of giving him the best quality of life is with the three-level fusion. I discussed the risks  with him, which include infection, bleeding, nerve damage, death, stroke, paralysis, failure to heal, need for further surgery, ongoing or worse pain, loss of bowel and bladder control, need for supplemental posterior fixation, throat pain, swallowing difficulties, and hoarseness in the voice. All of his questions were addressed. We will plan on moving forward with surgery in the very near future given the severity of the neural compression and motor deficits and the duration of his existing problem. In addition, x-rays today show loss of cervical lordosis. No significant facet disease. Anterior traction spurs and degeneration at the C3-C4, C4-C5, and C5-C6 and to a less degree C6-C7 levels. At this point, clinically, I am quite concerned about the neural compression. I would still move forward with the three-level discectomy because it is a multilevel procedure and also needs an external bone stimulator for nine months following the fusion.  Goal Of Surgery:Discussed that goal of surgery is to reduce pain and improve function and quality of life. Patient is aware that despite all appropriate treatment that there pain and function could be the same, worse, or different.  Anterior cervical fusion:Risks of surgery include, but are not limited to: Throat pain, swallowing difficulty, hoarseness or change in voice, death, stroke, paralysis, nerve root damage/injury, bleeding, blood clots, loss of bowel/bladder control, hardware failure, or mal-position, spinal fluid leak, adjacent segment disease, non-union, need for further surgery, ongoing or worse pain, infection. Post-operative bleeding or swelling that could require emergent surgery.

## 2015-05-12 NOTE — Op Note (Signed)
NAMEKEYMARI, ZUEGE              ACCOUNT NO.:  [id number]  MEDICAL RECORD NO.:  [id number]  LOCATION:  5N10C                        FACILITY:  [facility]  PHYSICIAN:  Maelynn Moroney D. Rolena Infante, M.D. DATE OF BIRTH:  1950-12-18  DATE OF PROCEDURE:  05/12/2015 DATE OF DISCHARGE:                              OPERATIVE REPORT   PREOPERATIVE DIAGNOSIS:  Cervical radiculopathy with significant right upper extremity weakness, C3-4; C4-5; C5-6.  POSTOPERATIVE DIAGNOSIS:  Cervical radiculopathy with significant right upper extremity weakness, C3-4; C4-5; C5-6.  OPERATIVE PROCEDURE:  Anterior cervical diskectomy and fusion at C3-4, C4-5, C5-6.  COMPLICATIONS:  None.  INSTRUMENTATION SYSTEM USED:  DePuy Switft translational plate fixed with 16 mm screws into the body of C3 and 14 mm screws into the bodies of 4, 5, and 6 with an 8 medium Titan titanium lordotic cage at each level packed with DBX mix.  INTRAOPERATIVE FINDINGS:  Significant right lateral recess, right foraminal hard disk and soft disk herniation with significant uncovertebral joint spurs, all removed.  FIRST ASSISTANT:  Ronette Deter, [place].  HISTORY:  This is a very pleasant 64 year old gentleman who has had about 6 to 7 months of progressive right upper extremity weakness and loss of function after ruling out a brachial plexus injury.  He is referred to me after MRI of his C-spine were obtained.  There was significant foraminal stenosis and neural compression on the right side at 3-4, 4-5, and 5-6.  The patient had no significant triceps weakness, but he did have 4, 5, and 6 weakness (trapezius, deltoid, biceps, wrist extensors).  At this point in time because of the significant profound weakness and the failure to improve with conservative measures, we elected to proceed with surgery.  All appropriate risks, benefits, and alternatives were discussed with the patient and consent was obtained.  OPERATIVE NOTE:  The patient was brought  to the operating room, placed supine on the operating table.  After successful induction of general anesthesia and endotracheal intubation, TEDs, SCDs, and a Foley were inserted.  Rolled towels were placed.  An inflatable cuff was placed underneath the shoulder blades and restraints were placed on the wrist for intraoperative traction.  The anterior cervical spine was then prepped and draped in a standard fashion.  Time-out was taken to confirm patient, procedure, and all other pertinent important data.  Once this was done, a longitudinal incision was made over the left side of the neck starting at the midportion of C3 and proceeding down to the midportion of C6.  I sharply dissected down to the platysma and then incised the platysma.  I then sharply dissected in the deep cervical fascia along the medial border of the sternocleidomastoid and identified the omohyoid muscle.  The omohyoid muscle was sacrificed for better visualization.  I then continued to sharply dissect through the remaining deep cervical and prevertebral fascia until I could palpate the anterior longitudinal ligament.  I swept the esophagus and trachea over to the right and protected it with a retractor, identified the carotid sheath and protected that with my finger.  Using [employer], I continued to bluntly dissect through the remainder of the prevertebral fascia to expose the 3-4, 4-5, and 5-6  disk spaces.  I then placed a needle into the 3-4 disk space and took an intraoperative x-ray (fluoro view) and confirmed that I was at the appropriate level.  Once this was done, I used bipolar electrocautery to mobilize the longus colli muscles from the midbody of 3 to the midbody of C6.  Once this was done, a self-retaining retractor 55 mm in length were placed underneath the longus colli muscles.  The endotracheal cuff was deflated and I expanded the retractor to expose the 3-4 disk space.  I reinflated the cuff new  pressure.  An annulotomy was performed with a 15-blade scalpel and then using pituitary rongeurs, I removed the bulk of the 3-4 disk space.  I then trimmed down the anterior osteophytes from the inferior aspect of C3 vertebral body for better visualization of the disk space. Distraction pins were then placed into the body of 3 and 4 and I distracted the intervertebral space and maintained it with the distraction pin set.  I then continued posteriorly using neuro curettes to remove the disk material.  I then used a 1 mm Kerrison to trim down the posterior osteophytes from the vertebral bodies of 3 and 4.  I then used my black nerve hook to bluntly dissect through the posterior annulus and posterior longitudinal ligament.  Once I was in the plane between the thecal sac and PLL, I used my 1 mm Kerrison to resect the entire PLL.  I then undercut the uncovertebral joint especially on the right side and actually removed some small fragments of disk material. At this point, I had an adequate diskectomy at this level as well as decompression.  I rasped the endplates so I had bleeding subchondral bone and then measured the intervertebral space with trial devices.  I elected to use the 8 lordotic spacer, it just provided the best fit. The Titan 8 medium lordotic cage was packed with DBX mix and malleted to the appropriate depth.  Once I had satisfactory position of this, I then repositioned my distraction pins into the bodies of 6 and distracted the 4-5 and 5-6 disk space.  Using the same technique that I just utilized, I performed diskectomies at both these levels.  Again the PLL was taken down in a similar fashion and I removed the underlying osteophytes and disk fragments at the right hand side at 4-5 and 5-6 consistent with what was seen on the MRI.  Again bony endplates were rasped and the same size cages were placed.  At this point, x-rays were satisfactory.  The 3 cages were properly  positioned and I had an adequate indirect foraminal decompression.  I then contoured the translational DePuy anterior cervical plate and affixed it with self-drilling screws into the body of 3, 4, 5, and 6.  All screws had excellent purchase.  They were hand tightened down so they ended up below the bushing according to the manufacturer standards.  I then removed the translational breaks.  I then made sure the esophagus did not become entrapped beneath the plate and I examined it on the entire course of the implant.  I then returned the trachea and esophagus to midline.  I copiously irrigated with normal saline.  Hemostasis was achieved using bipolar electrocautery.  I then closed the platysma with interrupted 2-0 Vicryl suture and a 3-0 Monocryl for the skin.  Steri-Strips and a dry dressing were applied as was an Designer, multimedia.  The patient was ultimately extubated, transferred to the PACU without incident.  At the end of the case, all needle and sponge counts were correct.     Jamerius Boeckman D. Rolena Infante, M.D.     DDB/MEDQ  D:  05/12/2015  T:  05/12/2015  Job:  [id number]

## 2015-05-12 NOTE — Anesthesia Procedure Notes (Signed)
Procedure Name: Intubation Date/Time: 05/12/2015 11:25 AM Performed by: Melina Copa, Karolyne Timmons R Pre-anesthesia Checklist: Patient identified, Timeout performed, Emergency Drugs available, Suction available and Patient being monitored Patient Re-evaluated:Patient Re-evaluated prior to inductionOxygen Delivery Method: Circle system utilized Preoxygenation: Pre-oxygenation with 100% oxygen Intubation Type: IV induction Ventilation: Mask ventilation without difficulty Laryngoscope Size: Mac and 4 Grade View: Grade II Tube type: Oral Tube size: 8.0 mm Number of attempts: 1 Airway Equipment and Method: Stylet Placement Confirmation: ETT inserted through vocal cords under direct vision,  positive ETCO2 and breath sounds checked- equal and bilateral Secured at: 22 cm Tube secured with: Tape Dental Injury: Teeth and Oropharynx as per pre-operative assessment

## 2015-05-12 NOTE — Anesthesia Preprocedure Evaluation (Addendum)
Anesthesia Evaluation  Patient identified by MRN, date of birth, ID band Patient awake    Reviewed: Allergy & Precautions, NPO status , Patient's Chart, lab work & pertinent test results  Airway Mallampati: II  TM Distance: >3 FB Neck ROM: Full    Dental  (+) Teeth Intact, Dental Advisory Given   Pulmonary    breath sounds clear to auscultation       Cardiovascular hypertension, Pt. on medications  Rhythm:Regular Rate:Normal     Neuro/Psych    GI/Hepatic   Endo/Other    Renal/GU Renal InsufficiencyRenal disease     Musculoskeletal   Abdominal   Peds  Hematology   Anesthesia Other Findings   Reproductive/Obstetrics                            Anesthesia Physical Anesthesia Plan  ASA: III  Anesthesia Plan: General   Post-op Pain Management:    Induction: Intravenous  Airway Management Planned: Oral ETT  Additional Equipment: None  Intra-op Plan:   Post-operative Plan: Extubation in OR  Informed Consent: I have reviewed the patients History and Physical, chart, labs and discussed the procedure including the risks, benefits and alternatives for the proposed anesthesia with the patient or authorized representative who has indicated his/her understanding and acceptance.   Dental advisory given  Plan Discussed with: CRNA, Anesthesiologist and Surgeon  Anesthesia Plan Comments:         Anesthesia Quick Evaluation

## 2015-05-12 NOTE — Brief Op Note (Signed)
05/12/2015  3:40 PM  PATIENT:  Dennis Huffman  64 y.o. male  PRE-OPERATIVE DIAGNOSIS:  cervical spondyolic radiculopathy  POST-OPERATIVE DIAGNOSIS:  cervical spondyolic radiculopathy  PROCEDURE:  Procedure(s): ANTERIOR CERVICAL DECOMPRESSION/DISCECTOMY FUSION C3 - C6 3 LEVELS (N/A)  SURGEON:  Surgeon(s) and Role:    * Melina Schools, MD - Primary  PHYSICIAN ASSISTANT:   ASSISTANTS: Carmen Mayo   ANESTHESIA:   general  EBL:  Total I/O In: 1000 [I.V.:1000] Out: [id number] [Urine:700; Blood:75]  BLOOD ADMINISTERED:none  DRAINS: none   LOCAL MEDICATIONS USED:  MARCAINE     SPECIMEN:  No Specimen  DISPOSITION OF SPECIMEN:  N/A  COUNTS:  YES  TOURNIQUET:  * No tourniquets in log *  DICTATION: .Other Dictation: Dictation Number [id number]  PLAN OF CARE: Admit for overnight observation  PATIENT DISPOSITION:  PACU - hemodynamically stable.

## 2015-05-13 ENCOUNTER — Encounter (HOSPITAL_COMMUNITY): Payer: Self-pay | Admitting: Orthopedic Surgery

## 2015-05-13 DIAGNOSIS — M5011 Cervical disc disorder with radiculopathy,  high cervical region: Secondary | ICD-10-CM | POA: Diagnosis not present

## 2015-05-13 MED ORDER — ONDANSETRON HCL 4 MG/2ML IJ SOLN: 4.0000 mg | INTRAMUSCULAR | Status: DC | PRN

## 2015-05-13 MED ORDER — OXYCODONE HCL 10 MG PO TABS: 10.0000 mg | ORAL_TABLET | ORAL | Status: DC | PRN

## 2015-05-13 MED FILL — Thrombin For Soln 20000 Unit: CUTANEOUS | Qty: 1 | Status: AC

## 2015-05-13 NOTE — Clinical Social Work Note (Signed)
CSW consult acknowledged:  Clinical Education officer, museum received consult for SNF placement. PT currently recommending "no PT follow up".  Clinical Social Worker will sign off for now as social work intervention is no longer needed. Please consult [place] again if new need arises.  Glendon Axe, MSW, LCSWA [phone] 05/13/2015 3:10 PM

## 2015-05-13 NOTE — Evaluation (Addendum)
Physical Therapy Evaluation/Discharge Patient Details Name: Dennis Huffman MRN: [id number] DOB: 29-Aug-1950 Today's Date: 05/13/2015   History of Present Illness  Pt admitted for ACDF of C3-6 after a forklift accident 3 months ago. PMHx of HTN, CAD  Clinical Impression  PT evaluation revealed weakness and sensory deficits in UEs but pt reported it is still the same as before the ACDF. Discussed precautions with pt and educated him on appropriate exercises and modifications to daily activities for safety, handout was provided. Further PT treatment is not indicated at this time with all education completed and pt was agreeable to no further needs.    Follow Up Recommendations No PT follow up    Equipment Recommendations       Recommendations for Other Services       Precautions / Restrictions Precautions Precautions: Cervical Required Braces or Orthoses: Cervical Brace Cervical Brace: At all times      Mobility  Bed Mobility Overal bed mobility: Independent       Supine to sit: Independent Sit to supine: Independent   General bed mobility comments: Pt just required education on proper technique  Transfers Overall transfer level: Independent                  Ambulation/Gait Ambulation/Gait assistance: Independent Ambulation Distance (Feet): 500 Feet       Gait velocity interpretation: at or above normal speed for age/gender General Gait Details: No deficits during amb  Stairs            Wheelchair Mobility    Modified Rankin (Stroke Patients Only)       Balance Overall balance assessment: Independent                                           Pertinent Vitals/Pain Pain Assessment: No/denies pain    Home Living Family/patient expects to be discharged to:: Private residence Living Arrangements: Other relatives Available Help at Discharge: Family;Available 24 hours/day Type of Home: House       Home Layout: One  level;Two level;Able to live on main level with bedroom/bathroom Home Equipment: Cane - single point      Prior Function Level of Independence: Independent               Hand Dominance        Extremity/Trunk Assessment   Upper Extremity Assessment: RUE deficits/detail;LUE deficits/detail RUE Deficits / Details: Weakness with GH ABD and elbow flexion     LUE Deficits / Details: Paresthesia in ulnar nerve distribution, pt reported it was from previous incident   Lower Extremity Assessment: Overall WFL for tasks assessed      Cervical / Trunk Assessment: Normal  Communication   Communication: No difficulties  Cognition Arousal/Alertness: Awake/alert Behavior During Therapy: WFL for tasks assessed/performed Overall Cognitive Status: Within Functional Limits for tasks assessed                      General Comments      Exercises        Assessment/Plan    PT Assessment Patent does not need any further PT services  PT Diagnosis     PT Problem List    PT Treatment Interventions     PT Goals (Current goals can be found in the Care Plan section)      Frequency     Barriers to  discharge        Co-evaluation               End of Session Equipment Utilized During Treatment: Gait belt;Cervical collar Activity Tolerance: Patient tolerated treatment well Patient left: in chair;with call bell/phone within reach      Functional Assessment Tool Used: clinical judgement Functional Limitation: Mobility: Walking and moving around Mobility: Walking and Moving Around Current Status [phone]): 0 percent impaired, limited or restricted Mobility: Walking and Moving Around Goal Status [phone]): 0 percent impaired, limited or restricted Mobility: Walking and Moving Around Discharge Status [phone]): 0 percent impaired, limited or restricted    Time: 1135-1154 PT Time Calculation (min) (ACUTE ONLY): 19 min   Charges:   PT Evaluation $Initial PT Evaluation  Tier I: 1 Procedure     PT G Codes:   PT G-Codes **NOT FOR INPATIENT CLASS** Functional Assessment Tool Used: clinical judgement Functional Limitation: Mobility: Walking and moving around Mobility: Walking and Moving Around Current Status [id number]): 0 percent impaired, limited or restricted Mobility: Walking and Moving Around Goal Status [id number]): 0 percent impaired, limited or restricted Mobility: Walking and Moving Around Discharge Status [id number]): 0 percent impaired, limited or restricted    Haynes Bast 05/13/2015, 2:00 PM Haynes Bast, SPT 05/13/2015 2:00 PM

## 2015-05-13 NOTE — Anesthesia Postprocedure Evaluation (Signed)
Anesthesia Post Note  Patient: Dennis Huffman  Procedure(s) Performed: Procedure(s) (LRB): ANTERIOR CERVICAL DECOMPRESSION/DISCECTOMY FUSION C3 - C6 3 LEVELS (N/A)  Anesthesia type: General  Patient location: PACU  Post pain: Pain level controlled and Adequate analgesia  Post assessment: Post-op Vital signs reviewed, Patient's Cardiovascular Status Stable, Respiratory Function Stable, Patent Airway and Pain level controlled  Last Vitals:  Filed Vitals:   05/13/15 0436  BP: 162/82  Pulse: 93  Temp: 37.4 C  Resp: 18    Post vital signs: Reviewed and stable  Level of consciousness: awake, alert  and oriented  Complications: No apparent anesthesia complications

## 2015-05-13 NOTE — Progress Notes (Signed)
OT Cancellation Note  Patient Details Name: Dennis Huffman MRN: [id number] DOB: Apr 14, 1951   Cancelled Treatment:    Reason Eval/Treat Not Completed: OT screened, no needs identified, will sign off. Per PT, pt reports he is mod I with ADLs and has 24/7 assist upon d/c home. PT reports reviewing precautions during functional activities; pt with no questions or concerns. No acute OT needs identified; will sign off at this time. Thank you for this referral.   Binnie Kand M.S., OTR/L Pager: [phone]  05/13/2015, 12:01 PM

## 2015-05-16 NOTE — Discharge Summary (Signed)
Patient ID: Dennis Huffman, male   DOB: October 14, 1950, 64 y.o.   MRN: [id number] Physician Discharge Summary  Patient ID: Dennis Huffman MRN: [id number] DOB/AGE: 10-24-1950 64 y.o.  Admit date: 05/12/2015 Discharge date: 05/16/2015  Admission Diagnoses:  Cervical DDD and right arm weakness  Discharge Diagnoses:  Active Problems:   Neck pain   Past Medical History  Diagnosis Date  . Hypercholesteremia   . Hypertension   . Arthritis     cervical spondylotic, radiculopathy  . Chronic kidney disease, stage III (moderate)     Surgeries: Procedure(s): ANTERIOR CERVICAL DECOMPRESSION/DISCECTOMY FUSION C3 - C6 3 LEVELS on 05/12/2015   Consultants (if any):    Discharged Condition: Improved  Hospital Course: Dennis Huffman is an 64 y.o. male who was admitted 05/12/2015 with a diagnosis of Cervical DDD and right arm weakness and went to the operating room on 05/12/2015 and underwent the above named procedures.  The pt was discharged on 05/13/15.  His hospital course was uneventful.   He was given perioperative antibiotics:  Anti-infectives    Start     Dose/Rate Route Frequency Ordered Stop   05/12/15 2000  ceFAZolin (ANCEF) IVPB 1 g/50 mL premix     1 g 100 mL/hr over 30 Minutes Intravenous Every 8 hours 05/12/15 1904 05/13/15 0438   05/12/15 1230  ceFAZolin (ANCEF) IVPB 2 g/50 mL premix     2 g 100 mL/hr over 30 Minutes Intravenous To [facility] 05/11/15 1345 05/12/15 1140    .  He was given sequential compression devices, early ambulation, and TED for DVT prophylaxis.  He benefited maximally from the hospital stay and there were no complications.    Recent vital signs:  Filed Vitals:   05/13/15 0436 05/13/15 1226  BP: 162/82 150/94  Pulse: 93 85  Temp: 99.3 F (37.4 C) 98.7 F (37.1 C)  Resp: 18 18    Recent laboratory studies:  Lab Results  Component Value Date   HGB 14.0 05/09/2015   HGB 15.1 08/06/2014   Lab Results  Component Value Date   WBC  5.8 05/09/2015   PLT 172 05/09/2015   No results found for: INR Lab Results  Component Value Date   NA 140 05/09/2015   K 3.9 05/09/2015   CL 108 05/09/2015   CO2 24 05/09/2015   BUN 19 05/09/2015   CREATININE 1.64* 05/09/2015   GLUCOSE 108* 05/09/2015    Discharge Medications:     Medication List    STOP taking these medications        ibuprofen 200 MG tablet  Commonly known as:  ADVIL,MOTRIN     ondansetron 8 MG disintegrating tablet  Commonly known as:  ZOFRAN ODT      TAKE these medications        atorvastatin 10 MG tablet  Commonly known as:  LIPITOR  Take 10 mg by mouth daily.     Glucosamine-Chondroitin-MSM 500-400-167 MG Tabs  Take 3 tablets by mouth daily.     methocarbamol 500 MG tablet  Commonly known as:  ROBAXIN  Take 1 tablet (500 mg total) by mouth 3 (three) times daily as needed for muscle spasms.     ondansetron 4 MG tablet  Commonly known as:  ZOFRAN  Take 1 tablet (4 mg total) by mouth every 8 (eight) hours as needed for nausea or vomiting.     ondansetron 4 MG/2ML Soln injection  Commonly known as:  ZOFRAN  Inject 2 mLs (4 mg total) into  the vein every 4 (four) hours as needed for nausea or vomiting.     Oxycodone HCl 10 MG Tabs  Take 1 tablet (10 mg total) by mouth every 4 (four) hours as needed for severe pain.     oxyCODONE-acetaminophen 10-325 MG tablet  Commonly known as:  PERCOCET  Take 1 tablet by mouth every 4 (four) hours as needed for pain.     valsartan 160 MG tablet  Commonly known as:  DIOVAN  Take 80 mg by mouth daily. 1/2 tablet daily        Diagnostic Studies: Dg Cervical Spine 2 Or 3 Views  05/12/2015  CLINICAL DATA:  Status post cervical fusion EXAM: CERVICAL SPINE - 2 VIEW COMPARISON:  Intra op films from earlier in the same day FINDINGS: Postsurgical changes are noted from C3-C6 with interbody fusion and anterior fixation. No acute bony abnormality is seen. IMPRESSION: Postsurgical changes without acute  abnormality. Electronically Signed   By: Inez Catalina M.D.   On: 05/12/2015 16:28   Dg Cervical Spine 2-3 Views  05/12/2015  CLINICAL DATA:  ACDF C3-C6 EXAM: CERVICAL SPINE - 2-3 VIEW; DG C-ARM GT 120 MIN COMPARISON:  None FLUOROSCOPY TIME:  33 seconds Two images FINDINGS: Two intraoperative fluoroscopic spot images of the cervical spine. Anterior cervical fusion at C3-C6 with plate and screw fixation and interbody spacer devices without failure or complication. Normal alignment. IMPRESSION: ACDF C3-C6. Electronically Signed   By: Kathreen Devoid   On: 05/12/2015 15:56   Dg C-arm Gt 120 Min  05/12/2015  CLINICAL DATA:  ACDF C3-C6 EXAM: CERVICAL SPINE - 2-3 VIEW; DG C-ARM GT 120 MIN COMPARISON:  None FLUOROSCOPY TIME:  33 seconds Two images FINDINGS: Two intraoperative fluoroscopic spot images of the cervical spine. Anterior cervical fusion at C3-C6 with plate and screw fixation and interbody spacer devices without failure or complication. Normal alignment. IMPRESSION: ACDF C3-C6. Electronically Signed   By: Kathreen Devoid   On: 05/12/2015 15:56    Disposition: 01-Home or Self Care        Follow-up Information    Follow up with Dahlia Bailiff, MD. Schedule an appointment as soon as possible for a visit in 2 weeks.   Specialty:  Orthopedic Surgery   Why:  If symptoms worsen, For suture removal, For wound re-check   Contact information:   [address] [place] [address] [phone]        Signed: Valinda Hoar 05/16/2015, 12:33 PM

## 2016-01-17 ENCOUNTER — Encounter: Payer: Self-pay | Admitting: Interventional Cardiology

## 2016-01-17 ENCOUNTER — Ambulatory Visit (INDEPENDENT_AMBULATORY_CARE_PROVIDER_SITE_OTHER): Payer: BLUE CROSS/BLUE SHIELD | Admitting: Interventional Cardiology

## 2016-01-17 VITALS — BP 130/90 | HR 60 | Ht 71.0 in | Wt 255.0 lb

## 2016-01-17 DIAGNOSIS — R002 Palpitations: Secondary | ICD-10-CM | POA: Diagnosis not present

## 2016-01-17 DIAGNOSIS — R42 Dizziness and giddiness: Secondary | ICD-10-CM

## 2016-01-17 DIAGNOSIS — I10 Essential (primary) hypertension: Secondary | ICD-10-CM | POA: Diagnosis not present

## 2016-01-17 DIAGNOSIS — E785 Hyperlipidemia, unspecified: Secondary | ICD-10-CM

## 2016-01-17 NOTE — Patient Instructions (Signed)
Medication Instructions:  Same-no changes  Labwork: None  Testing/Procedures: Your physician has recommended that you wear a 24 hour holter monitor. Holter monitors are medical devices that record the heart's electrical activity. Doctors most often use these monitors to diagnose arrhythmias. Arrhythmias are problems with the speed or rhythm of the heartbeat. The monitor is a small, portable device. You can wear one while you do your normal daily activities. This is usually used to diagnose what is causing palpitations/syncope (passing out).   Follow-Up: Based on test.     If you need a refill on your cardiac medications before your next appointment, please call your pharmacy.

## 2016-01-17 NOTE — Progress Notes (Signed)
Cardiology Office Note   Date:  01/17/2016   ID:  Dennis Huffman, DOB April 28, 1951, MRN [id number]  PCP:  [name], MD    No chief complaint on file. Palpitations/dizziness   Wt Readings from Last 3 Encounters:  01/17/16 255 lb (115.7 kg)  05/12/15 260 lb (117.9 kg)  05/09/15 260 lb 6.4 oz (118.1 kg)       History of Present Illness: Dennis Huffman is a 65 y.o. male  ho had arm pain and was given hydrocodone in 2014.  At that time, he he noticed some palpitations so he stopped the medicine.  The palpitations then resolved.   ECG was done with Dr. Lysle Rubens and his HR was slow, per his report 40 bpm.  Due to his age, he was referred here to be evaluated for bradycardia and was seen in 2015.  He had a negative w/u at that time.   He exercises regularly.  He does the treadmill and can get his HR up to 135.  No CP, SHOB, or syncope. Reports some mild dizziness.  Having some occasional skipped beats sensation.  It has decreased.  Was associated with hydrocodone in 2015 and resolved when he stopped hydrocodone.  Occasional dizziness when he bends down.    He has some numbness in his hands and attributes that to a herniated disk.  He has had two surgeries in the last 7 months.  He had disk surgery on his neck and then he had PT after this.  He then had right shoulder surgery.  He avoids the pain meds now.      Past Medical History:  Diagnosis Date  . Arthritis    cervical spondylotic, radiculopathy  . Chronic kidney disease, stage III (moderate)   . Hypercholesteremia   . Hypertension     Past Surgical History:  Procedure Laterality Date  . ANTERIOR CERVICAL DECOMP/DISCECTOMY FUSION N/A 05/12/2015   Procedure: ANTERIOR CERVICAL DECOMPRESSION/DISCECTOMY FUSION C3 - C6 3 LEVELS;  Surgeon: Melina Schools, MD;  Location: [facility];  Service: Orthopedics;  Laterality: N/A;  . colonscopy       Current Outpatient Prescriptions  Medication Sig Dispense Refill  . atorvastatin  (LIPITOR) 10 MG tablet Take 10 mg by mouth daily.    . Glucosamine-Chondroitin-MSM [id number] MG TABS Take 3 tablets by mouth daily.    . valsartan (DIOVAN) 160 MG tablet Take 80 mg by mouth daily. 1/2 tablet daily     No current facility-administered medications for this visit.     Allergies:   Hydrocodone    Social History:  The patient  reports that he has never smoked. He does not have any smokeless tobacco history on file. He reports that he drinks alcohol. He reports that he does not use drugs.   Family History:  The patient's family history includes Stroke in his sister.    ROS:  Please see the history of present illness.   Otherwise, review of systems are positive for skipped beats, hand numbness.   All other systems are reviewed and negative.    PHYSICAL EXAM: VS:  BP 130/90   Pulse 60   Ht 5\' 11"  (1.803 m)   Wt 255 lb (115.7 kg)   BMI 35.57 kg/m  , BMI Body mass index is 35.57 kg/m. GEN: Well nourished, well developed, in no acute distress  HEENT: normal  Neck: no JVD, carotid bruits, or masses Cardiac: RRR; no murmurs, rubs, or gallops,no edema  Respiratory:  clear to auscultation bilaterally,  normal work of breathing GI: soft, nontender, nondistended, + BS MS: no deformity or atrophy  Skin: warm and dry, no rash Neuro:  Strength and sensation are intact Psych: euthymic mood, full affect   EKG:   The prior ekg  demonstrates NSR, no ST segment changes   Recent Labs: 05/09/2015: BUN 19; Creatinine, Ser 1.64; Hemoglobin 14.0; Platelets 172; Potassium 3.9; Sodium 140   Lipid Panel No results found for: CHOL, TRIG, HDL, CHOLHDL, VLDL, LDLCALC, LDLDIRECT   Other studies Reviewed: Additional studies/ records that were reviewed today with results demonstrating: prior ECG showing no ST changes.   ASSESSMENT AND PLAN:  1. Palpitations/bradycardia: May be related to premature beats.  Given the dizziness, plan for a monitor. 2. Hyperlipidemia: FOllowed by  PMD.Continue atorvastatin.LDL 114 in 5/17. 3. Dizziness: More likely Related to change of position. He needs to stay well hydrated. Occurs when becoming upright from bending down. 4. Hypertension: Blood pressure controlled.   Current medicines are reviewed at length with the patient today.  The patient concerns regarding his medicines were addressed.  The following changes have been made:  No change  Labs/ tests ordered today include: Holter  No orders of the defined types were placed in this encounter.   Recommend 150 minutes/week of aerobic exercise Low fat, low carb, high fiber diet recommended  Disposition:   FU in when necessary, depending on monitor result   Signed, Larae Grooms, MD  01/17/2016 9:25 AM    [address], [place], [place]  [address] Phone: [phone]; Fax: [phone]

## 2016-01-19 ENCOUNTER — Ambulatory Visit (INDEPENDENT_AMBULATORY_CARE_PROVIDER_SITE_OTHER): Payer: BLUE CROSS/BLUE SHIELD

## 2016-01-19 DIAGNOSIS — R002 Palpitations: Secondary | ICD-10-CM

## 2016-02-29 ENCOUNTER — Telehealth: Payer: Self-pay | Admitting: Interventional Cardiology

## 2016-02-29 NOTE — Telephone Encounter (Signed)
See Holter Monitor results from 01/19/16.

## 2016-02-29 NOTE — Telephone Encounter (Signed)
New Message  Pt states he received a call from RN a couple of days ago. And states he was returning the call. I did not find a phone message. Please call back to discuss

## 2016-03-07 ENCOUNTER — Ambulatory Visit: Payer: Self-pay | Admitting: Interventional Cardiology

## 2016-08-02 ENCOUNTER — Other Ambulatory Visit (HOSPITAL_COMMUNITY): Payer: Self-pay | Admitting: Urology

## 2016-08-02 DIAGNOSIS — C61 Malignant neoplasm of prostate: Secondary | ICD-10-CM

## 2016-08-07 ENCOUNTER — Encounter (HOSPITAL_COMMUNITY)
Admission: RE | Admit: 2016-08-07 | Discharge: 2016-08-07 | Disposition: A | Discharge status: Home / Self Care | Payer: BLUE CROSS/BLUE SHIELD | Source: Ambulatory Visit | Attending: Urology | Admitting: Urology

## 2016-08-07 DIAGNOSIS — C61 Malignant neoplasm of prostate: Secondary | ICD-10-CM | POA: Diagnosis present

## 2016-08-07 MED ORDER — TECHNETIUM TC 99M MEDRONATE IV KIT
23.0000 | PACK | Freq: Once | INTRAVENOUS | Status: AC | PRN
  Administered 2016-08-07: 23 via INTRAVENOUS

## 2016-08-28 ENCOUNTER — Encounter: Payer: Self-pay | Admitting: Radiation Oncology

## 2016-08-29 ENCOUNTER — Encounter: Payer: Self-pay | Admitting: Radiation Oncology

## 2016-08-29 DIAGNOSIS — C61 Malignant neoplasm of prostate: Secondary | ICD-10-CM | POA: Insufficient documentation

## 2016-08-29 NOTE — Progress Notes (Addendum)
GU Location of Tumor / Histology: prostatic adenocarcinoma  If Prostate Cancer, Gleason Score is (4 + 4) and PSA is (7.35). 07/09/16 PSA (7.14). Prostate volume: 26 cc.  Benard Rink was referred to Dr. Karsten Ro by Dr. Lysle Rubens for further evaluation of an elevated PSA.   Biopsies of prostate (if applicable) revealed:    Past/Anticipated interventions by urology, if any: biopsy, referral to radiation oncology  Past/Anticipated interventions by medical oncology, if any: no  Weight changes, if any: no  Bowel/Bladder complaints, if any: IPSS 5 with urgency. Reports occasional leakage associated with urgency. Denies dysuria or hematuria.   Nausea/Vomiting, if any: no  Pain issues, if any:  Reports tingling in his hands related to herniated cervical disc. Reports intermittent pain related to back and right shoulder surgery. Patient has limited ROM of right arm following two shoulder surgeries.  SAFETY ISSUES:  Prior radiation? No  Pacemaker/ICD? No  Possible current pregnancy? no  Is the patient on methotrexate? No  Current Complaints / other details:  66 year old male. Single. One daughter and three sons. Works [employer].

## 2016-08-30 ENCOUNTER — Encounter: Payer: Self-pay | Admitting: Radiation Oncology

## 2016-08-30 ENCOUNTER — Ambulatory Visit
Admission: RE | Admit: 2016-08-30 | Discharge: 2016-08-30 | Disposition: A | Discharge status: Home / Self Care | Payer: BLUE CROSS/BLUE SHIELD | Source: Ambulatory Visit | Attending: Radiation Oncology | Admitting: Radiation Oncology

## 2016-08-30 ENCOUNTER — Encounter: Payer: Self-pay | Admitting: Medical Oncology

## 2016-08-30 DIAGNOSIS — N183 Chronic kidney disease, stage 3 (moderate): Secondary | ICD-10-CM | POA: Insufficient documentation

## 2016-08-30 DIAGNOSIS — C61 Malignant neoplasm of prostate: Secondary | ICD-10-CM | POA: Diagnosis not present

## 2016-08-30 DIAGNOSIS — Z823 Family history of stroke: Secondary | ICD-10-CM | POA: Diagnosis not present

## 2016-08-30 DIAGNOSIS — Z79899 Other long term (current) drug therapy: Secondary | ICD-10-CM | POA: Insufficient documentation

## 2016-08-30 DIAGNOSIS — Z885 Allergy status to narcotic agent status: Secondary | ICD-10-CM | POA: Insufficient documentation

## 2016-08-30 DIAGNOSIS — E7800 Pure hypercholesterolemia, unspecified: Secondary | ICD-10-CM | POA: Diagnosis not present

## 2016-08-30 DIAGNOSIS — Z803 Family history of malignant neoplasm of breast: Secondary | ICD-10-CM | POA: Insufficient documentation

## 2016-08-30 DIAGNOSIS — Z510 Encounter for antineoplastic radiation therapy: Secondary | ICD-10-CM | POA: Insufficient documentation

## 2016-08-30 DIAGNOSIS — I129 Hypertensive chronic kidney disease with stage 1 through stage 4 chronic kidney disease, or unspecified chronic kidney disease: Secondary | ICD-10-CM | POA: Diagnosis not present

## 2016-08-30 DIAGNOSIS — Z981 Arthrodesis status: Secondary | ICD-10-CM | POA: Insufficient documentation

## 2016-08-30 DIAGNOSIS — Z87891 Personal history of nicotine dependence: Secondary | ICD-10-CM | POA: Diagnosis not present

## 2016-08-30 DIAGNOSIS — Z7982 Long term (current) use of aspirin: Secondary | ICD-10-CM | POA: Insufficient documentation

## 2016-08-30 HISTORY — DX: Malignant neoplasm of prostate: C61

## 2016-08-30 NOTE — Progress Notes (Signed)
See progress note under physician encounter. 

## 2016-08-30 NOTE — Progress Notes (Signed)
Radiation Oncology         [phone] ________________________________  Initial Outpatient Consultation  Name: BRAYLAN FAUL MRN: [id number]  Date: 08/30/2016  DOB: 07/04/1950  AO:ZHYQMV,HQIONG, MD  [name], MD   REFERRING PHYSICIAN: [name], MD  DIAGNOSIS: 66 y.o. gentleman with Stage T1c adenocarcinoma of the prostate with Gleason Score of 4+4=8, and PSA of 7.14    ICD-9-CM ICD-10-CM   1. Prostate cancer [facility]) 185 C61     HISTORY OF PRESENT ILLNESS: ANGELL HONSE is a 66 y.o. male with a diagnosis of prostate cancer. He was noted to have an elevated PSA of 7.35 by his primary care physician, Dr. Deforest Hoyles. Prior, his PSA in 02/2010 was 4.17.  Accordingly, he was referred for evaluation in urology by Dr. Karsten Ro on 07/09/16,  digital rectal examination was performed at that time revealing normal consistency bilaterally without nodules or induration and a repeat PSA that day was 7.14.  The patient proceeded to transrectal ultrasound with 12 biopsies of the prostate on 07/26/16.  The prostate volume measured 26 cc.  Out of 12 core biopsies, 5 were positive. The maximum Gleason score was 4+4=8, and this was seen in the right apex involving 50% of 1 core. He was also noted to have 3+4=7 in the left apex and 3+3=6 in the right base lateral, left base lateral, and left apex lateral.   The patient underwent subsequent imaging for further disease staging. CT scan of the pelvis without contrast on 08/07/16 revealed a mildly enlarged 1.0 cm right external iliac node which was felt to be indeterminate. The prostate measured 3.4 x 4.1 x 3.9 cm (volume = 28 cc) with no discrete prostatic mass. Bone scan the same day showed no convincing metastatic disease to the bone with degenerative uptake noted in multiple areas.  Dr. Karsten Ro discussed surgery or radiation with androgen deprivation as treatment options. The patient declined surgery. Therefore Dr. Karsten Ro has started the patient on Lupron a  few days ago and he has kindly been referred today for discussion of potential radiation treatment options.  07/09/2016: 7.14 05/08/2016: 7.35 11/04/2015: 5.40 04/22/2014: 5.11 10/14/2013: 4.58 04/15/2013: 4.43 09/07/2010: 3.26 07/12/2010: 3.15   PREVIOUS RADIATION THERAPY: No  PAST MEDICAL HISTORY:  Past Medical History:  Diagnosis Date  . Arthritis    cervical spondylotic, radiculopathy  . Chronic kidney disease, stage III (moderate)   . Hypercholesteremia   . Hypertension   . Prostate cancer ([place])       PAST SURGICAL HISTORY: Past Surgical History:  Procedure Laterality Date  . ANTERIOR CERVICAL DECOMP/DISCECTOMY FUSION N/A 05/12/2015   Procedure: ANTERIOR CERVICAL DECOMPRESSION/DISCECTOMY FUSION C3 - C6 3 LEVELS;  Surgeon: Melina Schools, MD;  Location: [place];  Service: Orthopedics;  Laterality: N/A;  . colonscopy    . PROSTATE BIOPSY      FAMILY HISTORY:  Family History  Problem Relation Age of Onset  . Stroke Sister   . Cancer Sister     breast    SOCIAL HISTORY:  Social History   Social History  . Marital status: Single    Spouse name: N/A  . Number of children: N/A  . Years of education: N/A   Occupational History  . Not on file.   Social History Main Topics  . Smoking status: Former Smoker    Quit date: 06/26/1971  . Smokeless tobacco: Never Used  . Alcohol use Yes     Comment: socially   . Drug use: No  . Sexual  activity: Yes   Other Topics Concern  . Not on file   Social History Narrative  . No narrative on file    ALLERGIES: Hydrocodone  MEDICATIONS:  Current Outpatient Prescriptions  Medication Sig Dispense Refill  . aspirin EC 81 MG tablet Take 81 mg by mouth daily.    [name] atorvastatin (LIPITOR) 10 MG tablet Take 10 mg by mouth daily.    . Glucosamine-Chondroitin-MSM [phone] MG TABS Take 3 tablets by mouth daily.    . NON FORMULARY OTC prostate supplement    . valsartan (DIOVAN) 160 MG tablet Take 80 mg by mouth daily. 1/2  tablet daily     No current facility-administered medications for this encounter.     REVIEW OF SYSTEMS:  On review of systems, the patient reports that He is doing well overall. He denies any chest pain, shortness of breath, cough, fevers, chills, night sweats, or unintended weight changes. He denies any bowel disturbances, abdominal pain, nausea or vomiting. He reports tingling in his hands related to a herniated cervical disc. He has intermittent pain related to back and right shoulder surgery. The patient has limited ROM of right arm following two shoulder surgeries. The patient reports fatigue attributed to his ADT injection. His IPSS was 5, indicating mild urinary symptoms. He has urinary urgency and occasional leakage associated with urgency. He denies dysuria or hematuria. SHIM score of 23 He is able to complete sexual activity with most-to all attempts. A complete review of systems is obtained and is otherwise negative.    PHYSICAL EXAM:  Wt Readings from Last 3 Encounters:  08/30/16 246 lb (111.6 kg)  01/17/16 255 lb (115.7 kg)  05/12/15 260 lb (117.9 kg)   Temp Readings from Last 3 Encounters:  05/13/15 98.7 F (37.1 C)  05/09/15 98.6 F (37 C)  08/07/14 98.7 F (37.1 C) (Oral)   BP Readings from Last 3 Encounters:  08/30/16 (!) 149/77  01/17/16 130/90  05/13/15 (!) 150/94   Pulse Readings from Last 3 Encounters:  08/30/16 (!) 49  01/17/16 60  05/13/15 85   Pain Assessment Pain Score: 0-No pain/10  In general this is a well appearing African-American male in no acute distress who appears younger than his stated age. He is alert and oriented x4 and appropriate throughout the examination. HEENT reveals that the patient is normocephalic, atraumatic. EOMs are intact. PERRLA. Skin is intact without any evidence of gross lesions. Cardiovascular exam reveals a regular rate and rhythm, no clicks rubs or murmurs are auscultated. Chest is clear to auscultation bilaterally.  Lymphatic assessment is performed and does not reveal any adenopathy in the cervical, supraclavicular, or axillary chains. Abdomen has active bowel sounds in all quadrants and is intact. The abdomen is soft, non tender, non distended. Lower extremities are negative for pretibial pitting edema, deep calf tenderness, cyanosis or clubbing.   KPS = 100  100 - Normal; no complaints; no evidence of disease. 90   - Able to carry on normal activity; minor signs or symptoms of disease. 80   - Normal activity with effort; some signs or symptoms of disease. 87   - Cares for self; unable to carry on normal activity or to do active work. 60   - Requires occasional assistance, but is able to care for most of his personal needs. 50   - Requires considerable assistance and frequent medical care. 72   - Disabled; requires special care and assistance. 30   - Severely disabled; hospital admission is indicated  although death not imminent. 20   - Very sick; hospital admission necessary; active supportive treatment necessary. 10   - Moribund; fatal processes progressing rapidly. 0     - Dead  Karnofsky DA, Abelmann [place], Craver LS and Burchenal [facility] [phone]) The use of the nitrogen mustards in the palliative treatment of carcinoma: with particular reference to bronchogenic carcinoma Cancer 1 634-56  LABORATORY DATA:  Lab Results  Component Value Date   WBC 5.8 05/09/2015   HGB 14.0 05/09/2015   HCT 39.5 05/09/2015   MCV 78.7 05/09/2015   PLT 172 05/09/2015   Lab Results  Component Value Date   NA 140 05/09/2015   K 3.9 05/09/2015   CL 108 05/09/2015   CO2 24 05/09/2015   Lab Results  Component Value Date   ALT 27 08/06/2014   AST 31 08/06/2014   ALKPHOS 42 08/06/2014   BILITOT 2.0 (H) 08/06/2014     RADIOGRAPHY: Nm Bone Scan Whole Body  Result Date: 08/07/2016 CLINICAL DATA:  History of prostate carcinoma. PSA of 7.14 from 07/09/2016. No current bone pain. No history of recent fractures or trauma.  Rotator cuff surgery a few months ago. Previous cervical fusion dated 05/12/2015. EXAM: NUCLEAR MEDICINE WHOLE BODY BONE SCAN TECHNIQUE: Whole body anterior and posterior images were obtained approximately 3 hours after intravenous injection of radiopharmaceutical. RADIOPHARMACEUTICALS:  23.0 mCi Technetium-59m MDP IV COMPARISON:  Pelvis CT, 08/07/2016. FINDINGS: Uptake is seen involving the right humeral head that is likely reactive, assuming that this is the site of the prior rotator cuff surgery. There is irregular uptake involving the left acetabulum. Milder uptake involves the right hip and left shoulder. There is also uptake involving both knees, the wrists and both feet and ankles. These areas of uptake are most likely degenerative. Arthropathic changes of both hips is evident on the current pelvis CT. There is also mild uptake involving the sternoclavicular joints and along the dorsal thoracolumbar spine, which is felt to be degenerative. Renal uptake is symmetric. IMPRESSION: 1. No convincing metastatic disease to bone. 2. Reactive uptake involving the right shoulder presumably from the reported recent rotator cuff surgery. 3. Multiple areas of degenerative uptake noted involving the shoulders, sternoclavicular joints, hips, left greater than right, knees, feet and ankles and wrists as well as the thoracolumbar spine. Electronically Signed   By: Lajean Manes M.D.   On: 08/07/2016 15:49      IMPRESSION/PLAN: 1. 66 y.o. gentleman with Stage T1c adenocarcinoma of the prostate with Gleason Score of 4+4, and PSA of 7.14  His T-Stage, Gleason's Score, and PSA put him into the high risk group.  Accordingly he is eligible for external beam radiation with long term androgen depravation therapy. The patient has declined prostatectomy.   Today we reviewed the findings and workup thus far.  We discussed the natural history of prostate cancer.  We reviewed the the implications of T-stage, Gleason's Score, and  PSA on decision-making and outcomes in prostate cancer.  We discussed radiation treatment in the management of prostate cancer with regard to the logistics and delivery of external beam radiation treatment.  The patient would like to proceed with prostate IMRT.  I will share my findings with Dr. Karsten Ro and move forward with scheduling placement of three gold fiducial markers into the prostate to proceed with IMRT in the near future. The patient started Lupron a few days ago. We will have the patient return in 4-6 weeks for treatment planning. The patient works from 11PM -  7AM and therefore prefers  early morning appointments.  We enjoyed meeting with him today, and will look forward to participating in the care of this very nice gentleman.    [name], PA-C    Tyler Pita, MD  [facility] Direct Dial: [phone]  Fax: [phone] .com  Skype  LinkedIn  This document serves as a record of services personally performed by Freeman Caldron, PA-C and Tyler Pita, MD. It was created on their behalf by Darcus Austin, a trained medical scribe. The creation of this record is based on the scribe's personal observations and the providers' statements to them. This document has been checked and approved by the attending provider.

## 2016-08-31 ENCOUNTER — Telehealth: Payer: Self-pay | Admitting: *Deleted

## 2016-08-31 NOTE — Telephone Encounter (Signed)
CALLED PATIENT TO INFORM OF GOLD SEED PLACEMENT ON 09-18-16 - ARRIVAL TIME - 10:45 AM @ DR. OTTELIN'S OFFICE AND HIS SIM ON 10-19-16 @ 8 AM @ DR. MANNING'S OFFICE, SPOKE WITH PATIENT AND HE IS AWARE OF THESE APPTS.

## 2016-10-17 NOTE — Progress Notes (Signed)
  Radiation Oncology         [phone] ________________________________  Name: Dennis Huffman MRN: [id number]  Date: 10/19/2016  DOB: 09-05-1950  SIMULATION AND TREATMENT PLANNING NOTE    ICD-9-CM ICD-10-CM   1. Prostate cancer ([place]) 185 C61     DIAGNOSIS:  66 y.o. gentleman with Stage T1c adenocarcinoma of the prostate with Gleason Score of 4+4=8, and PSA of 7.14  NARRATIVE:  The patient was brought to the [facility].  Identity was confirmed.  All relevant records and images related to the planned course of therapy were reviewed.  The patient freely provided informed written consent to proceed with treatment after reviewing the details related to the planned course of therapy. The consent form was witnessed and verified by the simulation staff.  Then, the patient was set-up in a stable reproducible supine position for radiation therapy.  A vacuum lock pillow device was custom fabricated to position his legs in a reproducible immobilized position.  Then, I performed a urethrogram under sterile conditions to identify the prostatic apex.  CT images were obtained.  Surface markings were placed.  The CT images were loaded into the planning software.  Then the prostate target and avoidance structures including the rectum, bladder, bowel and hips were contoured.  Treatment planning then occurred.  The radiation prescription was entered and confirmed.  A total of 1 complex treatment devices were fabricated. I have requested : Intensity Modulated Radiotherapy (IMRT) is medically necessary for this case for the following reason:  Rectal sparing.[name]  PLAN:  The patient will receive 75 Gy in 40 fractions with 45 Gy to pelvis.  ________________________________  Sheral Apley. Tammi Klippel, M.D.

## 2016-10-19 ENCOUNTER — Encounter: Payer: Self-pay | Admitting: Medical Oncology

## 2016-10-19 ENCOUNTER — Ambulatory Visit
Admission: RE | Admit: 2016-10-19 | Discharge: 2016-10-19 | Disposition: A | Discharge status: Home / Self Care | Payer: BLUE CROSS/BLUE SHIELD | Source: Ambulatory Visit | Attending: Radiation Oncology | Admitting: Radiation Oncology

## 2016-10-19 DIAGNOSIS — Z510 Encounter for antineoplastic radiation therapy: Secondary | ICD-10-CM | POA: Diagnosis not present

## 2016-10-19 DIAGNOSIS — C61 Malignant neoplasm of prostate: Secondary | ICD-10-CM

## 2016-10-24 DIAGNOSIS — Z510 Encounter for antineoplastic radiation therapy: Secondary | ICD-10-CM | POA: Diagnosis not present

## 2016-10-30 ENCOUNTER — Ambulatory Visit
Admission: RE | Admit: 2016-10-30 | Discharge: 2016-10-30 | Disposition: A | Discharge status: Home / Self Care | Payer: BLUE CROSS/BLUE SHIELD | Source: Ambulatory Visit | Attending: Radiation Oncology | Admitting: Radiation Oncology

## 2016-10-30 DIAGNOSIS — Z510 Encounter for antineoplastic radiation therapy: Secondary | ICD-10-CM | POA: Diagnosis not present

## 2016-10-31 ENCOUNTER — Ambulatory Visit
Admission: RE | Admit: 2016-10-31 | Discharge: 2016-10-31 | Disposition: A | Discharge status: Home / Self Care | Payer: BLUE CROSS/BLUE SHIELD | Source: Ambulatory Visit | Attending: Radiation Oncology | Admitting: Radiation Oncology

## 2016-10-31 DIAGNOSIS — Z510 Encounter for antineoplastic radiation therapy: Secondary | ICD-10-CM | POA: Diagnosis not present

## 2016-11-01 ENCOUNTER — Ambulatory Visit
Admission: RE | Admit: 2016-11-01 | Discharge: 2016-11-01 | Disposition: A | Discharge status: Home / Self Care | Payer: BLUE CROSS/BLUE SHIELD | Source: Ambulatory Visit | Attending: Radiation Oncology | Admitting: Radiation Oncology

## 2016-11-01 DIAGNOSIS — Z510 Encounter for antineoplastic radiation therapy: Secondary | ICD-10-CM | POA: Diagnosis not present

## 2016-11-01 NOTE — Progress Notes (Signed)
Pt here for patient teaching.  Pt given Radiation and You booklet.  Reviewed areas of pertinence such as diarrhea, fatigue, hair loss, sexual and fertility changes, skin changes and urinary and bladder changes . Pt able to give teach back of to pat skin, use unscented/gentle soap, have Imodium on hand and drink plenty of water,avoid applying anything to skin within 4 hours of treatment and to use an electric razor if they must shave. Pt demonstrated understanding and verbalizes understanding of information given and will contact nursing with any questions or concerns.

## 2016-11-02 ENCOUNTER — Ambulatory Visit
Admission: RE | Admit: 2016-11-02 | Discharge: 2016-11-02 | Disposition: A | Discharge status: Home / Self Care | Payer: BLUE CROSS/BLUE SHIELD | Source: Ambulatory Visit | Attending: Radiation Oncology | Admitting: Radiation Oncology

## 2016-11-02 DIAGNOSIS — Z510 Encounter for antineoplastic radiation therapy: Secondary | ICD-10-CM | POA: Diagnosis not present

## 2016-11-05 ENCOUNTER — Encounter: Payer: Self-pay | Admitting: Radiation Oncology

## 2016-11-05 ENCOUNTER — Ambulatory Visit
Admission: RE | Admit: 2016-11-05 | Discharge: 2016-11-05 | Disposition: A | Discharge status: Home / Self Care | Payer: BLUE CROSS/BLUE SHIELD | Source: Ambulatory Visit | Attending: Radiation Oncology | Admitting: Radiation Oncology

## 2016-11-05 DIAGNOSIS — Z510 Encounter for antineoplastic radiation therapy: Secondary | ICD-10-CM | POA: Diagnosis not present

## 2016-11-05 NOTE — Progress Notes (Signed)
Paperwork Psychologist, counselling) received 11/05/2016, given to nurse 11/06/16

## 2016-11-06 ENCOUNTER — Telehealth: Payer: Self-pay | Admitting: Radiation Oncology

## 2016-11-06 ENCOUNTER — Ambulatory Visit
Admission: RE | Admit: 2016-11-06 | Discharge: 2016-11-06 | Disposition: A | Discharge status: Home / Self Care | Payer: BLUE CROSS/BLUE SHIELD | Source: Ambulatory Visit | Attending: Radiation Oncology | Admitting: Radiation Oncology

## 2016-11-06 DIAGNOSIS — Z510 Encounter for antineoplastic radiation therapy: Secondary | ICD-10-CM | POA: Diagnosis not present

## 2016-11-06 NOTE — Progress Notes (Signed)
Dennis Huffman, RT phoned to inquire for the patient if he can get a shingles shot while receiving radiation therapy. Asked Dennis Huffman, RT to tell the patient he should wait until after his radiation is complete.

## 2016-11-06 NOTE — Telephone Encounter (Signed)
Completed [employer] leave solutions paperwork. Placed in [place], PA_C in box to sign.

## 2016-11-07 ENCOUNTER — Ambulatory Visit
Admission: RE | Admit: 2016-11-07 | Discharge: 2016-11-07 | Disposition: A | Discharge status: Home / Self Care | Payer: BLUE CROSS/BLUE SHIELD | Source: Ambulatory Visit | Attending: Radiation Oncology | Admitting: Radiation Oncology

## 2016-11-07 DIAGNOSIS — Z510 Encounter for antineoplastic radiation therapy: Secondary | ICD-10-CM | POA: Diagnosis not present

## 2016-11-08 ENCOUNTER — Ambulatory Visit
Admission: RE | Admit: 2016-11-08 | Discharge: 2016-11-08 | Disposition: A | Discharge status: Home / Self Care | Payer: BLUE CROSS/BLUE SHIELD | Source: Ambulatory Visit | Attending: Radiation Oncology | Admitting: Radiation Oncology

## 2016-11-08 DIAGNOSIS — Z510 Encounter for antineoplastic radiation therapy: Secondary | ICD-10-CM | POA: Diagnosis not present

## 2016-11-09 ENCOUNTER — Ambulatory Visit
Admission: RE | Admit: 2016-11-09 | Discharge: 2016-11-09 | Disposition: A | Discharge status: Home / Self Care | Payer: BLUE CROSS/BLUE SHIELD | Source: Ambulatory Visit | Attending: Radiation Oncology | Admitting: Radiation Oncology

## 2016-11-09 ENCOUNTER — Encounter: Payer: Self-pay | Admitting: Radiation Oncology

## 2016-11-09 DIAGNOSIS — Z510 Encounter for antineoplastic radiation therapy: Secondary | ICD-10-CM | POA: Diagnosis not present

## 2016-11-09 NOTE — Progress Notes (Signed)
Paperwork Psychologist, counselling) received from doctor, faxed to [phone], confirmation received, spoke with patient that a copy would be left at treatment machine. 11/09/16

## 2016-11-12 ENCOUNTER — Ambulatory Visit
Admission: RE | Admit: 2016-11-12 | Discharge: 2016-11-12 | Disposition: A | Discharge status: Home / Self Care | Payer: BLUE CROSS/BLUE SHIELD | Source: Ambulatory Visit | Attending: Radiation Oncology | Admitting: Radiation Oncology

## 2016-11-12 DIAGNOSIS — Z510 Encounter for antineoplastic radiation therapy: Secondary | ICD-10-CM | POA: Diagnosis not present

## 2016-11-13 ENCOUNTER — Ambulatory Visit
Admission: RE | Admit: 2016-11-13 | Discharge: 2016-11-13 | Disposition: A | Discharge status: Home / Self Care | Payer: BLUE CROSS/BLUE SHIELD | Source: Ambulatory Visit | Attending: Radiation Oncology | Admitting: Radiation Oncology

## 2016-11-13 DIAGNOSIS — Z510 Encounter for antineoplastic radiation therapy: Secondary | ICD-10-CM | POA: Diagnosis not present

## 2016-11-14 ENCOUNTER — Ambulatory Visit
Admission: RE | Admit: 2016-11-14 | Discharge: 2016-11-14 | Disposition: A | Discharge status: Home / Self Care | Payer: BLUE CROSS/BLUE SHIELD | Source: Ambulatory Visit | Attending: Radiation Oncology | Admitting: Radiation Oncology

## 2016-11-14 DIAGNOSIS — Z510 Encounter for antineoplastic radiation therapy: Secondary | ICD-10-CM | POA: Diagnosis not present

## 2016-11-15 ENCOUNTER — Ambulatory Visit
Admission: RE | Admit: 2016-11-15 | Discharge: 2016-11-15 | Disposition: A | Discharge status: Home / Self Care | Payer: BLUE CROSS/BLUE SHIELD | Source: Ambulatory Visit | Attending: Radiation Oncology | Admitting: Radiation Oncology

## 2016-11-15 DIAGNOSIS — Z510 Encounter for antineoplastic radiation therapy: Secondary | ICD-10-CM | POA: Diagnosis not present

## 2016-11-16 ENCOUNTER — Ambulatory Visit
Admission: RE | Admit: 2016-11-16 | Discharge: 2016-11-16 | Disposition: A | Discharge status: Home / Self Care | Payer: BLUE CROSS/BLUE SHIELD | Source: Ambulatory Visit | Attending: Radiation Oncology | Admitting: Radiation Oncology

## 2016-11-16 DIAGNOSIS — Z510 Encounter for antineoplastic radiation therapy: Secondary | ICD-10-CM | POA: Diagnosis not present

## 2016-11-20 ENCOUNTER — Ambulatory Visit
Admission: RE | Admit: 2016-11-20 | Discharge: 2016-11-20 | Disposition: A | Discharge status: Home / Self Care | Payer: BLUE CROSS/BLUE SHIELD | Source: Ambulatory Visit | Attending: Radiation Oncology | Admitting: Radiation Oncology

## 2016-11-20 DIAGNOSIS — Z510 Encounter for antineoplastic radiation therapy: Secondary | ICD-10-CM | POA: Diagnosis not present

## 2016-11-21 ENCOUNTER — Ambulatory Visit
Admission: RE | Admit: 2016-11-21 | Discharge: 2016-11-21 | Disposition: A | Discharge status: Home / Self Care | Payer: BLUE CROSS/BLUE SHIELD | Source: Ambulatory Visit | Attending: Radiation Oncology | Admitting: Radiation Oncology

## 2016-11-21 DIAGNOSIS — Z510 Encounter for antineoplastic radiation therapy: Secondary | ICD-10-CM | POA: Diagnosis not present

## 2016-11-22 ENCOUNTER — Ambulatory Visit
Admission: RE | Admit: 2016-11-22 | Discharge: 2016-11-22 | Disposition: A | Discharge status: Home / Self Care | Payer: BLUE CROSS/BLUE SHIELD | Source: Ambulatory Visit | Attending: Radiation Oncology | Admitting: Radiation Oncology

## 2016-11-22 DIAGNOSIS — Z510 Encounter for antineoplastic radiation therapy: Secondary | ICD-10-CM | POA: Diagnosis not present

## 2016-11-23 ENCOUNTER — Ambulatory Visit
Admission: RE | Admit: 2016-11-23 | Discharge: 2016-11-23 | Disposition: A | Discharge status: Home / Self Care | Payer: BLUE CROSS/BLUE SHIELD | Source: Ambulatory Visit | Attending: Radiation Oncology | Admitting: Radiation Oncology

## 2016-11-23 ENCOUNTER — Encounter: Payer: Self-pay | Admitting: Medical Oncology

## 2016-11-23 DIAGNOSIS — Z510 Encounter for antineoplastic radiation therapy: Secondary | ICD-10-CM | POA: Diagnosis not present

## 2016-11-26 ENCOUNTER — Ambulatory Visit
Admission: RE | Admit: 2016-11-26 | Discharge: 2016-11-26 | Disposition: A | Discharge status: Home / Self Care | Payer: BLUE CROSS/BLUE SHIELD | Source: Ambulatory Visit | Attending: Radiation Oncology | Admitting: Radiation Oncology

## 2016-11-26 DIAGNOSIS — Z510 Encounter for antineoplastic radiation therapy: Secondary | ICD-10-CM | POA: Diagnosis not present

## 2016-11-27 ENCOUNTER — Ambulatory Visit
Admission: RE | Admit: 2016-11-27 | Discharge: 2016-11-27 | Disposition: A | Discharge status: Home / Self Care | Payer: BLUE CROSS/BLUE SHIELD | Source: Ambulatory Visit | Attending: Radiation Oncology | Admitting: Radiation Oncology

## 2016-11-27 DIAGNOSIS — Z510 Encounter for antineoplastic radiation therapy: Secondary | ICD-10-CM | POA: Diagnosis not present

## 2016-11-28 ENCOUNTER — Ambulatory Visit
Admission: RE | Admit: 2016-11-28 | Discharge: 2016-11-28 | Disposition: A | Discharge status: Home / Self Care | Payer: BLUE CROSS/BLUE SHIELD | Source: Ambulatory Visit | Attending: Radiation Oncology | Admitting: Radiation Oncology

## 2016-11-28 DIAGNOSIS — C61 Malignant neoplasm of prostate: Secondary | ICD-10-CM | POA: Diagnosis not present

## 2016-11-28 DIAGNOSIS — Z510 Encounter for antineoplastic radiation therapy: Secondary | ICD-10-CM | POA: Diagnosis not present

## 2016-11-29 ENCOUNTER — Ambulatory Visit
Admission: RE | Admit: 2016-11-29 | Discharge: 2016-11-29 | Disposition: A | Discharge status: Home / Self Care | Payer: BLUE CROSS/BLUE SHIELD | Source: Ambulatory Visit | Attending: Radiation Oncology | Admitting: Radiation Oncology

## 2016-11-29 DIAGNOSIS — Z510 Encounter for antineoplastic radiation therapy: Secondary | ICD-10-CM | POA: Diagnosis not present

## 2016-11-30 ENCOUNTER — Ambulatory Visit
Admission: RE | Admit: 2016-11-30 | Discharge: 2016-11-30 | Disposition: A | Discharge status: Home / Self Care | Payer: BLUE CROSS/BLUE SHIELD | Source: Ambulatory Visit | Attending: Radiation Oncology | Admitting: Radiation Oncology

## 2016-11-30 DIAGNOSIS — Z510 Encounter for antineoplastic radiation therapy: Secondary | ICD-10-CM | POA: Diagnosis not present

## 2016-12-03 ENCOUNTER — Ambulatory Visit
Admission: RE | Admit: 2016-12-03 | Discharge: 2016-12-03 | Disposition: A | Discharge status: Home / Self Care | Payer: BLUE CROSS/BLUE SHIELD | Source: Ambulatory Visit | Attending: Radiation Oncology | Admitting: Radiation Oncology

## 2016-12-03 DIAGNOSIS — Z510 Encounter for antineoplastic radiation therapy: Secondary | ICD-10-CM | POA: Diagnosis not present

## 2016-12-04 ENCOUNTER — Ambulatory Visit
Admission: RE | Admit: 2016-12-04 | Discharge: 2016-12-04 | Disposition: A | Discharge status: Home / Self Care | Payer: BLUE CROSS/BLUE SHIELD | Source: Ambulatory Visit | Attending: Radiation Oncology | Admitting: Radiation Oncology

## 2016-12-04 DIAGNOSIS — Z510 Encounter for antineoplastic radiation therapy: Secondary | ICD-10-CM | POA: Diagnosis not present

## 2016-12-05 ENCOUNTER — Ambulatory Visit
Admission: RE | Admit: 2016-12-05 | Discharge: 2016-12-05 | Disposition: A | Discharge status: Home / Self Care | Payer: BLUE CROSS/BLUE SHIELD | Source: Ambulatory Visit | Attending: Radiation Oncology | Admitting: Radiation Oncology

## 2016-12-05 DIAGNOSIS — Z510 Encounter for antineoplastic radiation therapy: Secondary | ICD-10-CM | POA: Diagnosis not present

## 2016-12-06 ENCOUNTER — Ambulatory Visit
Admission: RE | Admit: 2016-12-06 | Discharge: 2016-12-06 | Disposition: A | Discharge status: Home / Self Care | Payer: BLUE CROSS/BLUE SHIELD | Source: Ambulatory Visit | Attending: Radiation Oncology | Admitting: Radiation Oncology

## 2016-12-06 DIAGNOSIS — Z510 Encounter for antineoplastic radiation therapy: Secondary | ICD-10-CM | POA: Diagnosis not present

## 2016-12-07 ENCOUNTER — Ambulatory Visit
Admission: RE | Admit: 2016-12-07 | Discharge: 2016-12-07 | Disposition: A | Discharge status: Home / Self Care | Payer: BLUE CROSS/BLUE SHIELD | Source: Ambulatory Visit | Attending: Radiation Oncology | Admitting: Radiation Oncology

## 2016-12-07 ENCOUNTER — Encounter: Payer: Self-pay | Admitting: Medical Oncology

## 2016-12-07 DIAGNOSIS — Z510 Encounter for antineoplastic radiation therapy: Secondary | ICD-10-CM | POA: Diagnosis not present

## 2016-12-07 NOTE — Progress Notes (Signed)
Dennis Huffman states he is  tolerating radiation well. He will complete radiation 12/25/16.

## 2016-12-10 ENCOUNTER — Ambulatory Visit
Admission: RE | Admit: 2016-12-10 | Discharge: 2016-12-10 | Disposition: A | Discharge status: Home / Self Care | Payer: BLUE CROSS/BLUE SHIELD | Source: Ambulatory Visit | Attending: Radiation Oncology | Admitting: Radiation Oncology

## 2016-12-10 DIAGNOSIS — Z510 Encounter for antineoplastic radiation therapy: Secondary | ICD-10-CM | POA: Diagnosis not present

## 2016-12-11 ENCOUNTER — Ambulatory Visit
Admission: RE | Admit: 2016-12-11 | Discharge: 2016-12-11 | Disposition: A | Discharge status: Home / Self Care | Payer: BLUE CROSS/BLUE SHIELD | Source: Ambulatory Visit | Attending: Radiation Oncology | Admitting: Radiation Oncology

## 2016-12-11 DIAGNOSIS — Z510 Encounter for antineoplastic radiation therapy: Secondary | ICD-10-CM | POA: Diagnosis not present

## 2016-12-12 ENCOUNTER — Ambulatory Visit
Admission: RE | Admit: 2016-12-12 | Discharge: 2016-12-12 | Disposition: A | Discharge status: Home / Self Care | Payer: BLUE CROSS/BLUE SHIELD | Source: Ambulatory Visit | Attending: Radiation Oncology | Admitting: Radiation Oncology

## 2016-12-12 DIAGNOSIS — Z510 Encounter for antineoplastic radiation therapy: Secondary | ICD-10-CM | POA: Diagnosis not present

## 2016-12-13 ENCOUNTER — Ambulatory Visit
Admission: RE | Admit: 2016-12-13 | Discharge: 2016-12-13 | Disposition: A | Discharge status: Home / Self Care | Payer: BLUE CROSS/BLUE SHIELD | Source: Ambulatory Visit | Attending: Radiation Oncology | Admitting: Radiation Oncology

## 2016-12-13 DIAGNOSIS — Z510 Encounter for antineoplastic radiation therapy: Secondary | ICD-10-CM | POA: Diagnosis not present

## 2016-12-14 ENCOUNTER — Ambulatory Visit
Admission: RE | Admit: 2016-12-14 | Discharge: 2016-12-14 | Disposition: A | Discharge status: Home / Self Care | Payer: BLUE CROSS/BLUE SHIELD | Source: Ambulatory Visit | Attending: Radiation Oncology | Admitting: Radiation Oncology

## 2016-12-14 DIAGNOSIS — Z510 Encounter for antineoplastic radiation therapy: Secondary | ICD-10-CM | POA: Diagnosis not present

## 2016-12-17 ENCOUNTER — Ambulatory Visit
Admission: RE | Admit: 2016-12-17 | Discharge: 2016-12-17 | Disposition: A | Discharge status: Home / Self Care | Payer: BLUE CROSS/BLUE SHIELD | Source: Ambulatory Visit | Attending: Radiation Oncology | Admitting: Radiation Oncology

## 2016-12-17 DIAGNOSIS — Z510 Encounter for antineoplastic radiation therapy: Secondary | ICD-10-CM | POA: Diagnosis not present

## 2016-12-18 ENCOUNTER — Ambulatory Visit
Admission: RE | Admit: 2016-12-18 | Discharge: 2016-12-18 | Disposition: A | Discharge status: Home / Self Care | Payer: BLUE CROSS/BLUE SHIELD | Source: Ambulatory Visit | Attending: Radiation Oncology | Admitting: Radiation Oncology

## 2016-12-18 DIAGNOSIS — Z510 Encounter for antineoplastic radiation therapy: Secondary | ICD-10-CM | POA: Diagnosis not present

## 2016-12-19 ENCOUNTER — Ambulatory Visit
Admission: RE | Admit: 2016-12-19 | Discharge: 2016-12-19 | Disposition: A | Discharge status: Home / Self Care | Payer: BLUE CROSS/BLUE SHIELD | Source: Ambulatory Visit | Attending: Radiation Oncology | Admitting: Radiation Oncology

## 2016-12-19 DIAGNOSIS — Z510 Encounter for antineoplastic radiation therapy: Secondary | ICD-10-CM | POA: Diagnosis not present

## 2016-12-20 ENCOUNTER — Ambulatory Visit
Admission: RE | Admit: 2016-12-20 | Discharge: 2016-12-20 | Disposition: A | Discharge status: Home / Self Care | Payer: BLUE CROSS/BLUE SHIELD | Source: Ambulatory Visit | Attending: Radiation Oncology | Admitting: Radiation Oncology

## 2016-12-20 DIAGNOSIS — Z510 Encounter for antineoplastic radiation therapy: Secondary | ICD-10-CM | POA: Diagnosis not present

## 2016-12-21 ENCOUNTER — Encounter: Payer: Self-pay | Admitting: Radiation Oncology

## 2016-12-21 ENCOUNTER — Ambulatory Visit
Admission: RE | Admit: 2016-12-21 | Discharge: 2016-12-21 | Disposition: A | Discharge status: Home / Self Care | Payer: BLUE CROSS/BLUE SHIELD | Source: Ambulatory Visit | Attending: Radiation Oncology | Admitting: Radiation Oncology

## 2016-12-21 DIAGNOSIS — Z510 Encounter for antineoplastic radiation therapy: Secondary | ICD-10-CM | POA: Diagnosis not present

## 2016-12-21 NOTE — Progress Notes (Signed)
Per Mickel Baas at Dr. Simone Curia office the patient received Lupron 45 on 08/27/16 and is due again in September. Also, patient is scheduled to have his PSA drawn on 02/12/17 and a follow up with Dr. Karsten Ro on 02/19/17.

## 2016-12-24 ENCOUNTER — Ambulatory Visit
Admission: RE | Admit: 2016-12-24 | Discharge: 2016-12-24 | Disposition: A | Discharge status: Home / Self Care | Payer: BLUE CROSS/BLUE SHIELD | Source: Ambulatory Visit | Attending: Radiation Oncology | Admitting: Radiation Oncology

## 2016-12-24 DIAGNOSIS — Z510 Encounter for antineoplastic radiation therapy: Secondary | ICD-10-CM | POA: Diagnosis not present

## 2016-12-25 ENCOUNTER — Encounter: Payer: Self-pay | Admitting: Radiation Oncology

## 2016-12-25 ENCOUNTER — Ambulatory Visit
Admission: RE | Admit: 2016-12-25 | Discharge: 2016-12-25 | Disposition: A | Discharge status: Home / Self Care | Payer: BLUE CROSS/BLUE SHIELD | Source: Ambulatory Visit | Attending: Radiation Oncology | Admitting: Radiation Oncology

## 2016-12-25 DIAGNOSIS — Z510 Encounter for antineoplastic radiation therapy: Secondary | ICD-10-CM | POA: Diagnosis not present

## 2016-12-27 NOTE — Progress Notes (Signed)
  Radiation Oncology         [phone] ________________________________  Name: Dennis Huffman MRN: [id number]  Date: 12/25/2016  DOB: 05-Feb-1951  End of Treatment Note  Diagnosis:   66 y.o. gentleman with Stage T1c adenocarcinoma of the prostate with Gleason Score of 4+4=8, and PSA of 7.14     Indication for treatment:  Curative, Definitive Radiotherapy       Radiation treatment dates:  10/30/16 - 12/25/16  Site/dose:  1. The prostate, seminal vesicles, and pelvic lymph nodes were initially treated to 45 Gy in 25 fractions of 1.8 Gy  2. The prostate only was boosted to 75 Gy with 15 additional fractions of 2.0 Gy   Beams/energy:  1. The prostate, seminal vesicles, and pelvic lymph nodes were initially treated using VMAT intensity modulated radiotherapy delivering 6 megavolt photons. Image guidance was performed with CB-CT studies prior to each fraction. He was immobilized with a body fix lower extremity mold.  2. the prostate only was boosted using VMAT intensity modulated radiotherapy delivering 6 megavolt photons. Image guidance was performed with CB-CT studies prior to each fraction. He was immobilized with a body fix lower extremity mold.  Narrative: The patient tolerated radiation treatment relatively well.   The patient experienced some minor urinary irritation, frequency, urgency, and modest fatigue.  He denies pain, dysuria, hematuria or any bowel complaints.  Plan: The patient has completed radiation treatment. He will return to radiation oncology clinic for routine followup in one month. I advised him to call or return sooner if he has any questions or concerns related to his recovery or treatment. He recieved Lupron 45 on 08/27/16 and is due in September. Scheduled for PSA draw on 02/12/17 and followup with Dr. Karsten Ro on 02/19/17. ________________________________  Sheral Apley. Tammi Klippel, M.D.  This document serves as a record of services personally performed by Tyler Pita MD. It  was created on his behalf by Delton Coombes, a trained medical scribe. The creation of this record is based on the scribe's personal observations and the provider's statements to them. This document has been checked and approved by the attending provider.

## 2017-01-18 ENCOUNTER — Telehealth: Payer: Self-pay | Admitting: *Deleted

## 2017-01-18 NOTE — Telephone Encounter (Signed)
On 01-18-17 pt came by wanted medical records to file a claim with the [facility] , I check with sam , she said it was ok, it was consult note, end of tx note, sim and planning note

## 2017-02-06 NOTE — Progress Notes (Addendum)
Radiation Oncology         [phone] ________________________________  Name: Dennis Huffman MRN: [id number]  Date: 02/07/2017  DOB: December 18, 1950  Post Treatment Note  CC: [name], MD  [name], MD  Diagnosis:    66 y.o. gentleman with Stage T1c adenocarcinoma of the prostate with Gleason Score of 4+4=8, and PSA of 7.14.   Interval Since Last Radiation:  6 weeks  10/30/16 - 12/25/16: 1. The prostate, seminal vesicles, and pelvic lymph nodes were initially treated to 45 Gy in 25 fractions of 1.8 Gy  2. The prostate only was boosted to 75 Gy with 15 additional fractions of 2.0 Gy   Narrative:  The patient returns today for routine follow-up.  He tolerated radiation treatment relatively well with only minor urinary irritation in the form of increased frequency, urgency, and modest fatigue.  He denied pain, dysuria, hematuria or any bowel complaints.  He remained on Lupron throughout his course of treatment.                   On review of systems, the patient states that he is doing well overall. He continues with increased frequency and urgency as well as nocturia 2-3 times per night. His current IPSS score is 13 and pretreatment IPSS score was 5.  He he denies dysuria, gross hematuria, weak stream, intermittency or incomplete emptying. He reports a healthy appetite and is maintaining his weight. He is having normal daily bowel movements and denies abdominal pain, nausea, vomiting or diarrhea.  He remains on ADT which he tolerates well. He denies hot flashes or any significant decrease in his energy level.  ALLERGIES:  is allergic to hydrocodone.  Meds: Current Outpatient Prescriptions  Medication Sig Dispense Refill  . aspirin EC 81 MG tablet Take 81 mg by mouth daily.    [name] atorvastatin (LIPITOR) 10 MG tablet Take 10 mg by mouth daily.    . Glucosamine-Chondroitin-MSM [phone] MG TABS Take 3 tablets by mouth daily.    . valsartan (DIOVAN) 160 MG tablet Take 80 mg by mouth  daily. 1/2 tablet daily     No current facility-administered medications for this encounter.     Physical Findings:  height is 5\' 11"  (1.803 m) and weight is 252 lb (114.3 kg). His oral temperature is 98.1 F (36.7 C). His blood pressure is 164/95 (abnormal) and his pulse is 51 (abnormal). His respiration is 20 and oxygen saturation is 100%.  Pain Assessment Pain Score: 0-No pain/10 In general this is a well appearing African-American male in no acute distress. He's alert and oriented x4 and appropriate throughout the examination. Cardiopulmonary assessment is negative for acute distress and he exhibits normal effort.   Lab Findings: Lab Results  Component Value Date   WBC 5.8 05/09/2015   HGB 14.0 05/09/2015   HCT 39.5 05/09/2015   MCV 78.7 05/09/2015   PLT 172 05/09/2015     Radiographic Findings: No results found.  Impression/Plan: 1.  66 y.o. gentleman with Stage T1c adenocarcinoma of the prostate with Gleason Score of 4+4=8, and PSA of 7.14.   He will continue to follow up with urology for ongoing PSA determinations and has an appointment scheduled with Dr. Karsten Ro on 02/19/17 at 10:15am. He plans to complete a total of 2 years of ADT.  He understands what to expect with regards to PSA monitoring going forward. I will look forward to following his response to treatment via correspondence with urology, and would be happy to  continue to participate in his care if clinically indicated. I talked to the patient about what to expect in the future, including his risk for erectile dysfunction and rectal bleeding. I encouraged him to call or return to the office if he has any questions regarding his previous radiation or possible radiation side effects. He was comfortable with this plan and will follow up as needed.    [name], PA-C

## 2017-02-07 ENCOUNTER — Encounter: Payer: Self-pay | Admitting: Urology

## 2017-02-07 ENCOUNTER — Ambulatory Visit
Admission: RE | Admit: 2017-02-07 | Discharge: 2017-02-07 | Disposition: A | Discharge status: Home / Self Care | Payer: BLUE CROSS/BLUE SHIELD | Source: Ambulatory Visit | Attending: Urology | Admitting: Urology

## 2017-02-07 VITALS — BP 164/95 | HR 51 | Temp 98.1°F | Resp 20 | Ht 71.0 in | Wt 252.0 lb

## 2017-02-07 DIAGNOSIS — C61 Malignant neoplasm of prostate: Secondary | ICD-10-CM | POA: Diagnosis present

## 2017-02-07 DIAGNOSIS — Z923 Personal history of irradiation: Secondary | ICD-10-CM | POA: Insufficient documentation

## 2017-02-07 NOTE — Addendum Note (Signed)
Encounter addended by: Freeman Caldron, PA-C on: 02/07/2017 11:18 AM<BR>    Actions taken: Sign clinical note

## 2017-03-08 NOTE — Addendum Note (Signed)
Encounter addended by: Heywood Footman, RN on: 03/08/2017 12:08 PM<BR>    Actions taken: Visit Navigator Flowsheet section accepted

## 2017-04-12 ENCOUNTER — Ambulatory Visit
Admission: RE | Admit: 2017-04-12 | Discharge: 2017-04-12 | Disposition: A | Discharge status: Home / Self Care | Payer: BLUE CROSS/BLUE SHIELD | Source: Ambulatory Visit | Attending: Geriatric Medicine | Admitting: Geriatric Medicine

## 2017-04-12 ENCOUNTER — Other Ambulatory Visit: Payer: Self-pay | Admitting: Geriatric Medicine

## 2017-04-12 DIAGNOSIS — M25551 Pain in right hip: Secondary | ICD-10-CM

## 2017-04-26 ENCOUNTER — Ambulatory Visit (HOSPITAL_COMMUNITY)
Admission: EM | Admit: 2017-04-26 | Discharge: 2017-04-26 | Disposition: A | Discharge status: Home / Self Care | Payer: BLUE CROSS/BLUE SHIELD

## 2017-04-26 ENCOUNTER — Encounter (HOSPITAL_COMMUNITY): Payer: Self-pay | Admitting: Emergency Medicine

## 2017-04-26 ENCOUNTER — Ambulatory Visit: Payer: BLUE CROSS/BLUE SHIELD | Admitting: Radiation Oncology

## 2017-04-26 DIAGNOSIS — M25561 Pain in right knee: Secondary | ICD-10-CM

## 2017-04-26 DIAGNOSIS — I10 Essential (primary) hypertension: Secondary | ICD-10-CM | POA: Diagnosis not present

## 2017-04-26 DIAGNOSIS — M25551 Pain in right hip: Secondary | ICD-10-CM

## 2017-04-26 DIAGNOSIS — M159 Polyosteoarthritis, unspecified: Secondary | ICD-10-CM

## 2017-04-26 MED ORDER — METHYLPREDNISOLONE ACETATE 80 MG/ML IJ SUSP
80.0000 mg | Freq: Once | INTRAMUSCULAR | Status: AC
  Administered 2017-04-26: 80 mg via INTRAMUSCULAR

## 2017-04-26 MED ORDER — METHYLPREDNISOLONE SODIUM SUCC 125 MG IJ SOLR
INTRAMUSCULAR | Status: AC
  Filled 2017-04-26: qty 2

## 2017-04-26 MED ORDER — METHYLPREDNISOLONE ACETATE 80 MG/ML IJ SUSP
INTRAMUSCULAR | Status: AC
  Filled 2017-04-26: qty 1

## 2017-04-26 NOTE — Discharge Instructions (Signed)
Be sure to continue taking her blood pressure medicine, follow up with her primary care provider for blood pressure check. Follow-up with your orthopedist on Monday for your hip and knee pain.

## 2017-04-26 NOTE — ED Triage Notes (Signed)
Pt here for persistent right hip pain and right knee pain  Reports he is seeing his orthopedic provider and was given 800 mg of Ibup w/no relief.   Has been dx w/arthritis.   Denies recent inj/trauma  BP today is 179/113  Pt is A&O x4... NAD... Ambulatory

## 2017-04-26 NOTE — ED Provider Notes (Signed)
[place]    CSN: [id number] Arrival date & time: 04/26/17  1417     History   Chief Complaint Chief Complaint  Patient presents with  . Hip Pain  . Knee Pain    HPI Dennis Huffman is a 66 y.o. male.   66 year old male complaining of right hip and right knee pain for 2-3 weeks. He has seen his orthopedist for this and evaluated. He has had x-rays and bone scanning recently as a result of prostate cancer in the past. Pain is radiating from the right hip down the thigh. It is worse with certain movements such as getting into and out of a car. Not so much with ambulation. He was prescribed 800 mg of ibuprofen but states this is not working well. He was told to come back to the office to receive an injection if he is not getting any better, however he called the office today and none of the providers were in the office. He states that he believes they have planned on giving him some sort of cortisone injection. Review of the recent imaging reveals degenerative joint disease in hips, knees and shoulders. No recent injury.      Past Medical History:  Diagnosis Date  . Arthritis    cervical spondylotic, radiculopathy  . Chronic kidney disease, stage III (moderate) (HCC)   . Hypercholesteremia   . Hypertension   . Prostate cancer [facility])     Patient Active Problem List   Diagnosis Date Noted  . Prostate cancer ([name]) 08/29/2016  . Neck pain 05/12/2015  . Bradycardia 04/21/2014  . Hyperlipidemia 04/21/2014    Past Surgical History:  Procedure Laterality Date  . ANTERIOR CERVICAL DECOMP/DISCECTOMY FUSION N/A 05/12/2015   Procedure: ANTERIOR CERVICAL DECOMPRESSION/DISCECTOMY FUSION C3 - C6 3 LEVELS;  Surgeon: Melina Schools, MD;  Location: [place];  Service: Orthopedics;  Laterality: N/A;  . colonscopy    . PROSTATE BIOPSY         Home Medications    Prior to Admission medications   Medication Sig Start Date End Date Taking? Authorizing Provider  atorvastatin  (LIPITOR) 10 MG tablet Take 10 mg by mouth daily.   Yes [provider]  co-enzyme Q-10 30 MG capsule Take 30 mg by mouth 3 (three) times daily.   Yes [provider]  Glucosamine-Chondroitin-MSM [id number] MG TABS Take 3 tablets by mouth daily.   Yes [provider]  Ibuprofen-Famotidine (DUEXIS PO) Take by mouth.   Yes [provider]  losartan (COZAAR) 100 MG tablet Take 100 mg by mouth daily.   Yes [provider]  valsartan (DIOVAN) 160 MG tablet Take 80 mg by mouth daily. 1/2 tablet daily   Yes [provider]    Family History Family History  Problem Relation Age of Onset  . Stroke Sister   . Cancer Sister        breast    Social History Social History  Substance Use Topics  . Smoking status: Former Smoker    Quit date: 06/26/1971  . Smokeless tobacco: Never Used  . Alcohol use Yes     Comment: socially      Allergies   Hydrocodone   Review of Systems Review of Systems  Constitutional: Negative.   Respiratory: Negative.   Gastrointestinal: Negative.   Genitourinary: Negative.   Musculoskeletal:       As per HPI  Skin: Negative.   Neurological: Negative for dizziness, weakness, numbness and headaches.  All other systems  reviewed and are negative.    Physical Exam Triage Vital Signs ED Triage Vitals  Enc Vitals Group     BP 04/26/17 1443 (!) 179/113     Pulse Rate 04/26/17 1443 (!) 55     Resp 04/26/17 1443 18     Temp 04/26/17 1443 98.2 F (36.8 C)     Temp Source 04/26/17 1443 Oral     SpO2 04/26/17 1443 99 %     Weight --      Height --      Head Circumference --      Peak Flow --      Pain Score 04/26/17 1445 4     Pain Loc --      Pain Edu? --      Excl. in [place]? --    No data found.   Updated Vital Signs BP (!) 179/113 (BP Location: Left Arm)   Pulse (!) 55   Temp 98.2 F (36.8 C) (Oral)   Resp 18   SpO2 99%   Visual Acuity Right Eye Distance:   Left Eye Distance:   Bilateral  Distance:    Right Eye Near:   Left Eye Near:    Bilateral Near:     Physical Exam  Constitutional: He is oriented to person, place, and time. He appears well-developed and well-nourished. No distress.  Eyes: EOM are normal.  Neck: Normal range of motion. Neck supple.  Cardiovascular: Normal rate.   Pulmonary/Chest: Effort normal. No respiratory distress.  Musculoskeletal:  Some tenderness to the hip however pain is elicited primarily with certain movements. There is minor swelling in both lower extremities which is chronic. He had been advised to wear compression stockings but not wearing currently.  Neurological: He is alert and oriented to person, place, and time. He exhibits normal muscle tone.  Skin: Skin is warm and dry.  Psychiatric: He has a normal mood and affect.  Nursing note and vitals reviewed.    UC Treatments / Results  Labs (all labs ordered are listed, but only abnormal results are displayed) Labs Reviewed - No data to display  EKG  EKG Interpretation None       Radiology No results found.  Procedures Procedures (including critical care time)  Medications Ordered in UC Medications  methylPREDNISolone acetate (DEPO-MEDROL) injection 80 mg (not administered)     Initial Impression / Assessment and Plan / UC Course  I have reviewed the triage vital signs and the nursing notes.  Pertinent labs & imaging results that were available during my care of the patient were reviewed by me and considered in my medical decision making (see chart for details).    Be sure to continue taking her blood pressure medicine, follow up with her primary care provider for blood pressure check. Follow-up with your orthopedist on Monday for your hip and knee pain.     Final Clinical Impressions(s) / UC Diagnoses   Final diagnoses:  Osteoarthritis of multiple joints, unspecified osteoarthritis type  Essential hypertension    New Prescriptions Current Discharge  Medication List       Controlled Substance Prescriptions [place] Controlled Substance Registry consulted? Not Applicable   Janne Napoleon, NP 04/26/17 1531

## 2017-04-29 ENCOUNTER — Ambulatory Visit
Admission: RE | Admit: 2017-04-29 | Discharge: 2017-04-29 | Disposition: A | Discharge status: Home / Self Care | Payer: BLUE CROSS/BLUE SHIELD | Source: Ambulatory Visit | Attending: Internal Medicine | Admitting: Internal Medicine

## 2017-04-29 ENCOUNTER — Other Ambulatory Visit: Payer: Self-pay | Admitting: Internal Medicine

## 2017-04-29 DIAGNOSIS — R1031 Right lower quadrant pain: Secondary | ICD-10-CM

## 2017-06-14 ENCOUNTER — Ambulatory Visit (HOSPITAL_COMMUNITY)
Admission: EM | Admit: 2017-06-14 | Discharge: 2017-06-14 | Disposition: A | Discharge status: Home / Self Care | Payer: BLUE CROSS/BLUE SHIELD | Attending: Internal Medicine | Admitting: Internal Medicine

## 2017-06-14 ENCOUNTER — Encounter (HOSPITAL_COMMUNITY): Payer: Self-pay | Admitting: Emergency Medicine

## 2017-06-14 DIAGNOSIS — M549 Dorsalgia, unspecified: Secondary | ICD-10-CM | POA: Diagnosis not present

## 2017-06-14 MED ORDER — CYCLOBENZAPRINE HCL 5 MG PO TABS: 5.0000 mg | ORAL_TABLET | Freq: Two times a day (BID) | ORAL | 0 refills | Status: DC | PRN

## 2017-06-14 NOTE — Discharge Instructions (Signed)
May continue with tylenol and tramadol as needed for pain. Light and regular activity and stretching. May try muscle relaxer, do not take if driving or drinking alcohol as may cause drowsiness. If develop chest pain, shortness of breath, sweating, dizziness, nausea associated with this pain please go to er.  If symptoms worsen or do not improve in the next week to return to be seen or to follow up with PCP.

## 2017-06-14 NOTE — ED Provider Notes (Signed)
Dennis Huffman    CSN: [id number] Arrival date & time: 06/14/17  1224     History   Chief Complaint Chief Complaint  Patient presents with  . Back Pain    HPI Dennis Huffman is a 66 y.o. male.   Dennis Huffman presents with complaints of left upper back pain which started when he woke up yesterday morning. Pain right now feels improved from yesterday morning. Took tylenol arthritis as well as tramadol which seem to have helped. History of cervical spondylotic arthritis and radiculopathy. History of kidney disease, creatinine of 1/64 last. History of hypertension. Pain is worse with movement of left arm. Heat application helped pain. He is right handed. Denies any known injury or overuse of the upper back. Denies previous similar. Denies neck pain, chest pain, nausea, vomiting, diaphoresis. States he feels mild numbness to entire hand. History of cervical spinal fusion.    ROS per HPI.       Past Medical History:  Diagnosis Date  . Arthritis    cervical spondylotic, radiculopathy  . Chronic kidney disease, stage III (moderate) (HCC)   . Hypercholesteremia   . Hypertension   . Prostate cancer [facility])     Patient Active Problem List   Diagnosis Date Noted  . Prostate cancer ([place]) 08/29/2016  . Neck pain 05/12/2015  . Bradycardia 04/21/2014  . Hyperlipidemia 04/21/2014    Past Surgical History:  Procedure Laterality Date  . ANTERIOR CERVICAL DECOMP/DISCECTOMY FUSION N/A 05/12/2015   Procedure: ANTERIOR CERVICAL DECOMPRESSION/DISCECTOMY FUSION C3 - C6 3 LEVELS;  Surgeon: Melina Schools, MD;  Location: [place];  Service: Orthopedics;  Laterality: N/A;  . colonscopy    . PROSTATE BIOPSY         Home Medications    Prior to Admission medications   Medication Sig Start Date End Date Taking? Authorizing Provider  atorvastatin (LIPITOR) 10 MG tablet Take 10 mg by mouth daily.   Yes [provider]  co-enzyme Q-10 30 MG capsule Take 30 mg by mouth 3 (three)  times daily.   Yes [provider]  Glucosamine-Chondroitin-MSM [id number] MG TABS Take 3 tablets by mouth daily.   Yes [provider]  Ibuprofen-Famotidine (DUEXIS PO) Take by mouth.   Yes [provider]  losartan (COZAAR) 100 MG tablet Take 100 mg by mouth daily.   Yes [provider]  traMADol (ULTRAM) 50 MG tablet Take by mouth every 6 (six) hours as needed.   Yes [provider]  valsartan (DIOVAN) 160 MG tablet Take 80 mg by mouth daily. 1/2 tablet daily   Yes [provider]  cyclobenzaprine (FLEXERIL) 5 MG tablet Take 1 tablet (5 mg total) by mouth 2 (two) times daily as needed for muscle spasms. 06/14/17   Zigmund Gottron, NP    Family History Family History  Problem Relation Age of Onset  . Stroke Sister   . Cancer Sister        breast    Social History Social History   Tobacco Use  . Smoking status: Former Smoker    Last attempt to quit: 06/26/1971    Years since quitting: 46.0  . Smokeless tobacco: Never Used  Substance Use Topics  . Alcohol use: Yes    Comment: socially   . Drug use: No     Allergies   Hydrocodone   Review of Systems Review of Systems   Physical Exam Triage Vital Signs ED Triage Vitals  Enc Vitals Group     BP  06/14/17 1303 117/67     Pulse Rate 06/14/17 1303 (!) 57     Resp 06/14/17 1303 20     Temp 06/14/17 1303 98.2 F (36.8 C)     Temp Source 06/14/17 1303 Oral     SpO2 06/14/17 1303 99 %     Weight --      Height --      Head Circumference --      Peak Flow --      Pain Score 06/14/17 1301 2     Pain Loc --      Pain Edu? --      Excl. in [place]? --    No data found.  Updated Vital Signs BP 117/67 (BP Location: Left Arm)   Pulse (!) 57   Temp 98.2 F (36.8 C) (Oral)   Resp 20   SpO2 99%   Visual Acuity Right Eye Distance:   Left Eye Distance:   Bilateral Distance:    Right Eye Near:   Left Eye Near:    Bilateral Near:     Physical Exam  Constitutional:  He is oriented to person, place, and time. He appears well-developed and well-nourished.  Cardiovascular: Normal rate and regular rhythm.  Pulmonary/Chest: Effort normal and breath sounds normal.  Musculoskeletal:       Thoracic back: He exhibits pain and spasm. He exhibits no bony tenderness.       Back:  Pain to musculature medial to scapula- distal trapezius; without bony or spinous tenderness; minimal tenderness with palpation; pain reproduced with gross ROM to left shoulder, with crossing left arm across chest ; gross sensation intact and without decreased ROM; strong radial pulse  Neurological: He is alert and oriented to person, place, and time.  Skin: Skin is warm and dry.  Vitals reviewed.    UC Treatments / Results  Labs (all labs ordered are listed, but only abnormal results are displayed) Labs Reviewed - No data to display  EKG  EKG Interpretation None       Radiology No results found.  Procedures Procedures (including critical care time)  Medications Ordered in UC Medications - No data to display   Initial Impression / Assessment and Plan / UC Course  I have reviewed the triage vital signs and the nursing notes.  Pertinent labs & imaging results that were available during my care of the patient were reviewed by me and considered in my medical decision making (see chart for details).     Muscle strain vs radiculopathy. Continue with tylenol and tramadol as needed. Muscle relaxer as needed. Return precautions provided. If symptoms worsen or do not improve in the next week to return to be seen or to follow up with PCP.  Patient verbalized understanding and agreeable to plan.    Final Clinical Impressions(s) / UC Diagnoses   Final diagnoses:  Upper back pain on left side    ED Discharge Orders        Ordered    cyclobenzaprine (FLEXERIL) 5 MG tablet  2 times daily PRN     06/14/17 1357       Controlled Substance Prescriptions [place] Controlled  Substance Registry consulted? Not Applicable   Zigmund Gottron, NP 06/14/17 1404    Zigmund Gottron, NP 06/14/17 [phone]

## 2017-06-14 NOTE — ED Triage Notes (Signed)
PT C/O: back pain associated w/left shoulder pain.... Reports he woke up yest w/pain.... 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 Has can that he uses on right hand   ONSET: yest  DENIES: inj/trauma  TAKING MEDS: Acetaminophen and Tramadol  A&O x4... NAD... Ambulatory

## 2017-08-20 ENCOUNTER — Other Ambulatory Visit: Payer: Self-pay

## 2017-08-20 ENCOUNTER — Emergency Department (HOSPITAL_COMMUNITY)
Admission: EM | Admit: 2017-08-20 | Discharge: 2017-08-20 | Discharge status: Against Medical Advice | Payer: Self-pay | Attending: Emergency Medicine | Admitting: Emergency Medicine

## 2017-08-20 ENCOUNTER — Encounter (HOSPITAL_COMMUNITY): Payer: Self-pay | Admitting: Emergency Medicine

## 2017-08-20 DIAGNOSIS — Z5321 Procedure and treatment not carried out due to patient leaving prior to being seen by health care provider: Secondary | ICD-10-CM | POA: Insufficient documentation

## 2017-08-20 NOTE — ED Triage Notes (Addendum)
Pt reports having right arm cramping that started around 0230 this morning. Pt reports having prior rotator cuff sx on right arm. Pt currently denies any pain at this time.

## 2017-09-02 DIAGNOSIS — Z8546 Personal history of malignant neoplasm of prostate: Secondary | ICD-10-CM | POA: Diagnosis not present

## 2017-09-10 DIAGNOSIS — C61 Malignant neoplasm of prostate: Secondary | ICD-10-CM | POA: Diagnosis not present

## 2017-09-26 DIAGNOSIS — I10 Essential (primary) hypertension: Secondary | ICD-10-CM | POA: Diagnosis not present

## 2017-09-26 DIAGNOSIS — Z1211 Encounter for screening for malignant neoplasm of colon: Secondary | ICD-10-CM | POA: Diagnosis not present

## 2017-11-06 DIAGNOSIS — M169 Osteoarthritis of hip, unspecified: Secondary | ICD-10-CM | POA: Diagnosis not present

## 2017-11-06 DIAGNOSIS — I10 Essential (primary) hypertension: Secondary | ICD-10-CM | POA: Diagnosis not present

## 2017-11-06 DIAGNOSIS — R7309 Other abnormal glucose: Secondary | ICD-10-CM | POA: Diagnosis not present

## 2017-11-06 DIAGNOSIS — E7800 Pure hypercholesterolemia, unspecified: Secondary | ICD-10-CM | POA: Diagnosis not present

## 2017-11-06 DIAGNOSIS — C61 Malignant neoplasm of prostate: Secondary | ICD-10-CM | POA: Diagnosis not present

## 2017-11-06 DIAGNOSIS — E669 Obesity, unspecified: Secondary | ICD-10-CM | POA: Diagnosis not present

## 2017-11-06 DIAGNOSIS — Z0000 Encounter for general adult medical examination without abnormal findings: Secondary | ICD-10-CM | POA: Diagnosis not present

## 2017-11-06 DIAGNOSIS — N183 Chronic kidney disease, stage 3 (moderate): Secondary | ICD-10-CM | POA: Diagnosis not present

## 2017-11-06 DIAGNOSIS — Z1389 Encounter for screening for other disorder: Secondary | ICD-10-CM | POA: Diagnosis not present

## 2017-11-21 DIAGNOSIS — K5730 Diverticulosis of large intestine without perforation or abscess without bleeding: Secondary | ICD-10-CM | POA: Diagnosis not present

## 2017-11-21 DIAGNOSIS — Z1211 Encounter for screening for malignant neoplasm of colon: Secondary | ICD-10-CM | POA: Diagnosis not present

## 2017-11-21 DIAGNOSIS — D126 Benign neoplasm of colon, unspecified: Secondary | ICD-10-CM | POA: Diagnosis not present

## 2017-11-25 DIAGNOSIS — M25551 Pain in right hip: Secondary | ICD-10-CM | POA: Diagnosis not present

## 2017-11-26 DIAGNOSIS — Z1211 Encounter for screening for malignant neoplasm of colon: Secondary | ICD-10-CM | POA: Diagnosis not present

## 2017-11-26 DIAGNOSIS — D126 Benign neoplasm of colon, unspecified: Secondary | ICD-10-CM | POA: Diagnosis not present

## 2017-12-16 ENCOUNTER — Encounter (HOSPITAL_COMMUNITY): Payer: Self-pay

## 2017-12-16 ENCOUNTER — Ambulatory Visit (HOSPITAL_COMMUNITY)
Admission: EM | Admit: 2017-12-16 | Discharge: 2017-12-16 | Disposition: A | Discharge status: Home / Self Care | Payer: Medicare HMO | Attending: Family Medicine | Admitting: Family Medicine

## 2017-12-16 ENCOUNTER — Telehealth (HOSPITAL_COMMUNITY): Payer: Self-pay | Admitting: Emergency Medicine

## 2017-12-16 DIAGNOSIS — T7840XA Allergy, unspecified, initial encounter: Secondary | ICD-10-CM | POA: Diagnosis not present

## 2017-12-16 MED ORDER — NEOMYCIN-POLYMYXIN-DEXAMETH 3.5-10000-0.1 OP SUSP: 1.0000 [drp] | Freq: Four times a day (QID) | OPHTHALMIC | 0 refills | Status: DC

## 2017-12-16 MED ORDER — GENTAMICIN-PREDNISOLONE ACET 0.3-0.6 % OP OINT: 1.0000 "application " | TOPICAL_OINTMENT | Freq: Two times a day (BID) | OPHTHALMIC | 0 refills | Status: DC

## 2017-12-16 NOTE — ED Provider Notes (Signed)
Dennis Huffman    CSN: [id number] Arrival date & time: 12/16/17  9628     History   Chief Complaint Chief Complaint  Patient presents with  . Rash    HPI Dennis Huffman is a 67 y.o. male.   HPI  Patient states he has had some stinging around his right eye for the last 2 or 3 days.  The eyelid looks swollen to him.  He has no known allergies.  He thinks he broke his eye with something on his fingers.  No foreign body sensation.  No trouble with vision.  No discharge.  No tearing.  No photophobia.  Is only in the right eye.  Left eye is normal. He has hypertension hyperlipidemia.  Well controlled.  Compliant with medications. He is recently completed treatment for prostate cancer and is doing well. He has multiple orthopedic problems which, also, doing well. No other complaints  Past Medical History:  Diagnosis Date  . Arthritis    cervical spondylotic, radiculopathy  . Chronic kidney disease, stage III (moderate) (HCC)   . Hypercholesteremia   . Hypertension   . Prostate cancer [facility])     Patient Active Problem List   Diagnosis Date Noted  . Prostate cancer ([place]) 08/29/2016  . Neck pain 05/12/2015  . Bradycardia 04/21/2014  . Hyperlipidemia 04/21/2014    Past Surgical History:  Procedure Laterality Date  . ANTERIOR CERVICAL DECOMP/DISCECTOMY FUSION N/A 05/12/2015   Procedure: ANTERIOR CERVICAL DECOMPRESSION/DISCECTOMY FUSION C3 - C6 3 LEVELS;  Surgeon: Melina Schools, MD;  Location: [place];  Service: Orthopedics;  Laterality: N/A;  . colonscopy    . PROSTATE BIOPSY         Home Medications    Prior to Admission medications   Medication Sig Start Date End Date Taking? Authorizing Provider  atorvastatin (LIPITOR) 10 MG tablet Take 10 mg by mouth daily.   Yes [provider]  co-enzyme Q-10 30 MG capsule Take 30 mg by mouth 3 (three) times daily.   Yes [provider]  losartan (COZAAR) 100 MG tablet Take 100 mg by mouth daily.   Yes  [provider]  traMADol (ULTRAM) 50 MG tablet Take by mouth every 6 (six) hours as needed.   Yes [provider]  valsartan (DIOVAN) 160 MG tablet Take 80 mg by mouth daily. 1/2 tablet daily   Yes [provider]  gentamicin-prednisolone (PRED-G) 0.3-0.6 % OINT Place 1 application into the right eye 2 (two) times daily. Rub into skin around eye 12/16/17   Raylene Everts, MD  Glucosamine-Chondroitin-MSM [phone] MG TABS Take 3 tablets by mouth daily.    [provider]    Family History Family History  Problem Relation Age of Onset  . Stroke Sister   . Cancer Sister        breast    Social History Social History   Tobacco Use  . Smoking status: Former Smoker    Last attempt to quit: 06/26/1971    Years since quitting: 46.5  . Smokeless tobacco: Never Used  Substance Use Topics  . Alcohol use: Yes    Comment: socially   . Drug use: No     Allergies   Hydrocodone   Review of Systems Review of Systems  Constitutional: Negative for chills and fever.  HENT: Negative for ear pain and sore throat.   Eyes: Positive for redness. Negative for photophobia, pain, discharge, itching and visual disturbance.       Lower eyelid  only, no problem with globe or vision  Respiratory: Negative for cough and shortness of breath.   Cardiovascular: Negative for chest pain and palpitations.  Gastrointestinal: Negative for abdominal pain and vomiting.  Genitourinary: Negative for dysuria and hematuria.  Musculoskeletal: Positive for neck pain and neck stiffness. Negative for arthralgias and back pain.  Skin: Negative for color change and rash.  Neurological: Negative for seizures and syncope.  All other systems reviewed and are negative.    Physical Exam Triage Vital Signs ED Triage Vitals [12/16/17 1020]  Enc Vitals Group     BP (!) 149/69     Pulse Rate (!) 54     Resp 20     Temp 98 F (36.7 C)     Temp src      SpO2 100 %     Weight       Height      Head Circumference      Peak Flow      Pain Score 0     Pain Loc      Pain Edu?      Excl. in [place]?    No data found.  Updated Vital Signs BP (!) 149/69   Pulse (!) 54   Temp 98 F (36.7 C)   Resp 20   SpO2 100%   Visual Acuity Right Eye Distance:   Left Eye Distance:   Bilateral Distance:    Right Eye Near:   Left Eye Near:    Bilateral Near:     Physical Exam  Constitutional: He appears well-developed and well-nourished. No distress.  HENT:  Head: Normocephalic and atraumatic.  Mouth/Throat: Oropharynx is clear and moist.  Eyes: Pupils are equal, round, and reactive to light. Conjunctivae are normal.    Neck: Normal range of motion.  Cardiovascular: Normal rate.  Pulmonary/Chest: Effort normal. No respiratory distress.  Abdominal: Soft. He exhibits no distension.  Musculoskeletal: Normal range of motion. He exhibits no edema.  Neurological: He is alert.  Skin: Skin is warm and dry.     UC Treatments / Results  Labs (all labs ordered are listed, but only abnormal results are displayed) Labs Reviewed - No data to display  EKG None  Radiology No results found.  Procedures Procedures (including critical care time)  Medications Ordered in UC Medications - No data to display  Initial Impression / Assessment and Plan / UC Course  I have reviewed the triage vital signs and the nursing notes.  Pertinent labs & imaging results that were available during my care of the patient were reviewed by me and considered in my medical decision making (see chart for details).      Final Clinical Impressions(s) / UC Diagnoses   Final diagnoses:  Allergic reaction, initial encounter     Discharge Instructions     Use the eye ointment 2-3 times a day until the rash goes away Rub into the skin surrounding the eye Wipe off excess with warm washcloth Return if this fails to resolve   ED Prescriptions    Medication Sig Dispense Auth. Provider    gentamicin-prednisolone (PRED-G) 0.3-0.6 % OINT Place 1 application into the right eye 2 (two) times daily. Rub into skin around eye 3.5 mL Raylene Everts, MD     Controlled Substance Prescriptions [place] Controlled Substance Registry consulted? Not Applicable   Raylene Everts, MD 12/16/17 1039

## 2017-12-16 NOTE — ED Triage Notes (Signed)
Pt presents with complaints of burning and redness under both eyes, right worse than the left. Denies any trouble with eyes or vision changes.

## 2017-12-16 NOTE — Discharge Instructions (Signed)
Use the eye ointment 2-3 times a day until the rash goes away Rub into the skin surrounding the eye Wipe off excess with warm washcloth Return if this fails to resolve

## 2017-12-31 DIAGNOSIS — H524 Presbyopia: Secondary | ICD-10-CM | POA: Diagnosis not present

## 2017-12-31 DIAGNOSIS — H52223 Regular astigmatism, bilateral: Secondary | ICD-10-CM | POA: Diagnosis not present

## 2017-12-31 DIAGNOSIS — H11153 Pinguecula, bilateral: Secondary | ICD-10-CM | POA: Diagnosis not present

## 2017-12-31 DIAGNOSIS — H25813 Combined forms of age-related cataract, bilateral: Secondary | ICD-10-CM | POA: Diagnosis not present

## 2017-12-31 DIAGNOSIS — H35033 Hypertensive retinopathy, bilateral: Secondary | ICD-10-CM | POA: Diagnosis not present

## 2018-01-21 DIAGNOSIS — M25551 Pain in right hip: Secondary | ICD-10-CM | POA: Diagnosis not present

## 2018-01-27 ENCOUNTER — Ambulatory Visit (HOSPITAL_COMMUNITY)
Admission: EM | Admit: 2018-01-27 | Discharge: 2018-01-27 | Disposition: A | Discharge status: Home / Self Care | Payer: Medicare HMO | Attending: Family Medicine | Admitting: Family Medicine

## 2018-01-27 ENCOUNTER — Encounter (HOSPITAL_COMMUNITY): Payer: Self-pay

## 2018-01-27 DIAGNOSIS — H9193 Unspecified hearing loss, bilateral: Secondary | ICD-10-CM | POA: Diagnosis not present

## 2018-01-27 DIAGNOSIS — H6122 Impacted cerumen, left ear: Secondary | ICD-10-CM

## 2018-01-27 NOTE — ED Provider Notes (Signed)
[place]    CSN: [id number] Arrival date & time: 01/27/18  1458     History   Chief Complaint Chief Complaint  Patient presents with  . Cerumen Impaction    HPI Dennis Huffman is a 67 y.o. male.   Patient is a 67 year old male that presents with cerumen impaction, fullness in ears, trouble hearing x1 week.  This is been worse in the left ear.  He bought a kit to use at home but was scared to use it so he came here for earwax removal.  Denies any pain in the ears, fever, chills, body aches or any other concerning symptoms.  ROS per HPI      Past Medical History:  Diagnosis Date  . Arthritis    cervical spondylotic, radiculopathy  . Chronic kidney disease, stage III (moderate) (HCC)   . Hypercholesteremia   . Hypertension   . Prostate cancer [facility])     Patient Active Problem List   Diagnosis Date Noted  . Prostate cancer ([place]) 08/29/2016  . Neck pain 05/12/2015  . Bradycardia 04/21/2014  . Hyperlipidemia 04/21/2014    Past Surgical History:  Procedure Laterality Date  . ANTERIOR CERVICAL DECOMP/DISCECTOMY FUSION N/A 05/12/2015   Procedure: ANTERIOR CERVICAL DECOMPRESSION/DISCECTOMY FUSION C3 - C6 3 LEVELS;  Surgeon: Melina Schools, MD;  Location: [place];  Service: Orthopedics;  Laterality: N/A;  . colonscopy    . PROSTATE BIOPSY         Home Medications    Prior to Admission medications   Medication Sig Start Date End Date Taking? Authorizing Provider  atorvastatin (LIPITOR) 10 MG tablet Take 10 mg by mouth daily.    [provider]  co-enzyme Q-10 30 MG capsule Take 30 mg by mouth 3 (three) times daily.    [provider]  gentamicin-prednisolone (PRED-G) 0.3-0.6 % OINT Place 1 application into the right eye 2 (two) times daily. Rub into skin around eye 12/16/17   Raylene Everts, MD  Glucosamine-Chondroitin-MSM [phone] MG TABS Take 3 tablets by mouth daily.    [provider]  losartan (COZAAR) 100 MG tablet  Take 100 mg by mouth daily.    [provider]  neomycin-polymyxin b-dexamethasone (MAXITROL) 3.5-10000-0.1 SUSP Place 1 drop into both eyes every 6 (six) hours. 12/16/17   Raylene Everts, MD  traMADol Veatrice Bourbon) 50 MG tablet Take by mouth every 6 (six) hours as needed.    [provider]  valsartan (DIOVAN) 160 MG tablet Take 80 mg by mouth daily. 1/2 tablet daily    [provider]    Family History Family History  Problem Relation Age of Onset  . Stroke Sister   . Cancer Sister        breast    Social History Social History   Tobacco Use  . Smoking status: Former Smoker    Last attempt to quit: 06/26/1971    Years since quitting: 46.6  . Smokeless tobacco: Never Used  Substance Use Topics  . Alcohol use: Yes    Comment: socially   . Drug use: No     Allergies   Hydrocodone   Review of Systems Review of Systems   Physical Exam Triage Vital Signs ED Triage Vitals [01/27/18 1517]  Enc Vitals Group     BP 138/77     Pulse Rate 60     Resp 20     Temp 98.8 F (37.1 C)     Temp Source Temporal  SpO2 100 %     Weight      Height      Head Circumference      Peak Flow      Pain Score 0     Pain Loc      Pain Edu?      Excl. in [place]?    No data found.  Updated Vital Signs BP 138/77 (BP Location: Left Arm)   Pulse 60   Temp 98.8 F (37.1 C) (Temporal)   Resp 20   SpO2 100%   Visual Acuity Right Eye Distance:   Left Eye Distance:   Bilateral Distance:    Right Eye Near:   Left Eye Near:    Bilateral Near:     Physical Exam  Constitutional: He is oriented to person, place, and time. He appears well-developed and well-nourished.  HENT:  Head: Normocephalic and atraumatic.  Right Ear: External ear normal.  Left Ear: External ear normal.  Nose: Nose normal.  Cerumen impaction in left ear.  Slight impaction in right ear but able to visualize TM.  Neck: Normal range of motion.  Pulmonary/Chest: Effort normal.    Neurological: He is alert and oriented to person, place, and time.  Skin: Skin is dry. Capillary refill takes less than 2 seconds.  Psychiatric: He has a normal mood and affect.  Nursing note and vitals reviewed.    UC Treatments / Results  Labs (all labs ordered are listed, but only abnormal results are displayed) Labs Reviewed - No data to display  EKG None  Radiology No results found.  Procedures Procedures (including critical care time)  Medications Ordered in UC Medications - No data to display  Initial Impression / Assessment and Plan / UC Course  I have reviewed the triage vital signs and the nursing notes.  Pertinent labs & imaging results that were available during my care of the patient were reviewed by me and considered in my medical decision making (see chart for details).     Cerumen removed from left ear.  TM visualized and normal.  Right TM normal also.  No need for any other treatment today.  Return as needed Final Clinical Impressions(s) / UC Diagnoses   Final diagnoses:  Impacted cerumen of left ear     Discharge Instructions     It was nice meeting you!!  We irrigated your ears. No signs of infection.  Follow up as needed.    ED Prescriptions    None     Controlled Substance Prescriptions [place] Controlled Substance Registry consulted? Not Applicable   Orvan July, NP 01/27/18 [phone]

## 2018-01-27 NOTE — Discharge Instructions (Addendum)
It was nice meeting you!!  We irrigated your ears. No signs of infection.  Follow up as needed.

## 2018-01-27 NOTE — ED Triage Notes (Signed)
Pt presents with clogged ears

## 2018-01-29 DIAGNOSIS — M25552 Pain in left hip: Secondary | ICD-10-CM | POA: Diagnosis not present

## 2018-02-03 DIAGNOSIS — H2513 Age-related nuclear cataract, bilateral: Secondary | ICD-10-CM | POA: Diagnosis not present

## 2018-02-03 DIAGNOSIS — H40033 Anatomical narrow angle, bilateral: Secondary | ICD-10-CM | POA: Diagnosis not present

## 2018-02-04 DIAGNOSIS — H43393 Other vitreous opacities, bilateral: Secondary | ICD-10-CM | POA: Diagnosis not present

## 2018-02-12 IMAGING — CT CT PELVIS W/O CM
3 series · 12 of 36 positions shown, 18 images · non-contrast
Comparison: 08/07/2016 bone scan and CT pelvis.

CLINICAL DATA: 66-year-old male with prostate cancer treated with
radiation seeds, completed February 2017. Right groin pain for
couple weeks extending to knee. Initial encounter.

EXAM:
CT PELVIS WITHOUT CONTRAST
TECHNIQUE: Multidetector CT imaging of the pelvis was performed following the
standard protocol without intravenous contrast.

[Series 3: routine pelvis · axial · 0.82mm/px · z∈[-161,-36]mm · 4 of 43 slices shown, 9 images]
[im 9/43  soft-tissue]
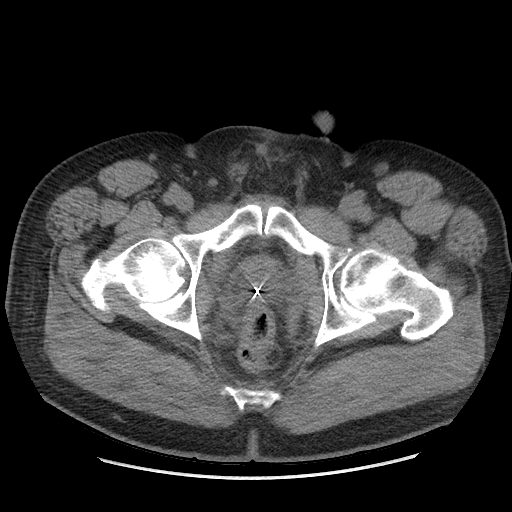
[im 9/43  lung]
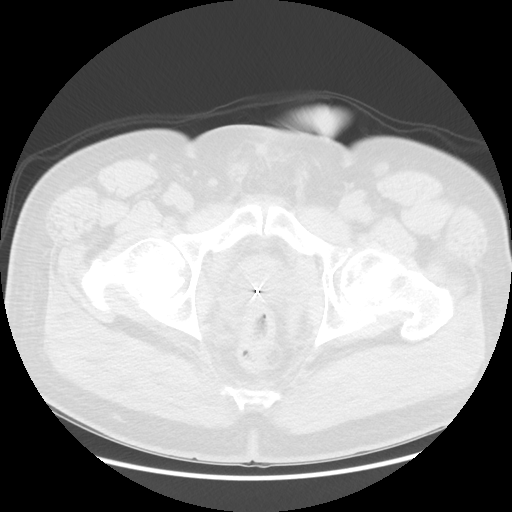
[im 9/43  bone]
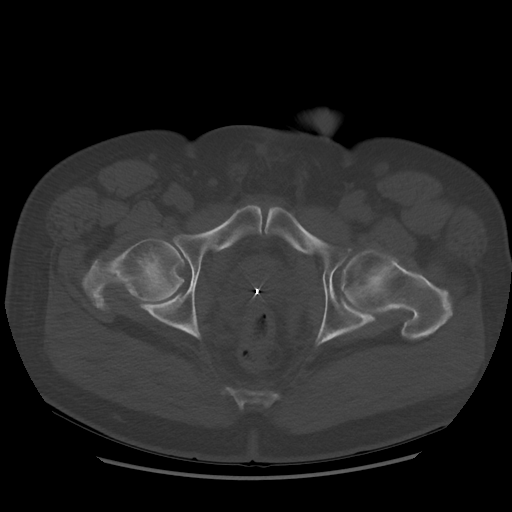
[im 17/43  soft-tissue]
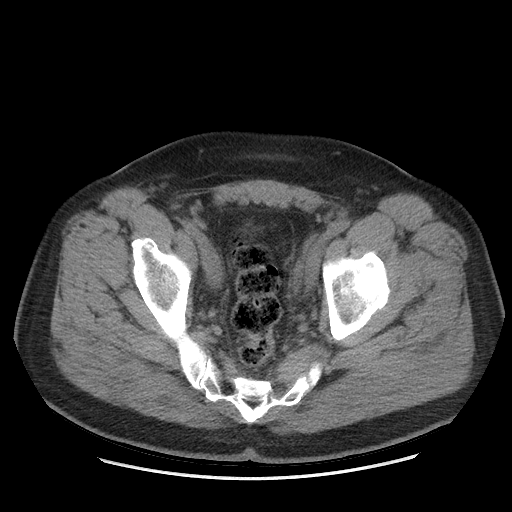
[im 17/43  lung]
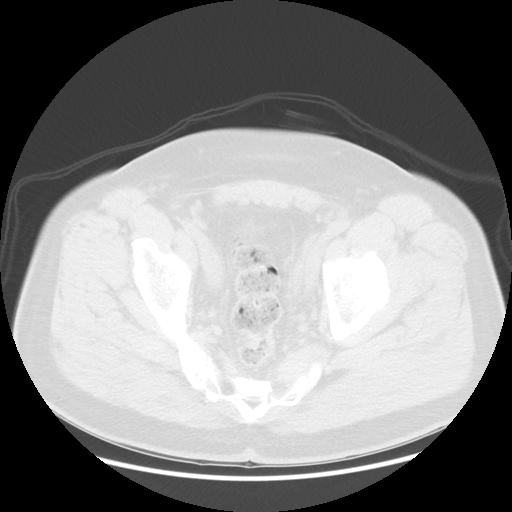
[im 26/43  soft-tissue]
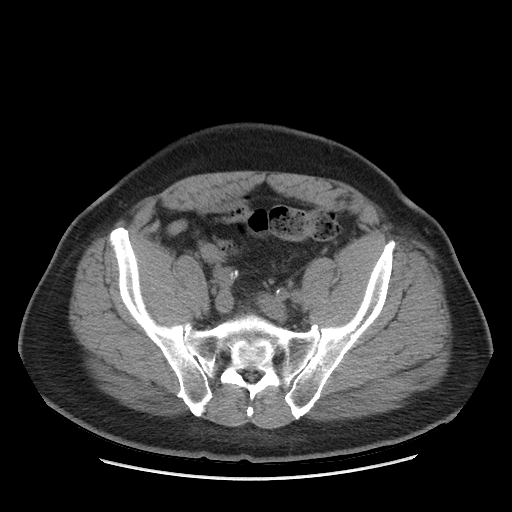
[im 26/43  lung]
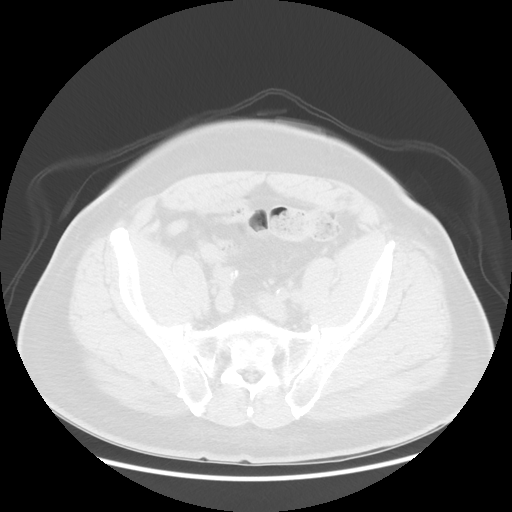
[im 34/43  soft-tissue]
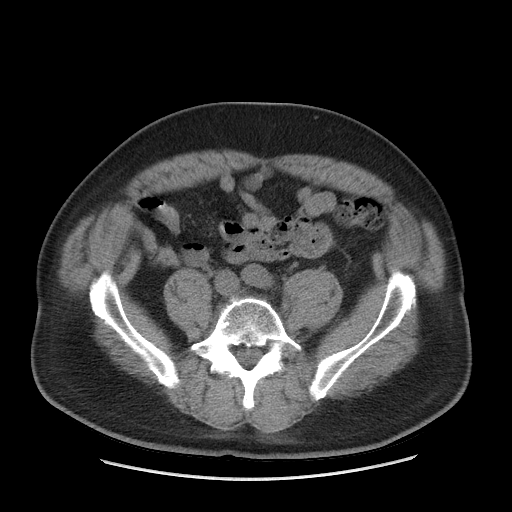
[im 34/43  lung]
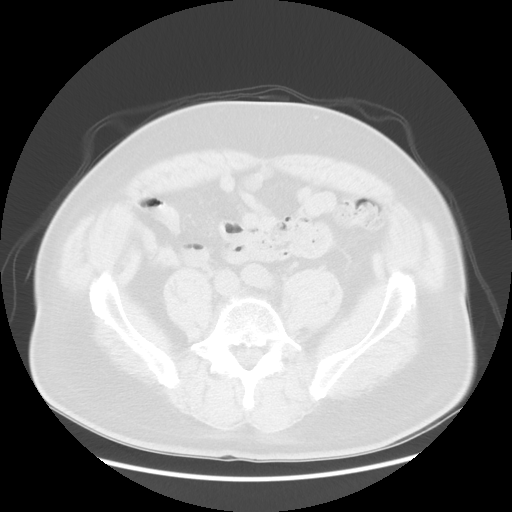

[Series 601: coronal body · coronal · 0.82mm/px · 1 of 134 slices shown, 2 images]
[im 45/134  soft-tissue]
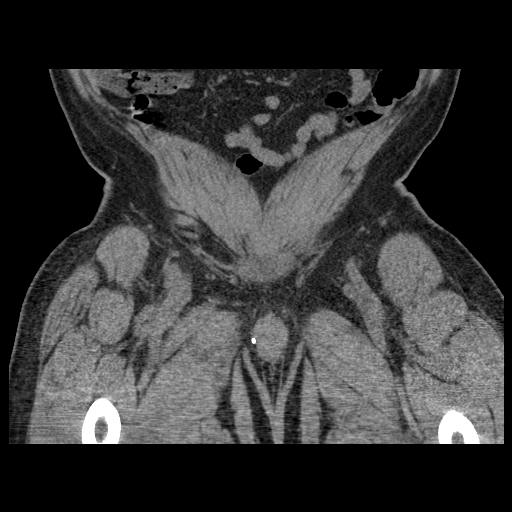
[im 45/134  bone]
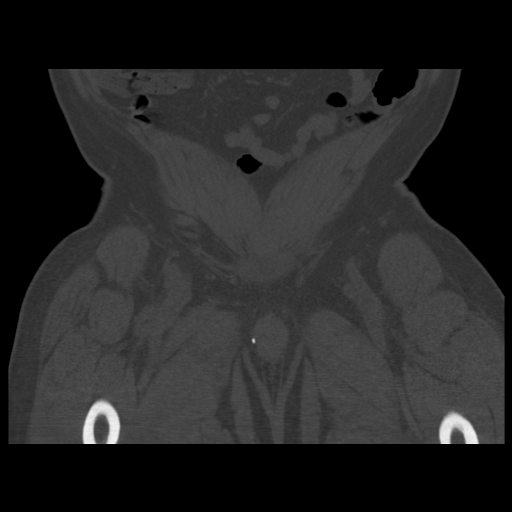

[Series 602: sagittal body · sagittal · 0.82mm/px · 7 of 169 slices shown]
[im 14/169  soft-tissue]
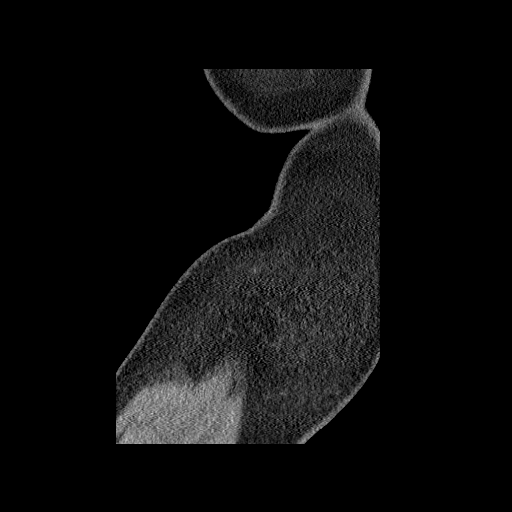
[im 34/169  soft-tissue]
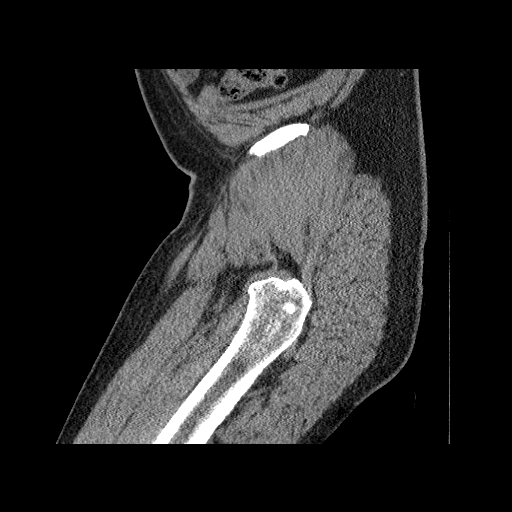
[im 54/169  soft-tissue]
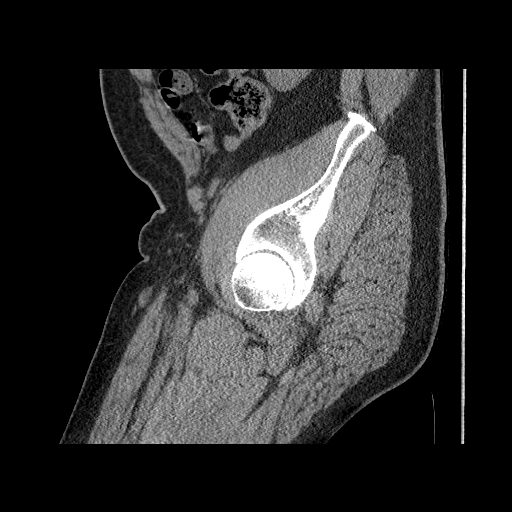
[im 74/169  soft-tissue]
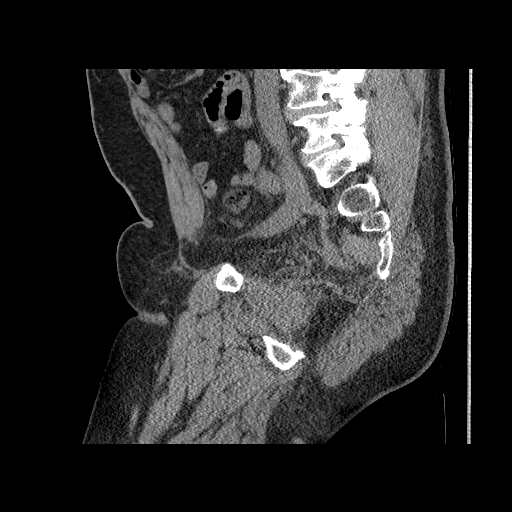
[im 95/169  soft-tissue]
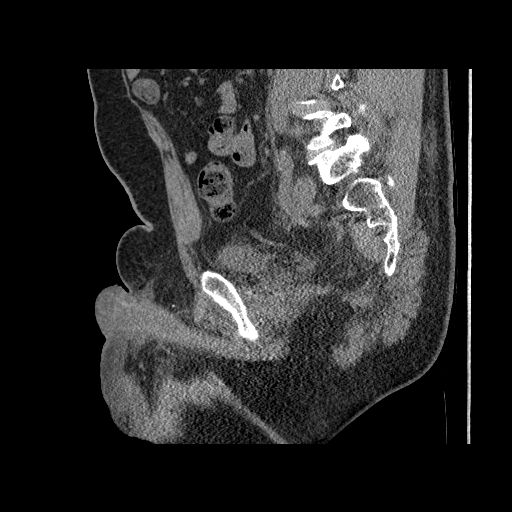
[im 115/169  soft-tissue]
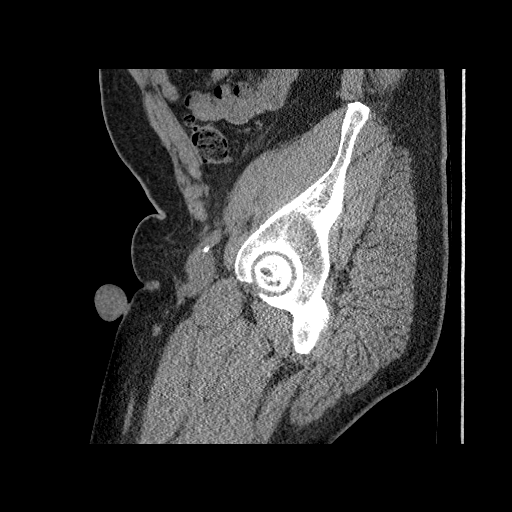
[im 135/169  soft-tissue]
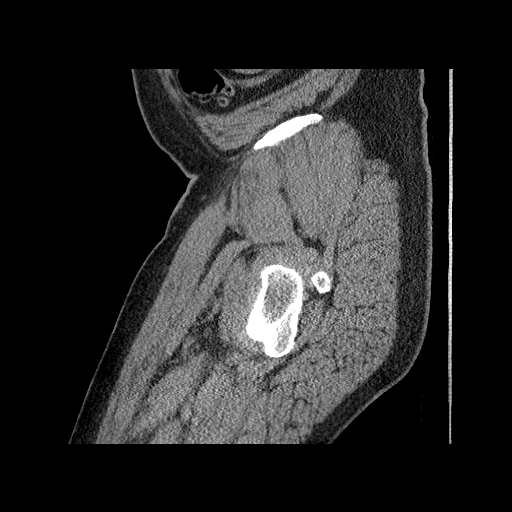

[12 of 36 positions shown; findings below may reference images not displayed]

FINDINGS: Urinary Tract: Decompressed urinary bladder with circumferential
wall thickening.

Bowel: Periumbilical hernia contains portion of small bowel loop but
does not appear to be causing obstruction.

Vascular/Lymphatic: External iliac lymph nodes slightly smaller than
on prior examination now with short axis dimension of 8.2 mm on the
right and 5.9 mm on the left versus prior 10 mm and 7 mm
respectively.

Top-normal size inguinal lymph nodes which short axis dimension of
10 mm unchanged from prior examination with appearance of fatty
center suggesting benign lymph nodes.

Small lymph nodes within the deep pelvis without adenopathy
identified.

Vascular calcifications lower abdominal aorta and iliac/ femoral
arteries. Ectasia without aneurysm.

Reproductive: Radiation seed implants within the prostate gland.
Hazy infiltration of surrounding fat planes suggestive of post
therapy changes. No obvious invasion of the rectum or urinary
bladder.

Other:  No bowel containing inguinal hernia.

Musculoskeletal: Tiny sclerotic focus right aspect L5 vertebral body
unchanged.

Degenerative changes L4-5 and L5-S1 with facet degenerative changes
and 3 mm anterior slip L4 and 2 mm retrolisthesis L5. Bulge.
Bilateral L4-5 and L5-S1 foraminal narrowing. Spinal stenosis and
lateral recess stenosis greater at the L4-5 level.

Mild bilateral hip joint degenerative changes.
IMPRESSION: Radiation seed implants within the prostate gland. Hazy infiltration
of surrounding fat planes suggestive of post therapy changes. No
obvious invasion of the rectum or urinary bladder.

Decrease in size of external iliac lymph nodes.

Tiny sclerotic focus right aspect L5 vertebral body unchanged. It is
possible this is a bone island although small stable metastatic
focus not entirely excluded given patient's history.

Degenerative changes L4-5 and L5-S1 with bilateral foraminal
narrowing and spinal stenosis/ lateral recess stenosis (greater at
the L4-5 level).

Mild bilateral hip joint degenerative changes.

Aortic Atherosclerosis ([id number]).

Small periumbilical hernia which contains portion of small bowel but
does not appear to be causing obstruction.

Decompressed urinary bladder with circumferential wall thickening.

## 2018-02-14 DIAGNOSIS — I10 Essential (primary) hypertension: Secondary | ICD-10-CM | POA: Diagnosis not present

## 2018-02-27 DIAGNOSIS — M25552 Pain in left hip: Secondary | ICD-10-CM | POA: Diagnosis not present

## 2018-03-03 DIAGNOSIS — M25551 Pain in right hip: Secondary | ICD-10-CM | POA: Diagnosis not present

## 2018-03-03 DIAGNOSIS — M25552 Pain in left hip: Secondary | ICD-10-CM | POA: Diagnosis not present

## 2018-03-04 DIAGNOSIS — C61 Malignant neoplasm of prostate: Secondary | ICD-10-CM | POA: Diagnosis not present

## 2018-03-06 DIAGNOSIS — M25551 Pain in right hip: Secondary | ICD-10-CM | POA: Diagnosis not present

## 2018-03-11 DIAGNOSIS — C61 Malignant neoplasm of prostate: Secondary | ICD-10-CM | POA: Diagnosis not present

## 2018-03-24 DIAGNOSIS — M25552 Pain in left hip: Secondary | ICD-10-CM | POA: Diagnosis not present

## 2018-04-10 ENCOUNTER — Telehealth: Payer: Self-pay | Admitting: Physician Assistant

## 2018-04-10 DIAGNOSIS — M25552 Pain in left hip: Secondary | ICD-10-CM | POA: Diagnosis not present

## 2018-04-10 NOTE — Telephone Encounter (Signed)
° °  ° °  [facility] Pre-operative Risk Assessment    Request for surgical clearance:  1. What type of surgery is being performed? Total Hip  2. When is this surgery scheduled? TBD  3. What type of clearance is required (medical clearance vs. Pharmacy clearance to hold med vs. Both)? Both  4. Are there any medications that need to be held prior to surgery and how long?  5. Practice name and name of physician performing surgery? [facility]  6. What is your office phone number [phone]   7.   What is your office fax number [phone]  8.   Anesthesia type (None, local, MAC, general) ? general   Laurier Nancy 04/10/2018, 10:51 AM  _________________________________________________________________   (provider comments below)

## 2018-04-13 ENCOUNTER — Encounter: Payer: Self-pay | Admitting: Physician Assistant

## 2018-04-13 NOTE — Progress Notes (Signed)
Cardiology Office Note    Date:  04/14/2018  ID:  Dennis Huffman, DOB 1951-05-19, MRN [id number] PCP:  Dennis Low, MD  Cardiologist:  Dennis Grooms, MD   Chief Complaint: pre-op hip surgery  History of Present Illness:  Dennis Huffman is a 67 y.o. male with history of palpitations, bradycardia,  HTN, HLD, CKD stage III who presents for pre-op clearance. Records reviewed from Dennis Huffman note which states "had arm pain and was given hydrocodone in 2014.  At that time, he he noticed some palpitations so he stopped the medicine. The palpitations then resolved.  ECG was Huffman with Dennis Huffman and his HR was slow, per his report 40 bpm. Due to his age, he was referred here to be evaluated for bradycardia and was seen in 2015. He had a negative w/u at that time." Holter 12/2015 showed NSR with rare PACs/PVCs, no pathologic arrhythmias. Bradycardia is somewhat confusing on report as it indicates was 29bpm at 11:06 pm but later in report states minimum HR was 45bpm at 4:55am. There are no strips to corroborate where the 29 came from. Nevertheless Dennis Huffman reviewed report and considered it benign; average HR was 68. Last Epic labs 2016 CBC wnl, K 3.9, Cr 1.64, but more recent labs by [facility] 10/2017 showed Tchol 165, LDL 81, HDL 59, A1C 5.7, Cr 1.080.  He returns for pre-op hip surgery clearance. Date is TBD. In general he feels like he is doing fine from a cardiac standpoint. No angina or palpitations. He gets mild DOE with higher level of exertion but states this is not out of the ordinary of what he would expect, citing "I'm 67 years old! This feels normal for me!." He is able to complete >4 METS without limitation from cardiac standpoint with stairs and housework. He does not exercise strenuously anymore out of fear of injuring his hip arthritis further. He does get mild occasional dizziness upon standing which is fleeting. Resolves with pausing and bending for a moment. No syncope.   Past  Medical History:  Diagnosis Date  . Arthritis    cervical spondylotic, radiculopathy  . Bradycardia    a. HR previously reported as 40 at PCP's office; event monitor 2017 showed nocturnal bradycardia but no daytime findings.  . Chronic kidney disease, stage III (moderate) ([place])   . Hypercholesteremia   . Hypertension   . Premature atrial contractions   . Prostate cancer ([place])   . PVC's (premature ventricular contractions)     Past Surgical History:  Procedure Laterality Date  . ANTERIOR CERVICAL DECOMP/DISCECTOMY FUSION N/A 05/12/2015   Procedure: ANTERIOR CERVICAL DECOMPRESSION/DISCECTOMY FUSION C3 - C6 3 LEVELS;  Surgeon: Dennis Schools, MD;  Location: [place];  Service: Orthopedics;  Laterality: N/A;  . colonscopy    . PROSTATE BIOPSY      Current Medications: Current Meds  Medication Sig  . atorvastatin (LIPITOR) 10 MG tablet Take 10 mg by mouth daily.  [name] doxycycline (PERIOSTAT) 20 MG tablet Take 20 mg by mouth 2 (two) times daily.  . Glucosamine-Chondroitin-MSM [phone] MG TABS Take 3 tablets by mouth daily.  [name] losartan (COZAAR) 100 MG tablet Take 100 mg by mouth daily.  . Omega-3 Fatty Acids (OMEGA 3 PO) Take 1 tablet by mouth daily.  [name] PUMPKIN SEED PO Take 2 tablets by mouth daily.  . vitamin B-12 (CYANOCOBALAMIN) 1000 MCG tablet Take 1,000 mcg by mouth daily.  . [DISCONTINUED] co-enzyme Q-10 30 MG capsule Take 30 mg by mouth 3 (three)  times daily.  . [DISCONTINUED] gentamicin-prednisolone (PRED-G) 0.3-0.6 % OINT Place 1 application into the right eye 2 (two) times daily. Rub into skin around eye  . [DISCONTINUED] neomycin-polymyxin b-dexamethasone (MAXITROL) 3.5-10000-0.1 SUSP Place 1 drop into both eyes every 6 (six) hours.  . [DISCONTINUED] valsartan (DIOVAN) 160 MG tablet Take 80 mg by mouth daily. 1/2 tablet daily  . [DISCONTINUED] valsartan (DIOVAN) 160 MG tablet valsartan 160 mg tablet  TAKE 1 TABLET EVERY DAY      Allergies:   Hydrocodone   Social History    Socioeconomic History  . Marital status: Single    Spouse name: Not on file  . Number of children: Not on file  . Years of education: Not on file  . Highest education level: Not on file  Occupational History  . Not on file  Social Needs  . Financial resource strain: Not on file  . Food insecurity:    Worry: Not on file    Inability: Not on file  . Transportation needs:    Medical: Not on file    Non-medical: Not on file  Tobacco Use  . Smoking status: Former Smoker    Last attempt to quit: 06/26/1971    Years since quitting: 46.8  . Smokeless tobacco: Never Used  Substance and Sexual Activity  . Alcohol use: Yes    Comment: socially   . Drug use: No  . Sexual activity: Yes  Lifestyle  . Physical activity:    Days per week: Not on file    Minutes per session: Not on file  . Stress: Not on file  Relationships  . Social connections:    Talks on phone: Not on file    Gets together: Not on file    Attends religious service: Not on file    Active member of club or organization: Not on file    Attends meetings of clubs or organizations: Not on file    Relationship status: Not on file  Other Topics Concern  . Not on file  Social History Narrative  . Not on file     Family History:  The patient's family history includes Cancer in his sister; Stroke in his sister.  ROS:   Please see the history of present illness. He also has some left leg pain he's been told may be due to nerve issue - description does not sound like claudication (occurs at rest, not with exertion), no non-healing wounds, good pulses. All other systems are reviewed and otherwise negative.    PHYSICAL EXAM:   VS:  BP 132/60   Pulse 82   Ht 5\' 11"  (1.803 m)   Wt 240 lb 12.8 oz (109.2 kg)   SpO2 97%   BMI 33.58 kg/m   BMI: Body mass index is 33.58 kg/m. GEN: Well nourished, well developed AAM, in no acute distress HEENT: normocephalic, atraumatic Neck: no JVD, carotid bruits, or masses Cardiac:  RRR; no murmurs, rubs, or gallops, no edema, 2+ pedal pulses bilaterally Respiratory:  clear to auscultation bilaterally, normal work of breathing GI: soft, nontender, nondistended, + BS MS: no deformity or atrophy Skin: warm and dry, no rash. R nail has midline strip of darkened pigmentation Neuro:  Alert and Oriented x 3, Strength and sensation are intact, follows commands Psych: euthymic mood, full affect  Wt Readings from Last 3 Encounters:  04/14/18 240 lb 12.8 oz (109.2 kg)  02/07/17 252 lb (114.3 kg)  08/30/16 246 lb (111.6 kg)      Studies/Labs  Reviewed:   EKG:  EKG was ordered today and personally reviewed by me and demonstrates NSR 79bpm nonspecific STT changes, no significant change from prior.  Recent Labs: No results found for requested labs within last 8760 hours.   Lipid Panel No results found for: CHOL, TRIG, HDL, CHOLHDL, VLDL, LDLCALC, LDLDIRECT  Additional studies/ records that were reviewed today include: Summarized above    ASSESSMENT & PLAN:   1. Pre-op cardiovascular clearance - revised cardiac risk index is 0.4% indicating Huffman risk of cardiac complications. He is able to complete over 4 METS without any anginal symptoms. Therefore, based on ACC/AHA guidelines, Dennis Huffman would be at acceptable risk for the planned procedure without further cardiovascular testing. I will route this recommendation to the requesting party via Epic fax function and remove from pre-op pool. I also signed his form for him to take back to surgeon. He also plans to see his PCP for general medical clearance as well. 2. PVCs/PACs - these have not been symptomatic recently. Continue to monitor. 3. Bradycardia - asymptomatic. 4. HTN - relatively controlled. He does describe some mild sx of orthostasis but does not require adjustment of regimen at present time.  5. Abnormality of nail - middle finger on R hand has midline stripe of discoloration. Have advised he discuss with PCP  as this sometimes can represent a variant of skin cancer/melanoma.  Disposition: F/u with Dennis Huffman in 1 year.   Medication Adjustments/Labs and Tests Ordered: Current medicines are reviewed at length with the patient today.  Concerns regarding medicines are outlined above. Medication changes, Labs and Tests ordered today are summarized above and listed in the Patient Instructions accessible in Encounters.   Signed, Charlie Pitter, PA-C  04/14/2018 9:09 AM    [address], [place], [place]  [address] Phone: [phone]; Fax: [phone]

## 2018-04-14 ENCOUNTER — Ambulatory Visit: Payer: Medicare HMO | Admitting: Physician Assistant

## 2018-04-14 ENCOUNTER — Encounter: Payer: Self-pay | Admitting: Physician Assistant

## 2018-04-14 VITALS — BP 132/60 | HR 82 | Ht 71.0 in | Wt 240.8 lb

## 2018-04-14 DIAGNOSIS — I10 Essential (primary) hypertension: Secondary | ICD-10-CM | POA: Diagnosis not present

## 2018-04-14 DIAGNOSIS — L609 Nail disorder, unspecified: Secondary | ICD-10-CM | POA: Diagnosis not present

## 2018-04-14 DIAGNOSIS — Z01810 Encounter for preprocedural cardiovascular examination: Secondary | ICD-10-CM

## 2018-04-14 DIAGNOSIS — I491 Atrial premature depolarization: Secondary | ICD-10-CM

## 2018-04-14 DIAGNOSIS — Z8679 Personal history of other diseases of the circulatory system: Secondary | ICD-10-CM | POA: Diagnosis not present

## 2018-04-14 DIAGNOSIS — Z87898 Personal history of other specified conditions: Secondary | ICD-10-CM

## 2018-04-14 DIAGNOSIS — I493 Ventricular premature depolarization: Secondary | ICD-10-CM

## 2018-04-14 NOTE — Patient Instructions (Signed)
Medication Instructions:  Your physician recommends that you continue on your current medications as directed. Please refer to the Current Medication list given to you today.  If you need a refill on your cardiac medications before your next appointment, please call your pharmacy.   Lab work: None ordered  If you have labs (blood work) drawn today and your tests are completely normal, you will receive your results only by: [name] MyChart Message (if you have MyChart) OR . A paper copy in the mail If you have any lab test that is abnormal or we need to change your treatment, we will call you to review the results.  Testing/Procedures: None ordered  Follow-Up: At [facility], you and your health needs are our priority.  As part of our continuing mission to provide you with exceptional heart care, we have created designated Provider Care Teams.  These Care Teams include your primary Cardiologist (physician) and Advanced Practice Providers (APPs -  Physician Assistants and Nurse Practitioners) who all work together to provide you with the care you need, when you need it. You will need a follow up appointment in 1 years.  Please call our office 2 months in advance to schedule this appointment.  You may see Larae Grooms, MD or one of the following Advanced Practice Providers on your designated Care Team:   [place], PA-C Melina Copa, PA-C . Ermalinda Barrios, PA-C  Any Other Special Instructions Will Be Listed Below (If Applicable).  -- Please let your primary care dr know about the nailbed discoloration on your middle finger of the right hand

## 2018-04-15 NOTE — Telephone Encounter (Signed)
   Primary Cardiologist: Larae Grooms, MD  Chart reviewed as part of pre-operative protocol coverage.  He was seen by Melina Copa, PAC on 04/14/2018 and is at acceptable risk for the planned procedure without further cardiovascular testing.   I will route this recommendation to the requesting party via Epic fax function and remove from pre-op pool.  He is not on any anticoagulation or antiplatelet agents.   Please call with questions.  Rosaria Ferries, PA-C 04/15/2018, 3:58 PM

## 2018-04-25 DIAGNOSIS — Z23 Encounter for immunization: Secondary | ICD-10-CM | POA: Diagnosis not present

## 2018-04-25 DIAGNOSIS — Z01818 Encounter for other preprocedural examination: Secondary | ICD-10-CM | POA: Diagnosis not present

## 2018-04-25 DIAGNOSIS — C61 Malignant neoplasm of prostate: Secondary | ICD-10-CM | POA: Diagnosis not present

## 2018-04-25 DIAGNOSIS — I10 Essential (primary) hypertension: Secondary | ICD-10-CM | POA: Diagnosis not present

## 2018-04-25 DIAGNOSIS — M169 Osteoarthritis of hip, unspecified: Secondary | ICD-10-CM | POA: Diagnosis not present

## 2018-04-25 DIAGNOSIS — E7800 Pure hypercholesterolemia, unspecified: Secondary | ICD-10-CM | POA: Diagnosis not present

## 2018-04-25 DIAGNOSIS — N183 Chronic kidney disease, stage 3 (moderate): Secondary | ICD-10-CM | POA: Diagnosis not present

## 2018-05-20 DIAGNOSIS — L821 Other seborrheic keratosis: Secondary | ICD-10-CM | POA: Diagnosis not present

## 2018-05-20 DIAGNOSIS — L819 Disorder of pigmentation, unspecified: Secondary | ICD-10-CM | POA: Diagnosis not present

## 2018-05-20 DIAGNOSIS — D225 Melanocytic nevi of trunk: Secondary | ICD-10-CM | POA: Diagnosis not present

## 2018-06-09 DIAGNOSIS — M25552 Pain in left hip: Secondary | ICD-10-CM | POA: Diagnosis not present

## 2018-06-19 DIAGNOSIS — M25552 Pain in left hip: Secondary | ICD-10-CM | POA: Diagnosis not present

## 2018-06-24 DIAGNOSIS — E7800 Pure hypercholesterolemia, unspecified: Secondary | ICD-10-CM | POA: Diagnosis not present

## 2018-06-24 DIAGNOSIS — R110 Nausea: Secondary | ICD-10-CM | POA: Diagnosis not present

## 2018-08-25 DIAGNOSIS — M25562 Pain in left knee: Secondary | ICD-10-CM | POA: Diagnosis not present

## 2018-08-28 DIAGNOSIS — C61 Malignant neoplasm of prostate: Secondary | ICD-10-CM | POA: Diagnosis not present

## 2018-09-01 DIAGNOSIS — M25562 Pain in left knee: Secondary | ICD-10-CM | POA: Diagnosis not present

## 2018-09-03 DIAGNOSIS — M25552 Pain in left hip: Secondary | ICD-10-CM | POA: Diagnosis not present

## 2018-09-04 DIAGNOSIS — Z8546 Personal history of malignant neoplasm of prostate: Secondary | ICD-10-CM | POA: Diagnosis not present

## 2018-09-04 DIAGNOSIS — N400 Enlarged prostate without lower urinary tract symptoms: Secondary | ICD-10-CM | POA: Diagnosis not present

## 2018-09-08 DIAGNOSIS — M169 Osteoarthritis of hip, unspecified: Secondary | ICD-10-CM | POA: Diagnosis not present

## 2018-09-08 DIAGNOSIS — R252 Cramp and spasm: Secondary | ICD-10-CM | POA: Diagnosis not present

## 2018-09-25 DIAGNOSIS — M169 Osteoarthritis of hip, unspecified: Secondary | ICD-10-CM | POA: Diagnosis not present

## 2018-09-29 ENCOUNTER — Other Ambulatory Visit (HOSPITAL_COMMUNITY): Payer: Medicare HMO

## 2018-10-10 ENCOUNTER — Ambulatory Visit: Admit: 2018-10-10 | Payer: Medicare HMO | Admitting: Orthopedic Surgery

## 2018-10-10 SURGERY — ARTHROPLASTY, HIP, TOTAL,POSTERIOR APPROACH
Anesthesia: Choice | Laterality: Left

## 2018-10-16 DIAGNOSIS — M25552 Pain in left hip: Secondary | ICD-10-CM | POA: Diagnosis not present

## 2018-11-10 DIAGNOSIS — M25572 Pain in left ankle and joints of left foot: Secondary | ICD-10-CM | POA: Diagnosis not present

## 2018-11-13 DIAGNOSIS — M25552 Pain in left hip: Secondary | ICD-10-CM | POA: Diagnosis not present

## 2018-11-13 NOTE — Progress Notes (Signed)
SPOKE W/  _ patient   Denies the following below     SCREENING SYMPTOMS OF COVID 19:   COUGH--  RUNNY NOSE---   SORE THROAT---  NASAL CONGESTION----  SNEEZING----  SHORTNESS OF BREATH---  DIFFICULTY BREATHING---  TEMP >100.0 -----  UNEXPLAINED BODY ACHES------  CHILLS --------   HEADACHES ---------  LOSS OF SMELL/ TASTE --------    HAVE YOU OR ANY FAMILY MEMBER TRAVELLED PAST 14 DAYS OUT OF THE   COUNTY--- STATE---- COUNTRY----  HAVE YOU OR ANY FAMILY MEMBER BEEN EXPOSED TO ANYONE WITH COVID 19?     

## 2018-11-13 NOTE — Progress Notes (Signed)
lov cards/ clearance Melina Copa PA-C 04-14-18  ekg 04-14-18

## 2018-11-13 NOTE — Progress Notes (Signed)
Need orders. Pre-op is tomorrow 11-14-2018.

## 2018-11-13 NOTE — Patient Instructions (Addendum)
YOU ARE REQUIRED TO BE TESTED FOR COVID-19 PRIOR TO YOUR SURGERY . YOUR TEST MUST BE COMPLETED ON Tuesday November 25, 2018. TESTING IS LOCATED AT [facility] ENTRANCE FROM 9:00AM - 3:00PM. FAILURE TO COMPLETE TESTING MAY RESULT IN CANCELLATION OF YOUR SURGERY.                   Dennis Huffman  11/13/2018   Your procedure is scheduled on: 11-28-2018   Report to [facility] Main  Entrance     Report to [place] at 5:30AM      Call this number if you have problems the morning of surgery [phone]      Remember: Do not eat food or drink liquids :After Midnight. BRUSH YOUR TEETH MORNING OF SURGERY AND RINSE YOUR MOUTH OUT, NO CHEWING GUM CANDY OR MINTS.     Take these medicines the morning of surgery with A SIP OF WATER: Atorvastatin                                 You may not have any metal on your body including hair pins and              piercings  Do not wear jewelry, make-up, lotions, powders or perfumes, deodorant                   Men may shave face and neck.   Do not bring valuables to the hospital. [place].  Contacts, dentures or bridgework may not be worn into surgery.                Please read over the following fact sheets you were given: _____________________________________________________________________             [facility] - Preparing for Surgery Before surgery, you can play an important role.  Because skin is not sterile, your skin needs to be as free of germs as possible.  You can reduce the number of germs on your skin by washing with CHG (chlorahexidine gluconate) soap before surgery.  CHG is an antiseptic cleaner which kills germs and bonds with the skin to continue killing germs even after washing. Please DO NOT use if you have an allergy to CHG or antibacterial soaps.  If your skin becomes reddened/irritated stop using the CHG and inform your nurse when you  arrive at Short Stay. Do not shave (including legs and underarms) for at least 48 hours prior to the first CHG shower.  You may shave your face/neck. Please follow these instructions carefully:  1.  Shower with CHG Soap the night before surgery and the  morning of Surgery.  2.  If you choose to wash your hair, wash your hair first as usual with your  normal  shampoo.  3.  After you shampoo, rinse your hair and body thoroughly to remove the  shampoo.                           4.  Use CHG as you would any other liquid soap.  You can apply chg directly  to the skin and wash  Gently with a scrungie or clean washcloth.  5.  Apply the CHG Soap to your body ONLY FROM THE NECK DOWN.   Do not use on face/ open                           Wound or open sores. Avoid contact with eyes, ears mouth and genitals (private parts).                       Wash face,  Genitals (private parts) with your normal soap.             6.  Wash thoroughly, paying special attention to the area where your surgery  will be performed.  7.  Thoroughly rinse your body with warm water from the neck down.  8.  DO NOT shower/wash with your normal soap after using and rinsing off  the CHG Soap.                9.  Pat yourself dry with a clean towel.            10.  Wear clean pajamas.            11.  Place clean sheets on your bed the night of your first shower and do not  sleep with pets. Day of Surgery : Do not apply any lotions/deodorants the morning of surgery.  Please wear clean clothes to the hospital/surgery center.  FAILURE TO FOLLOW THESE INSTRUCTIONS MAY RESULT IN THE CANCELLATION OF YOUR SURGERY PATIENT SIGNATURE_________________________________  NURSE SIGNATURE__________________________________  ________________________________________________________________________

## 2018-11-14 ENCOUNTER — Ambulatory Visit: Payer: Self-pay | Admitting: Physician Assistant

## 2018-11-14 ENCOUNTER — Encounter (HOSPITAL_COMMUNITY)
Admission: RE | Admit: 2018-11-14 | Discharge: 2018-11-14 | Disposition: A | Discharge status: Home / Self Care | Payer: Medicare HMO | Source: Ambulatory Visit | Attending: Orthopedic Surgery | Admitting: Orthopedic Surgery

## 2018-11-14 ENCOUNTER — Encounter (HOSPITAL_COMMUNITY): Payer: Self-pay

## 2018-11-14 ENCOUNTER — Other Ambulatory Visit: Payer: Self-pay

## 2018-11-14 DIAGNOSIS — Z01812 Encounter for preprocedural laboratory examination: Secondary | ICD-10-CM | POA: Diagnosis not present

## 2018-11-14 LAB — CBC
HCT: 35.9 % — ABNORMAL LOW (ref 39.0–52.0)
Hemoglobin: 12.4 g/dL — ABNORMAL LOW (ref 13.0–17.0)
MCH: 28.2 pg (ref 26.0–34.0)
MCHC: 34.5 g/dL (ref 30.0–36.0)
MCV: 81.6 fL (ref 80.0–100.0)
Platelets: 212 10*3/uL (ref 150–400)
RBC: 4.4 MIL/uL (ref 4.22–5.81)
RDW: 13.5 % (ref 11.5–15.5)
WBC: 4 10*3/uL (ref 4.0–10.5)
nRBC: 0 % (ref 0.0–0.2)

## 2018-11-14 LAB — BASIC METABOLIC PANEL
Anion gap: 9 (ref 5–15)
BUN: 18 mg/dL (ref 8–23)
CO2: 25 mmol/L (ref 22–32)
Calcium: 9.9 mg/dL (ref 8.9–10.3)
Chloride: 106 mmol/L (ref 98–111)
Creatinine, Ser: 1.15 mg/dL (ref 0.61–1.24)
GFR calc Af Amer: >60 mL/min (ref >60)
GFR calc non Af Amer: >60 mL/min (ref >60)
Glucose, Bld: 82 mg/dL (ref 70–99)
Potassium: 4 mmol/L (ref 3.5–5.1)
Sodium: 140 mmol/L (ref 135–145)

## 2018-11-14 LAB — SURGICAL PCR SCREEN
MRSA, PCR: NEGATIVE
Staphylococcus aureus: NEGATIVE

## 2018-11-14 NOTE — H&P (Signed)
TOTAL HIP ADMISSION H&P  Patient is admitted for left total hip arthroplasty.  Subjective:  Chief Complaint: left hip pain  HPI: Dennis Huffman, 68 y.o. male, has a history of pain and functional disability in the left hip(s) due to arthritis and AVN and patient has failed non-surgical conservative treatments for greater than 12 weeks to include NSAID's and/or analgesics, corticosteriod injections, use of assistive devices and activity modification.  Onset of symptoms was gradual starting 5 years ago with gradually worsening course since that time.The patient noted no past surgery on the left hip(s).  Patient currently rates pain in the left hip at 8 out of 10 with activity. Patient has night pain, worsening of pain with activity and weight bearing, trendelenberg gait and pain that interfers with activities of daily living. Patient has evidence of joint space narrowing by imaging studies. This condition presents safety issues increasing the risk of falls.  There is no current active infection.  Patient Active Problem List   Diagnosis Date Noted  . Prostate cancer ([place]) 08/29/2016  . Neck pain 05/12/2015  . Bradycardia 04/21/2014  . Hyperlipidemia 04/21/2014   Past Medical History:  Diagnosis Date  . Arthritis    cervical spondylotic, radiculopathy  . Bradycardia    a. HR previously reported as 40 at PCP's office; event monitor 2017 showed nocturnal bradycardia but no daytime findings.  . Chronic kidney disease, stage III (moderate) ([place])   . Hypercholesteremia   . Hypertension   . Premature atrial contractions   . Prostate cancer ([place])   . PVC's (premature ventricular contractions)     Past Surgical History:  Procedure Laterality Date  . ANTERIOR CERVICAL DECOMP/DISCECTOMY FUSION N/A 05/12/2015   Procedure: ANTERIOR CERVICAL DECOMPRESSION/DISCECTOMY FUSION C3 - C6 3 LEVELS;  Surgeon: Melina Schools, MD;  Location: [place];  Service: Orthopedics;  Laterality: N/A;  . colonscopy    .  COLONSCOPY     . INSERTION PROSTATE RADIATION SEED  UNSURE OF DATE   DONE BY DR Karsten Ro   . PROSTATE BIOPSY    . ROTATOR CUFF REPAIR Right 03-13-12; 09-27-15    Current Outpatient Medications  Medication Sig Dispense Refill Last Dose  . acetaminophen (TYLENOL) 650 MG CR tablet Take 650-1,300 mg by mouth every 8 (eight) hours as needed for pain.     [name] amLODipine (NORVASC) 5 MG tablet Take 5 mg by mouth every evening.     [name] atorvastatin (LIPITOR) 10 MG tablet Take 10 mg by mouth daily.   Taking  . bismuth subsalicylate (PEPTO BISMOL) 262 MG/15ML suspension Take 30 mLs by mouth every 6 (six) hours as needed for indigestion.     [name] FERROUS SULFATE PO Take 1 tablet by mouth daily.     . Glucosamine-Chondroitin-MSM [phone] MG TABS Take 3 tablets by mouth daily.   Taking  . Homeopathic Products (LEG CRAMP RELIEF) TABS Take 1-2 tablets by mouth daily as needed (leg cramps).     . losartan (COZAAR) 100 MG tablet Take 100 mg by mouth daily.   Taking  . Melatonin 10 MG CAPS Take 10-20 mg by mouth at bedtime.     . Multiple Vitamin (MULTIVITAMIN WITH MINERALS) TABS tablet Take 2 tablets by mouth daily.     [name] OVER THE COUNTER MEDICATION Take 1 tablet by mouth daily. Kidney Factors otc supplement     . Probiotic CAPS Take 1 capsule by mouth daily.     . traMADol (ULTRAM) 50 MG tablet Take 50 mg by mouth 2 (  two) times daily as needed for moderate pain.     . vitamin B-12 (CYANOCOBALAMIN) 1000 MCG tablet Take 1,000 mcg by mouth daily.   Taking   No current facility-administered medications for this visit.    Allergies  Allergen Reactions  . Hydrocodone Palpitations    Social History   Tobacco Use  . Smoking status: Former Smoker    Last attempt to quit: 06/26/1971    Years since quitting: 47.4  . Smokeless tobacco: Never Used  Substance Use Topics  . Alcohol use: Yes    Comment: socially     Family History  Problem Relation Age of Onset  . Stroke Sister   . Cancer Sister        breast      Review of Systems  Musculoskeletal: Positive for joint pain.  Neurological: Positive for dizziness.  All other systems reviewed and are negative.   Objective:  Physical Exam  Constitutional: He is oriented to person, place, and time. He appears well-developed and well-nourished.  HENT:  Head: Normocephalic and atraumatic.  Nose: Nose normal.  Eyes: Pupils are equal, round, and reactive to light. Conjunctivae and EOM are normal.  Neck: Normal range of motion. Neck supple.  Cardiovascular: Normal rate, regular rhythm, normal heart sounds and intact distal pulses.  Respiratory: Effort normal and breath sounds normal. No respiratory distress. He has no wheezes.  GI: Soft. Bowel sounds are normal. He exhibits no distension. There is no abdominal tenderness.  Musculoskeletal:     Left hip: He exhibits decreased range of motion, decreased strength, tenderness and bony tenderness.  Lymphadenopathy:    He has no cervical adenopathy.  Neurological: He is alert and oriented to person, place, and time. No cranial nerve deficit.  Skin: Skin is warm and dry. No rash noted. No erythema.  Psychiatric: He has a normal mood and affect. His behavior is normal.    Vital signs in last 24 hours: @VSRANGES @  Labs:   Estimated body mass index is 31.94 kg/m as calculated from the following:   Height as of an earlier encounter on 11/14/18: 5\' 11"  (1.803 m).   Weight as of an earlier encounter on 11/14/18: 103.9 kg.   Imaging Review Plain radiographs demonstrate severe degenerative joint disease of the left hip(s). The bone quality appears to be good for age and reported activity level.      Assessment/Plan:  End stage arthritis, left hip(s)  The patient history, physical examination, clinical judgement of the provider and imaging studies are consistent with end stage degenerative joint disease of the left hip(s) and total hip arthroplasty is deemed medically necessary. The treatment options  including medical management, injection therapy, arthroscopy and arthroplasty were discussed at length. The risks and benefits of total hip arthroplasty were presented and reviewed. The risks due to aseptic loosening, infection, stiffness, dislocation/subluxation,  thromboembolic complications and other imponderables were discussed.  The patient acknowledged the explanation, agreed to proceed with the plan and consent was signed. Patient is being admitted for inpatient treatment for surgery, pain control, PT, OT, prophylactic antibiotics, VTE prophylaxis, progressive ambulation and ADL's and discharge planning.The patient is planning to be discharged home with home health services

## 2018-11-14 NOTE — H&P (View-Only) (Signed)
TOTAL HIP ADMISSION H&P  Patient is admitted for left total hip arthroplasty.  Subjective:  Chief Complaint: left hip pain  HPI: Dennis Huffman, 68 y.o. male, has a history of pain and functional disability in the left hip(s) due to arthritis and AVN and patient has failed non-surgical conservative treatments for greater than 12 weeks to include NSAID's and/or analgesics, corticosteriod injections, use of assistive devices and activity modification.  Onset of symptoms was gradual starting 5 years ago with gradually worsening course since that time.The patient noted no past surgery on the left hip(s).  Patient currently rates pain in the left hip at 8 out of 10 with activity. Patient has night pain, worsening of pain with activity and weight bearing, trendelenberg gait and pain that interfers with activities of daily living. Patient has evidence of joint space narrowing by imaging studies. This condition presents safety issues increasing the risk of falls.  There is no current active infection.  Patient Active Problem List   Diagnosis Date Noted  . Prostate cancer ([place]) 08/29/2016  . Neck pain 05/12/2015  . Bradycardia 04/21/2014  . Hyperlipidemia 04/21/2014   Past Medical History:  Diagnosis Date  . Arthritis    cervical spondylotic, radiculopathy  . Bradycardia    a. HR previously reported as 40 at PCP's office; event monitor 2017 showed nocturnal bradycardia but no daytime findings.  . Chronic kidney disease, stage III (moderate) ([place])   . Hypercholesteremia   . Hypertension   . Premature atrial contractions   . Prostate cancer ([place])   . PVC's (premature ventricular contractions)     Past Surgical History:  Procedure Laterality Date  . ANTERIOR CERVICAL DECOMP/DISCECTOMY FUSION N/A 05/12/2015   Procedure: ANTERIOR CERVICAL DECOMPRESSION/DISCECTOMY FUSION C3 - C6 3 LEVELS;  Surgeon: Melina Schools, MD;  Location: [place];  Service: Orthopedics;  Laterality: N/A;  . colonscopy    .  COLONSCOPY     . INSERTION PROSTATE RADIATION SEED  UNSURE OF DATE   DONE BY DR Karsten Ro   . PROSTATE BIOPSY    . ROTATOR CUFF REPAIR Right 03-13-12; 09-27-15    Current Outpatient Medications  Medication Sig Dispense Refill Last Dose  . acetaminophen (TYLENOL) 650 MG CR tablet Take 650-1,300 mg by mouth every 8 (eight) hours as needed for pain.     [name] amLODipine (NORVASC) 5 MG tablet Take 5 mg by mouth every evening.     [name] atorvastatin (LIPITOR) 10 MG tablet Take 10 mg by mouth daily.   Taking  . bismuth subsalicylate (PEPTO BISMOL) 262 MG/15ML suspension Take 30 mLs by mouth every 6 (six) hours as needed for indigestion.     [name] FERROUS SULFATE PO Take 1 tablet by mouth daily.     . Glucosamine-Chondroitin-MSM [phone] MG TABS Take 3 tablets by mouth daily.   Taking  . Homeopathic Products (LEG CRAMP RELIEF) TABS Take 1-2 tablets by mouth daily as needed (leg cramps).     . losartan (COZAAR) 100 MG tablet Take 100 mg by mouth daily.   Taking  . Melatonin 10 MG CAPS Take 10-20 mg by mouth at bedtime.     . Multiple Vitamin (MULTIVITAMIN WITH MINERALS) TABS tablet Take 2 tablets by mouth daily.     [name] OVER THE COUNTER MEDICATION Take 1 tablet by mouth daily. Kidney Factors otc supplement     . Probiotic CAPS Take 1 capsule by mouth daily.     . traMADol (ULTRAM) 50 MG tablet Take 50 mg by mouth 2 (  two) times daily as needed for moderate pain.     . vitamin B-12 (CYANOCOBALAMIN) 1000 MCG tablet Take 1,000 mcg by mouth daily.   Taking   No current facility-administered medications for this visit.    Allergies  Allergen Reactions  . Hydrocodone Palpitations    Social History   Tobacco Use  . Smoking status: Former Smoker    Last attempt to quit: 06/26/1971    Years since quitting: 47.4  . Smokeless tobacco: Never Used  Substance Use Topics  . Alcohol use: Yes    Comment: socially     Family History  Problem Relation Age of Onset  . Stroke Sister   . Cancer Sister        breast      Review of Systems  Musculoskeletal: Positive for joint pain.  Neurological: Positive for dizziness.  All other systems reviewed and are negative.   Objective:  Physical Exam  Constitutional: He is oriented to person, place, and time. He appears well-developed and well-nourished.  HENT:  Head: Normocephalic and atraumatic.  Nose: Nose normal.  Eyes: Pupils are equal, round, and reactive to light. Conjunctivae and EOM are normal.  Neck: Normal range of motion. Neck supple.  Cardiovascular: Normal rate, regular rhythm, normal heart sounds and intact distal pulses.  Respiratory: Effort normal and breath sounds normal. No respiratory distress. He has no wheezes.  GI: Soft. Bowel sounds are normal. He exhibits no distension. There is no abdominal tenderness.  Musculoskeletal:     Left hip: He exhibits decreased range of motion, decreased strength, tenderness and bony tenderness.  Lymphadenopathy:    He has no cervical adenopathy.  Neurological: He is alert and oriented to person, place, and time. No cranial nerve deficit.  Skin: Skin is warm and dry. No rash noted. No erythema.  Psychiatric: He has a normal mood and affect. His behavior is normal.    Vital signs in last 24 hours: @VSRANGES @  Labs:   Estimated body mass index is 31.94 kg/m as calculated from the following:   Height as of an earlier encounter on 11/14/18: 5\' 11"  (1.803 m).   Weight as of an earlier encounter on 11/14/18: 103.9 kg.   Imaging Review Plain radiographs demonstrate severe degenerative joint disease of the left hip(s). The bone quality appears to be good for age and reported activity level.      Assessment/Plan:  End stage arthritis, left hip(s)  The patient history, physical examination, clinical judgement of the provider and imaging studies are consistent with end stage degenerative joint disease of the left hip(s) and total hip arthroplasty is deemed medically necessary. The treatment options  including medical management, injection therapy, arthroscopy and arthroplasty were discussed at length. The risks and benefits of total hip arthroplasty were presented and reviewed. The risks due to aseptic loosening, infection, stiffness, dislocation/subluxation,  thromboembolic complications and other imponderables were discussed.  The patient acknowledged the explanation, agreed to proceed with the plan and consent was signed. Patient is being admitted for inpatient treatment for surgery, pain control, PT, OT, prophylactic antibiotics, VTE prophylaxis, progressive ambulation and ADL's and discharge planning.The patient is planning to be discharged home with home health services

## 2018-11-18 NOTE — Progress Notes (Signed)
Anesthesia Chart Review   Case:  [id number] Date/Time:  11/28/18 0715   Procedure:  TOTAL HIP ARTHROPLASTY (Left )   Anesthesia type:  Choice   Pre-op diagnosis:  OA LEFT HIP   Location:  [place] / [facility]   Surgeon:  Earlie Server, MD      DISCUSSION:68 yo former smoker (quit 06/26/71) with h/o hypercholesteremia, prostate cancer, PVC's, HTN, CKD Stage III, OA left hip scheduled for above procedure 11/28/18 with Dr. Earlie Server.   Pt cleared by cardiology 04/14/18.  Per Melina Copa, PA-C, "revised cardiac risk index is 0.4% indicating low risk of cardiac complications. He is able to complete over 4 METS without any anginal symptoms. Therefore, based on ACC/AHA guidelines, Dennis Huffman would be at acceptable risk for the planned procedure without further cardiovascular testing. I will route this recommendation to the requesting party via Epic fax function and remove from pre-op pool. I also signed his form for him to take back to surgeon. He also plans to see his PCP for general medical clearance as well."  PCP clearance on chart.   Pt can proceed with planned procedure barring acute status change.  VS: BP 125/82   Pulse 79   Temp 37.1 C (Oral)   Resp 16   Ht 5\' 11"  (1.803 m)   Wt 103.9 kg   SpO2 96%   BMI 31.94 kg/m   PROVIDERS: [name], MD is PCP    LABS: Labs reviewed: Acceptable for surgery. (all labs ordered are listed, but only abnormal results are displayed)  Labs Reviewed  CBC - Abnormal; Notable for the following components:      Result Value   Hemoglobin 12.4 (*)    HCT 35.9 (*)    All other components within normal limits  SURGICAL PCR SCREEN  BASIC METABOLIC PANEL     IMAGES:   EKG: 04/14/2018 Rate 79 bpm Normal sinus rhythm  Cannot rule out Anterior infarct, age undetermined No significant change.   CV:  Past Medical History:  Diagnosis Date  . Arthritis    cervical spondylotic, radiculopathy  . Bradycardia    a. HR previously  reported as 40 at PCP's office; event monitor 2017 showed nocturnal bradycardia but no daytime findings.  . Chronic kidney disease, stage III (moderate) ([place])   . Hypercholesteremia   . Hypertension   . Premature atrial contractions   . Prostate cancer ([place])   . PVC's (premature ventricular contractions)     Past Surgical History:  Procedure Laterality Date  . ANTERIOR CERVICAL DECOMP/DISCECTOMY FUSION N/A 05/12/2015   Procedure: ANTERIOR CERVICAL DECOMPRESSION/DISCECTOMY FUSION C3 - C6 3 LEVELS;  Surgeon: Melina Schools, MD;  Location: [place];  Service: Orthopedics;  Laterality: N/A;  . colonscopy    . COLONSCOPY     . INSERTION PROSTATE RADIATION SEED  UNSURE OF DATE   DONE BY DR Karsten Ro   . PROSTATE BIOPSY    . ROTATOR CUFF REPAIR Right 03-13-12; 09-27-15    MEDICATIONS: . acetaminophen (TYLENOL) 650 MG CR tablet  . amLODipine (NORVASC) 5 MG tablet  . atorvastatin (LIPITOR) 10 MG tablet  . bismuth subsalicylate (PEPTO BISMOL) 262 MG/15ML suspension  . FERROUS SULFATE PO  . Glucosamine-Chondroitin-MSM [id number] MG TABS  . Homeopathic Products (LEG CRAMP RELIEF) TABS  . losartan (COZAAR) 100 MG tablet  . Melatonin 10 MG CAPS  . Multiple Vitamin (MULTIVITAMIN WITH MINERALS) TABS tablet  . OVER THE COUNTER MEDICATION  . Probiotic CAPS  . traMADol (ULTRAM)  50 MG tablet  . vitamin B-12 (CYANOCOBALAMIN) 1000 MCG tablet   No current facility-administered medications for this encounter.      Maia Plan [facility] Pre-Surgical Testing [phone] 11/18/18 3:34 PM

## 2018-11-18 NOTE — Anesthesia Preprocedure Evaluation (Addendum)
Anesthesia Evaluation  Patient identified by MRN, date of birth, ID band Patient awake    Reviewed: Allergy & Precautions, NPO status , Patient's Chart, lab work & pertinent test results  Airway Mallampati: II       Dental no notable dental hx. (+) Teeth Intact   Pulmonary former smoker,    Pulmonary exam normal breath sounds clear to auscultation       Cardiovascular hypertension, Pt. on medications Normal cardiovascular exam Rhythm:Regular Rate:Normal     Neuro/Psych negative psych ROS   GI/Hepatic   Endo/Other    Renal/GU      Musculoskeletal  (+) Arthritis , Osteoarthritis,    Abdominal (+) + obese,   Peds  Hematology  (+) Blood dyscrasia, anemia ,   Anesthesia Other Findings DUB MACLELLAN MONITOR 24HR HOOKUP AND INTERPRETATION  Order# [id number]  Reading physician: Jettie Booze, MD Ordering physician: Jettie Booze, MD Study date: 01/19/16 Patient Information   Name MRN Description Dennis Huffman [id number] 68 y.o. male Result Notes for Holter monitor - 24 hour   Notes Recorded by Deliah Boston Via, LPN on 01/01/1504 at 6:97 PM EDT The pt is advised and he verbalized understanding. He thanked me for calling back. ------  Notes Recorded by Deliah Boston Via, LPN on 02/27/8015 at 5:53 PM EDT LMTCB. ------  Notes Recorded by Jettie Booze, MD on 02/29/2016 at 9:15 AM EDT THe PACs and PVCs that he has are very common. Everyone has these extra beats, even when they are not on medicine. They are not dangerous. Try to minimize caffeine intake which can make these beats more common. I don't thik changing those two medicines will make a difference. ------    Reproductive/Obstetrics                           Anesthesia Physical Anesthesia Plan  ASA: II  Anesthesia Plan: Spinal   Post-op Pain Management:    Induction:   PONV Risk Score and Plan: 2 and  Ondansetron and Dexamethasone  Airway Management Planned: Natural Airway, Nasal Cannula and Simple Face Mask  Additional Equipment:   Intra-op Plan:   Post-operative Plan:   Informed Consent: I have reviewed the patients History and Physical, chart, labs and discussed the procedure including the risks, benefits and alternatives for the proposed anesthesia with the patient or authorized representative who has indicated his/her understanding and acceptance.       Plan Discussed with: CRNA and Surgeon  Anesthesia Plan Comments: (See PAT note 11/14/18, Konrad Felix, PA-C)      Anesthesia Quick Evaluation

## 2018-11-25 ENCOUNTER — Other Ambulatory Visit (HOSPITAL_COMMUNITY)
Admission: RE | Admit: 2018-11-25 | Discharge: 2018-11-25 | Disposition: A | Discharge status: Home / Self Care | Payer: Medicare HMO | Source: Ambulatory Visit | Attending: Orthopedic Surgery | Admitting: Orthopedic Surgery

## 2018-11-25 DIAGNOSIS — Z1159 Encounter for screening for other viral diseases: Secondary | ICD-10-CM | POA: Diagnosis not present

## 2018-11-26 DIAGNOSIS — N183 Chronic kidney disease, stage 3 (moderate): Secondary | ICD-10-CM | POA: Diagnosis not present

## 2018-11-26 DIAGNOSIS — I700 Atherosclerosis of aorta: Secondary | ICD-10-CM | POA: Diagnosis not present

## 2018-11-26 DIAGNOSIS — R7303 Prediabetes: Secondary | ICD-10-CM | POA: Diagnosis not present

## 2018-11-26 DIAGNOSIS — M519 Unspecified thoracic, thoracolumbar and lumbosacral intervertebral disc disorder: Secondary | ICD-10-CM | POA: Diagnosis not present

## 2018-11-26 DIAGNOSIS — C61 Malignant neoplasm of prostate: Secondary | ICD-10-CM | POA: Diagnosis not present

## 2018-11-26 DIAGNOSIS — Z0000 Encounter for general adult medical examination without abnormal findings: Secondary | ICD-10-CM | POA: Diagnosis not present

## 2018-11-26 DIAGNOSIS — Z1389 Encounter for screening for other disorder: Secondary | ICD-10-CM | POA: Diagnosis not present

## 2018-11-26 DIAGNOSIS — I10 Essential (primary) hypertension: Secondary | ICD-10-CM | POA: Diagnosis not present

## 2018-11-26 DIAGNOSIS — E7800 Pure hypercholesterolemia, unspecified: Secondary | ICD-10-CM | POA: Diagnosis not present

## 2018-11-26 DIAGNOSIS — M169 Osteoarthritis of hip, unspecified: Secondary | ICD-10-CM | POA: Diagnosis not present

## 2018-11-26 LAB — NOVEL CORONAVIRUS, NAA (HOSP ORDER, SEND-OUT TO REF LAB; TAT 18-24 HRS): SARS-CoV-2, NAA: NOT DETECTED

## 2018-11-27 ENCOUNTER — Encounter (HOSPITAL_COMMUNITY): Payer: Self-pay | Admitting: Anesthesiology

## 2018-11-27 MED ORDER — BUPIVACAINE LIPOSOME 1.3 % IJ SUSP
10.0000 mL | INTRAMUSCULAR | Status: DC
  Filled 2018-11-27: qty 10

## 2018-11-27 MED ORDER — TRANEXAMIC ACID 1000 MG/10ML IV SOLN
2000.0000 mg | INTRAVENOUS | Status: DC
  Filled 2018-11-27: qty 20

## 2018-11-27 NOTE — Progress Notes (Signed)
SPOKE W/  _ Jacquelynn Cree      SCREENING SYMPTOMS OF COVID 19:   COUGH--NO  RUNNY NOSE---NO  SORE THROAT---NO  NASAL CONGESTION----NO  SNEEZING----NO  SHORTNESS OF BREATH---NO  DIFFICULTY BREATHING---NO  TEMP >100.0 -----NO  UNEXPLAINED BODY ACHES------NO  CHILLS --------NO   HEADACHES ---------NO  LOSS OF SMELL/ TASTE --------NO    HAVE YOU OR ANY FAMILY MEMBER TRAVELLED PAST 14 DAYS OUT OF THE   COUNTY---NO STATE----NO COUNTRY----NO  HAVE YOU OR ANY FAMILY MEMBER BEEN EXPOSED TO ANYONE WITH COVID 19? NO

## 2018-11-28 ENCOUNTER — Encounter (HOSPITAL_COMMUNITY): Payer: Self-pay | Admitting: *Deleted

## 2018-11-28 ENCOUNTER — Inpatient Hospital Stay (HOSPITAL_COMMUNITY): Payer: Medicare HMO | Admitting: Certified Registered"

## 2018-11-28 ENCOUNTER — Inpatient Hospital Stay (HOSPITAL_COMMUNITY)
Admission: RE | Admit: 2018-11-28 | Discharge: 2018-11-29 | DRG: 470 | Disposition: A | Discharge status: Home Health | Payer: Medicare HMO | Attending: Orthopedic Surgery | Admitting: Orthopedic Surgery

## 2018-11-28 ENCOUNTER — Other Ambulatory Visit: Payer: Self-pay

## 2018-11-28 ENCOUNTER — Inpatient Hospital Stay (HOSPITAL_COMMUNITY): Payer: Medicare HMO

## 2018-11-28 ENCOUNTER — Encounter (HOSPITAL_COMMUNITY)
Admission: RE | Disposition: A | Discharge status: Home Health | Payer: Self-pay | Source: Home / Self Care | Attending: Orthopedic Surgery

## 2018-11-28 ENCOUNTER — Inpatient Hospital Stay (HOSPITAL_COMMUNITY): Payer: Medicare HMO | Admitting: Physician Assistant

## 2018-11-28 DIAGNOSIS — M1612 Unilateral primary osteoarthritis, left hip: Secondary | ICD-10-CM | POA: Diagnosis not present

## 2018-11-28 DIAGNOSIS — Z823 Family history of stroke: Secondary | ICD-10-CM | POA: Diagnosis not present

## 2018-11-28 DIAGNOSIS — Z79899 Other long term (current) drug therapy: Secondary | ICD-10-CM

## 2018-11-28 DIAGNOSIS — N183 Chronic kidney disease, stage 3 (moderate): Secondary | ICD-10-CM | POA: Diagnosis present

## 2018-11-28 DIAGNOSIS — E669 Obesity, unspecified: Secondary | ICD-10-CM | POA: Diagnosis not present

## 2018-11-28 DIAGNOSIS — I129 Hypertensive chronic kidney disease with stage 1 through stage 4 chronic kidney disease, or unspecified chronic kidney disease: Secondary | ICD-10-CM | POA: Diagnosis not present

## 2018-11-28 DIAGNOSIS — Z8546 Personal history of malignant neoplasm of prostate: Secondary | ICD-10-CM | POA: Diagnosis not present

## 2018-11-28 DIAGNOSIS — E7800 Pure hypercholesterolemia, unspecified: Secondary | ICD-10-CM | POA: Diagnosis not present

## 2018-11-28 DIAGNOSIS — Z885 Allergy status to narcotic agent status: Secondary | ICD-10-CM

## 2018-11-28 DIAGNOSIS — Z981 Arthrodesis status: Secondary | ICD-10-CM

## 2018-11-28 DIAGNOSIS — Z87891 Personal history of nicotine dependence: Secondary | ICD-10-CM | POA: Diagnosis not present

## 2018-11-28 DIAGNOSIS — Z6831 Body mass index (BMI) 31.0-31.9, adult: Secondary | ICD-10-CM

## 2018-11-28 DIAGNOSIS — D649 Anemia, unspecified: Secondary | ICD-10-CM | POA: Diagnosis not present

## 2018-11-28 DIAGNOSIS — Z803 Family history of malignant neoplasm of breast: Secondary | ICD-10-CM | POA: Diagnosis not present

## 2018-11-28 DIAGNOSIS — M25552 Pain in left hip: Secondary | ICD-10-CM | POA: Diagnosis present

## 2018-11-28 DIAGNOSIS — C61 Malignant neoplasm of prostate: Secondary | ICD-10-CM | POA: Diagnosis not present

## 2018-11-28 DIAGNOSIS — I10 Essential (primary) hypertension: Secondary | ICD-10-CM | POA: Diagnosis not present

## 2018-11-28 DIAGNOSIS — Z96642 Presence of left artificial hip joint: Secondary | ICD-10-CM | POA: Diagnosis not present

## 2018-11-28 DIAGNOSIS — Z471 Aftercare following joint replacement surgery: Secondary | ICD-10-CM | POA: Diagnosis not present

## 2018-11-28 HISTORY — PX: TOTAL HIP ARTHROPLASTY: SHX124

## 2018-11-28 SURGERY — ARTHROPLASTY, HIP, TOTAL,POSTERIOR APPROACH
Anesthesia: Spinal | Laterality: Left

## 2018-11-28 MED ORDER — METOCLOPRAMIDE HCL 5 MG PO TABS: 5.0000 mg | ORAL_TABLET | Freq: Three times a day (TID) | ORAL | Status: DC | PRN

## 2018-11-28 MED ORDER — MIDAZOLAM HCL 2 MG/2ML IJ SOLN
INTRAMUSCULAR | Status: AC
  Filled 2018-11-28: qty 2

## 2018-11-28 MED ORDER — ACETAMINOPHEN 325 MG PO TABS: 325.0000 mg | ORAL_TABLET | Freq: Four times a day (QID) | ORAL | Status: DC | PRN

## 2018-11-28 MED ORDER — FLEET ENEMA 7-19 GM/118ML RE ENEM: 1.0000 | ENEMA | Freq: Once | RECTAL | Status: DC | PRN

## 2018-11-28 MED ORDER — FENTANYL CITRATE (PF) 100 MCG/2ML IJ SOLN
INTRAMUSCULAR | Status: AC
  Filled 2018-11-28: qty 2

## 2018-11-28 MED ORDER — METHOCARBAMOL 500 MG PO TABS
500.0000 mg | ORAL_TABLET | Freq: Four times a day (QID) | ORAL | Status: DC | PRN
  Administered 2018-11-28: 500 mg via ORAL
  Filled 2018-11-28: qty 1

## 2018-11-28 MED ORDER — DEXAMETHASONE SODIUM PHOSPHATE 10 MG/ML IJ SOLN
INTRAMUSCULAR | Status: AC
  Filled 2018-11-28: qty 1

## 2018-11-28 MED ORDER — ASPIRIN EC 81 MG PO TBEC: 81.0000 mg | DELAYED_RELEASE_TABLET | Freq: Two times a day (BID) | ORAL | 0 refills | Status: AC

## 2018-11-28 MED ORDER — DOCUSATE SODIUM 100 MG PO CAPS
100.0000 mg | ORAL_CAPSULE | Freq: Two times a day (BID) | ORAL | Status: DC
  Administered 2018-11-28 – 2018-11-29 (×3): 100 mg via ORAL
  Filled 2018-11-28 (×3): qty 1

## 2018-11-28 MED ORDER — FENTANYL CITRATE (PF) 100 MCG/2ML IJ SOLN
INTRAMUSCULAR | Status: DC | PRN
  Administered 2018-11-28: 25 ug via INTRAVENOUS
  Administered 2018-11-28: 50 ug via INTRAVENOUS
  Administered 2018-11-28: 25 ug via INTRAVENOUS

## 2018-11-28 MED ORDER — LACTATED RINGERS IV SOLN
INTRAVENOUS | Status: DC
  Administered 2018-11-28 (×2): via INTRAVENOUS

## 2018-11-28 MED ORDER — LIDOCAINE 2% (20 MG/ML) 5 ML SYRINGE
INTRAMUSCULAR | Status: AC
  Filled 2018-11-28: qty 5

## 2018-11-28 MED ORDER — SODIUM CHLORIDE 0.9 % IV SOLN: INTRAVENOUS | Status: DC

## 2018-11-28 MED ORDER — PHENOL 1.4 % MT LIQD
1.0000 | OROMUCOSAL | Status: DC | PRN
  Filled 2018-11-28: qty 177

## 2018-11-28 MED ORDER — PHENYLEPHRINE HCL (PRESSORS) 10 MG/ML IV SOLN
INTRAVENOUS | Status: AC
  Filled 2018-11-28: qty 2

## 2018-11-28 MED ORDER — PROPOFOL 10 MG/ML IV BOLUS
INTRAVENOUS | Status: DC | PRN
  Administered 2018-11-28 (×2): 20 mg via INTRAVENOUS

## 2018-11-28 MED ORDER — CEFAZOLIN SODIUM-DEXTROSE 2-4 GM/100ML-% IV SOLN
2.0000 g | INTRAVENOUS | Status: AC
  Administered 2018-11-28: 2 g via INTRAVENOUS
  Filled 2018-11-28: qty 100

## 2018-11-28 MED ORDER — DEXAMETHASONE SODIUM PHOSPHATE 10 MG/ML IJ SOLN
INTRAMUSCULAR | Status: DC | PRN
  Administered 2018-11-28: 10 mg via INTRAVENOUS

## 2018-11-28 MED ORDER — METHOCARBAMOL 500 MG IVPB - SIMPLE MED
500.0000 mg | Freq: Four times a day (QID) | INTRAVENOUS | Status: DC | PRN
  Filled 2018-11-28: qty 50

## 2018-11-28 MED ORDER — POLYETHYLENE GLYCOL 3350 17 G PO PACK: 17.0000 g | PACK | Freq: Every day | ORAL | Status: DC | PRN

## 2018-11-28 MED ORDER — PROPOFOL 10 MG/ML IV BOLUS
INTRAVENOUS | Status: AC
  Filled 2018-11-28: qty 40

## 2018-11-28 MED ORDER — SODIUM CHLORIDE 0.9 % IV SOLN
INTRAVENOUS | Status: DC | PRN
  Administered 2018-11-28: 20 ug/min via INTRAVENOUS

## 2018-11-28 MED ORDER — GLYCOPYRROLATE PF 0.2 MG/ML IJ SOSY
PREFILLED_SYRINGE | INTRAMUSCULAR | Status: AC
  Filled 2018-11-28: qty 1

## 2018-11-28 MED ORDER — WATER FOR IRRIGATION, STERILE IR SOLN
Status: DC | PRN
  Administered 2018-11-28: 2000 mL via TOPICAL

## 2018-11-28 MED ORDER — TRANEXAMIC ACID-NACL 1000-0.7 MG/100ML-% IV SOLN
1000.0000 mg | Freq: Once | INTRAVENOUS | Status: AC
  Administered 2018-11-28: 1000 mg via INTRAVENOUS
  Filled 2018-11-28: qty 100

## 2018-11-28 MED ORDER — MEPERIDINE HCL 50 MG/ML IJ SOLN: 6.2500 mg | INTRAMUSCULAR | Status: DC | PRN

## 2018-11-28 MED ORDER — BUPIVACAINE LIPOSOME 1.3 % IJ SUSP
INTRAMUSCULAR | Status: DC | PRN
  Administered 2018-11-28: 10 mL

## 2018-11-28 MED ORDER — ONDANSETRON HCL 4 MG PO TABS: 4.0000 mg | ORAL_TABLET | Freq: Four times a day (QID) | ORAL | Status: DC | PRN

## 2018-11-28 MED ORDER — CHLORHEXIDINE GLUCONATE 4 % EX LIQD: 60.0000 mL | Freq: Once | CUTANEOUS | Status: DC

## 2018-11-28 MED ORDER — SORBITOL 70 % SOLN
30.0000 mL | Freq: Every day | Status: DC | PRN
  Filled 2018-11-28: qty 30

## 2018-11-28 MED ORDER — ACETAMINOPHEN 500 MG PO TABS
1000.0000 mg | ORAL_TABLET | Freq: Four times a day (QID) | ORAL | Status: AC
  Administered 2018-11-28 – 2018-11-29 (×4): 1000 mg via ORAL
  Filled 2018-11-28 (×4): qty 2

## 2018-11-28 MED ORDER — PROPOFOL 10 MG/ML IV BOLUS
INTRAVENOUS | Status: AC
  Filled 2018-11-28: qty 20

## 2018-11-28 MED ORDER — OXYCODONE HCL 5 MG PO TABS
5.0000 mg | ORAL_TABLET | ORAL | Status: DC | PRN
  Administered 2018-11-28: 5 mg via ORAL
  Filled 2018-11-28: qty 1

## 2018-11-28 MED ORDER — PHENYLEPHRINE 40 MCG/ML (10ML) SYRINGE FOR IV PUSH (FOR BLOOD PRESSURE SUPPORT)
PREFILLED_SYRINGE | INTRAVENOUS | Status: DC | PRN
  Administered 2018-11-28: 80 ug via INTRAVENOUS

## 2018-11-28 MED ORDER — METOCLOPRAMIDE HCL 5 MG/ML IJ SOLN: 5.0000 mg | Freq: Three times a day (TID) | INTRAMUSCULAR | Status: DC | PRN

## 2018-11-28 MED ORDER — OXYCODONE HCL 5 MG PO TABS: ORAL_TABLET | ORAL | 0 refills | Status: DC

## 2018-11-28 MED ORDER — DIPHENHYDRAMINE HCL 12.5 MG/5ML PO ELIX: 12.5000 mg | ORAL_SOLUTION | ORAL | Status: DC | PRN

## 2018-11-28 MED ORDER — POVIDONE-IODINE 10 % EX SWAB: 2.0000 "application " | Freq: Once | CUTANEOUS | Status: DC

## 2018-11-28 MED ORDER — CEFAZOLIN SODIUM-DEXTROSE 1-4 GM/50ML-% IV SOLN
1.0000 g | Freq: Four times a day (QID) | INTRAVENOUS | Status: AC
  Administered 2018-11-28 (×2): 1 g via INTRAVENOUS
  Filled 2018-11-28 (×3): qty 50

## 2018-11-28 MED ORDER — ONDANSETRON HCL 4 MG/2ML IJ SOLN
INTRAMUSCULAR | Status: AC
  Filled 2018-11-28: qty 2

## 2018-11-28 MED ORDER — FENTANYL CITRATE (PF) 100 MCG/2ML IJ SOLN: 25.0000 ug | INTRAMUSCULAR | Status: DC | PRN

## 2018-11-28 MED ORDER — ASPIRIN 81 MG PO CHEW
81.0000 mg | CHEWABLE_TABLET | Freq: Two times a day (BID) | ORAL | Status: DC
  Administered 2018-11-28 – 2018-11-29 (×2): 81 mg via ORAL
  Filled 2018-11-28 (×2): qty 1

## 2018-11-28 MED ORDER — TRANEXAMIC ACID 1000 MG/10ML IV SOLN
INTRAVENOUS | Status: DC | PRN
  Administered 2018-11-28: 2000 mg via TOPICAL

## 2018-11-28 MED ORDER — FERROUS SULFATE 325 (65 FE) MG PO TABS
325.0000 mg | ORAL_TABLET | Freq: Every day | ORAL | Status: DC
  Administered 2018-11-29: 325 mg via ORAL
  Filled 2018-11-28: qty 1

## 2018-11-28 MED ORDER — SODIUM CHLORIDE 0.9 % IV SOLN
INTRAVENOUS | Status: DC
  Administered 2018-11-28: 13:00:00 via INTRAVENOUS

## 2018-11-28 MED ORDER — EPHEDRINE SULFATE-NACL 50-0.9 MG/10ML-% IV SOSY
PREFILLED_SYRINGE | INTRAVENOUS | Status: DC | PRN
  Administered 2018-11-28 (×4): 5 mg via INTRAVENOUS

## 2018-11-28 MED ORDER — MIDAZOLAM HCL 5 MG/5ML IJ SOLN
INTRAMUSCULAR | Status: DC | PRN
  Administered 2018-11-28: 2 mg via INTRAVENOUS

## 2018-11-28 MED ORDER — ONDANSETRON HCL 4 MG/2ML IJ SOLN: 4.0000 mg | Freq: Four times a day (QID) | INTRAMUSCULAR | Status: DC | PRN

## 2018-11-28 MED ORDER — PROPOFOL 10 MG/ML IV BOLUS
INTRAVENOUS | Status: AC
  Filled 2018-11-28: qty 60

## 2018-11-28 MED ORDER — BUPIVACAINE-EPINEPHRINE (PF) 0.25% -1:200000 IJ SOLN
INTRAMUSCULAR | Status: AC
  Filled 2018-11-28: qty 30

## 2018-11-28 MED ORDER — LOSARTAN POTASSIUM 50 MG PO TABS
100.0000 mg | ORAL_TABLET | Freq: Every day | ORAL | Status: DC
  Administered 2018-11-28 – 2018-11-29 (×2): 100 mg via ORAL
  Filled 2018-11-28 (×2): qty 2

## 2018-11-28 MED ORDER — SODIUM CHLORIDE (PF) 0.9 % IJ SOLN
INTRAMUSCULAR | Status: DC | PRN
  Administered 2018-11-28: 20 mL

## 2018-11-28 MED ORDER — HYDROMORPHONE HCL 1 MG/ML IJ SOLN
0.5000 mg | INTRAMUSCULAR | Status: DC | PRN
  Administered 2018-11-28: 0.5 mg via INTRAVENOUS
  Filled 2018-11-28: qty 1

## 2018-11-28 MED ORDER — ONDANSETRON HCL 4 MG/2ML IJ SOLN
INTRAMUSCULAR | Status: DC | PRN
  Administered 2018-11-28: 4 mg via INTRAVENOUS

## 2018-11-28 MED ORDER — KETOROLAC TROMETHAMINE 30 MG/ML IJ SOLN: 30.0000 mg | Freq: Once | INTRAMUSCULAR | Status: DC | PRN

## 2018-11-28 MED ORDER — PROPOFOL 500 MG/50ML IV EMUL
INTRAVENOUS | Status: DC | PRN
  Administered 2018-11-28: 30 ug/kg/min via INTRAVENOUS

## 2018-11-28 MED ORDER — SODIUM CHLORIDE (PF) 0.9 % IJ SOLN
INTRAMUSCULAR | Status: AC
  Filled 2018-11-28: qty 50

## 2018-11-28 MED ORDER — GLYCOPYRROLATE PF 0.2 MG/ML IJ SOSY
PREFILLED_SYRINGE | INTRAMUSCULAR | Status: DC | PRN
  Administered 2018-11-28 (×2): .2 mg via INTRAVENOUS

## 2018-11-28 MED ORDER — ATORVASTATIN CALCIUM 10 MG PO TABS
10.0000 mg | ORAL_TABLET | Freq: Every day | ORAL | Status: DC
  Administered 2018-11-28 – 2018-11-29 (×2): 10 mg via ORAL
  Filled 2018-11-28 (×3): qty 1

## 2018-11-28 MED ORDER — SODIUM CHLORIDE 0.9 % IR SOLN
Status: DC | PRN
  Administered 2018-11-28: 1000 mL

## 2018-11-28 MED ORDER — TRANEXAMIC ACID-NACL 1000-0.7 MG/100ML-% IV SOLN
1000.0000 mg | INTRAVENOUS | Status: AC
  Administered 2018-11-28: 1000 mg via INTRAVENOUS
  Filled 2018-11-28: qty 100

## 2018-11-28 MED ORDER — PROMETHAZINE HCL 25 MG/ML IJ SOLN: 6.2500 mg | INTRAMUSCULAR | Status: DC | PRN

## 2018-11-28 MED ORDER — BISMUTH SUBSALICYLATE 262 MG/15ML PO SUSP
30.0000 mL | Freq: Four times a day (QID) | ORAL | Status: DC | PRN
  Filled 2018-11-28: qty 236

## 2018-11-28 MED ORDER — MENTHOL 3 MG MT LOZG: 1.0000 | LOZENGE | OROMUCOSAL | Status: DC | PRN

## 2018-11-28 MED ORDER — EPHEDRINE 5 MG/ML INJ
INTRAVENOUS | Status: AC
  Filled 2018-11-28: qty 10

## 2018-11-28 MED ORDER — BUPIVACAINE-EPINEPHRINE 0.25% -1:200000 IJ SOLN
INTRAMUSCULAR | Status: DC | PRN
  Administered 2018-11-28: 20 mL

## 2018-11-28 MED ORDER — BUPIVACAINE IN DEXTROSE 0.75-8.25 % IT SOLN
INTRATHECAL | Status: DC | PRN
  Administered 2018-11-28: 2 mL via INTRATHECAL

## 2018-11-28 MED ORDER — PHENYLEPHRINE 40 MCG/ML (10ML) SYRINGE FOR IV PUSH (FOR BLOOD PRESSURE SUPPORT)
PREFILLED_SYRINGE | INTRAVENOUS | Status: AC
  Filled 2018-11-28: qty 10

## 2018-11-28 MED ORDER — AMLODIPINE BESYLATE 5 MG PO TABS
5.0000 mg | ORAL_TABLET | Freq: Every evening | ORAL | Status: DC
  Administered 2018-11-28: 5 mg via ORAL
  Filled 2018-11-28: qty 1

## 2018-11-28 SURGICAL SUPPLY — 57 items
BAG DECANTER FOR FLEXI CONT (MISCELLANEOUS) ×2 IMPLANT
BAG ZIPLOCK 12X15 (MISCELLANEOUS) ×2 IMPLANT
BENZOIN TINCTURE PRP APPL 2/3 (GAUZE/BANDAGES/DRESSINGS) ×1 IMPLANT
BLADE SAW SGTL 73X25 THK (BLADE) ×2 IMPLANT
COVER SURGICAL LIGHT HANDLE (MISCELLANEOUS) ×2 IMPLANT
COVER WAND RF STERILE (DRAPES) IMPLANT
CUP ACET PNNCL SECTR W/GRIP 56 (Hips) IMPLANT
DECANTER SPIKE VIAL GLASS SM (MISCELLANEOUS) ×6 IMPLANT
DRAPE INCISE IOBAN 66X45 STRL (DRAPES) ×2 IMPLANT
DRAPE ORTHO SPLIT 77X108 STRL (DRAPES) ×2
DRAPE POUCH INSTRU U-SHP 10X18 (DRAPES) ×2 IMPLANT
DRAPE SURG ORHT 6 SPLT 77X108 (DRAPES) ×2 IMPLANT
DRAPE U-SHAPE 47X51 STRL (DRAPES) ×2 IMPLANT
DRESSING AQUACEL AG SP 3.5X10 (GAUZE/BANDAGES/DRESSINGS) IMPLANT
DRESSING VAC ATS ABD (GAUZE/BANDAGES/DRESSINGS) ×2 IMPLANT
DRSG ADAPTIC 3X8 NADH LF (GAUZE/BANDAGES/DRESSINGS) ×1 IMPLANT
DRSG AQUACEL AG SP 3.5X10 (GAUZE/BANDAGES/DRESSINGS) ×2
DRSG EMULSION OIL 3X16 NADH (GAUZE/BANDAGES/DRESSINGS) ×2 IMPLANT
DRSG PAD ABDOMINAL 8X10 ST (GAUZE/BANDAGES/DRESSINGS) ×4 IMPLANT
ELECT BLADE TIP CTD 4 INCH (ELECTRODE) ×2 IMPLANT
ELECT REM PT RETURN 15FT ADLT (MISCELLANEOUS) ×2 IMPLANT
FACESHIELD WRAPAROUND (MASK) ×6 IMPLANT
FACESHIELD WRAPAROUND OR TEAM (MASK) ×3 IMPLANT
GAUZE SPONGE 4X4 12PLY STRL (GAUZE/BANDAGES/DRESSINGS) ×2 IMPLANT
GLOVE BIOGEL PI IND STRL 8 (GLOVE) ×2 IMPLANT
GLOVE BIOGEL PI INDICATOR 8 (GLOVE) ×2
GLOVE SURG ORTHO 8.0 STRL STRW (GLOVE) ×2 IMPLANT
GLOVE SURG SS PI 7.5 STRL IVOR (GLOVE) ×2 IMPLANT
GOWN STRL REUS W/TWL XL LVL3 (GOWN DISPOSABLE) ×4 IMPLANT
HEAD CERAMIC DELTA 36 PLUS 1.5 (Hips) ×1 IMPLANT
HOLDER FOLEY CATH W/STRAP (MISCELLANEOUS) ×2 IMPLANT
HOOD PEEL AWAY FLYTE STAYCOOL (MISCELLANEOUS) ×2 IMPLANT
IMMOBILIZER KNEE 20 (SOFTGOODS) ×2
IMMOBILIZER KNEE 20 THIGH 36 (SOFTGOODS) ×1 IMPLANT
KIT BASIN OR (CUSTOM PROCEDURE TRAY) ×2 IMPLANT
KIT TURNOVER KIT A (KITS) IMPLANT
LINER NEUTRAL 52MMX36MMX56 (Hips) ×1 IMPLANT
MANIFOLD NEPTUNE II (INSTRUMENTS) ×2 IMPLANT
NEEDLE HYPO 22GX1.5 SAFETY (NEEDLE) ×4 IMPLANT
NS IRRIG 1000ML POUR BTL (IV SOLUTION) ×2 IMPLANT
PACK TOTAL JOINT (CUSTOM PROCEDURE TRAY) ×2 IMPLANT
PINN SECTOR W/GRIP ACE CUP 56 (Hips) ×2 IMPLANT
PROTECTOR NERVE ULNAR (MISCELLANEOUS) ×2 IMPLANT
STAPLER VISISTAT 35W (STAPLE) ×2 IMPLANT
STEM FEM CMNTLSS LG AML 15.0 (Hips) ×1 IMPLANT
SUCTION FRAZIER HANDLE 12FR (TUBING) ×1
SUCTION TUBE FRAZIER 12FR DISP (TUBING) ×1 IMPLANT
SUT ETHIBOND NAB CT1 #1 30IN (SUTURE) ×8 IMPLANT
SUT VIC AB 0 CT1 36 (SUTURE) ×4 IMPLANT
SUT VIC AB 2-0 CT1 27 (SUTURE) ×2
SUT VIC AB 2-0 CT1 TAPERPNT 27 (SUTURE) ×2 IMPLANT
SYR CONTROL 10ML LL (SYRINGE) ×4 IMPLANT
TAPE STRIPS DRAPE STRL (GAUZE/BANDAGES/DRESSINGS) ×1 IMPLANT
TOWEL OR 17X26 10 PK STRL BLUE (TOWEL DISPOSABLE) ×2 IMPLANT
TRAY FOLEY MTR SLVR 16FR STAT (SET/KITS/TRAYS/PACK) ×2 IMPLANT
WATER STERILE IRR 1000ML POUR (IV SOLUTION) ×4 IMPLANT
YANKAUER SUCT BULB TIP 10FT TU (MISCELLANEOUS) ×2 IMPLANT

## 2018-11-28 NOTE — Anesthesia Procedure Notes (Addendum)
Spinal  Patient location during procedure: OR Start time: 11/28/2018 7:45 AM End time: 11/28/2018 7:51 AM Reason for block: at surgeon's request Staffing Anesthesiologist: Lyn Hollingshead, MD Resident/CRNA: Silas Sacramento, CRNA Performed: resident/CRNA  Preanesthetic Checklist Completed: patient identified, site marked, surgical consent, pre-op evaluation, timeout performed, IV checked, risks and benefits discussed and monitors and equipment checked Spinal Block Patient position: sitting Prep: DuraPrep Patient monitoring: heart rate, cardiac monitor, continuous pulse ox and blood pressure Approach: midline Location: L3-4 Injection technique: single-shot Needle Needle type: Sprotte  Needle gauge: 24 G Needle length: 9 cm Assessment Sensory level: T4 Additional Notes Spinal kit verified to be non-expired. Pt tolerated procedure without incident. + CSF leak, + aspiration of CSF before injection of dose.

## 2018-11-28 NOTE — Anesthesia Postprocedure Evaluation (Signed)
Anesthesia Post Note  Patient: ADEEB KONECNY  Procedure(s) Performed: TOTAL HIP ARTHROPLASTY (Left )     Patient location during evaluation: PACU Anesthesia Type: Spinal Level of consciousness: awake Pain management: pain level controlled Vital Signs Assessment: post-procedure vital signs reviewed and stable Respiratory status: spontaneous breathing Cardiovascular status: stable Postop Assessment: no headache, no backache and spinal receding Anesthetic complications: no    Last Vitals:  Vitals:   11/28/18 1130 11/28/18 1145  BP: 130/79 140/80  Pulse: (!) 53 (!) 52  Resp: 10 10  Temp:  (!) 36.4 C  SpO2: 100% 100%    Last Pain:  Vitals:   11/28/18 1130  TempSrc:   PainSc: 0-No pain   Pain Goal: Patients Stated Pain Goal: 3 (11/28/18 0600)  LLE Motor Response: Purposeful movement (11/28/18 1130) LLE Sensation: Decreased (11/28/18 1130) RLE Motor Response: Purposeful movement (11/28/18 1130) RLE Sensation: Decreased (11/28/18 1130) L Sensory Level: L3-Anterior knee, lower leg (11/28/18 1130) R Sensory Level: L3-Anterior knee, lower leg (11/28/18 1130) Epidural/Spinal Function Cutaneous sensation: Able to Discern Pressure (11/28/18 1130), Patient able to flex knees: Yes (11/28/18 1130)  Huston Foley

## 2018-11-28 NOTE — Op Note (Signed)
NAME: AVYUKT, CIMO MEDICAL RECORD [id number] ACCOUNT [id number] DATE OF BIRTH:08-28-1950 FACILITY: [facility] LOCATION: [facility] PHYSICIAN:W. Sowmya Partridge JR., MD  OPERATIVE REPORT  DATE OF PROCEDURE:  11/28/2018  PREOPERATIVE DIAGNOSIS:  Severe osteoarthritis, left hip.  POSTOPERATIVE DIAGNOSIS:  Severe osteoarthritis, left hip.  OPERATION:  Left total hip replacement (DePuy AML large stature 15 mm stem with 36 mm hip ball +5 mm neck length ceramic hip Biolox, 56 mm Pinnacle Gription cup with AltrX +4 mm 10-degree acetabular liner).  SURGEON:  Vangie Bicker, MD  ASSISTANTMarjo Bicker  ANESTHESIA:  Spinal anesthetic.  ESTIMATED BLOOD LOSS:  400 mL.  DESCRIPTION OF PROCEDURE:  Posterior approach to the hip made with the patient in the lateral position, splitting the iliotibial band and gluteus maximus.  We split the short external rotators and did a capsulotomy in the hip, delivered the hip.  Severe  degenerative change was noted.  Cut the head about 1 fingerbreadth above the lesser trochanter.  We progressively reamed and rasped to accept the 50 mm large stature stem.  Attention was next directed to the acetabulum.  Acetabular wing retractors were placed posteriorly, superiorly.  Acetabulum was cleared of any labral tissue and soft tissue debris.  We medialized the acetabulum, followed by progressive reaming for 1 mm  underreaming to accept a 56 mm cup, which was implanted without difficulty.  Excellent fit of the cuff was obtained.  Trial liner was placed.  Trial off the broach with the use a 1.5 mm neck length.  We then placed all final components after copious  irrigation, acetabular liner followed by the femoral stem and then trialed off the stem, again settling on the 1.5 mm neck length.  Stability was excellent.  The patient could be fully flexed on the table, full adduction and internally rotated even to 90  degrees without any tendency to dislocate.  Leg lengths appeared  equal.  Closure was effected with #1 Ethibond on the capsule, on the iliotibial band and gluteus maximus with #1 Ethibond, 0, 2-0 Vicryl, skin clips on the skin.  A lightly compressive  sterile dressing and knee immobilizer applied.  Taken to recovery room in stable condition.  LN/NUANCE  D:11/28/2018 T:11/28/2018 JOB:[id number]

## 2018-11-28 NOTE — Brief Op Note (Signed)
11/28/2018  10:02 AM  PATIENT:  Benard Rink  68 y.o. male  PRE-OPERATIVE DIAGNOSIS:  OA LEFT HIP  POST-OPERATIVE DIAGNOSIS:  OA LEFT HIP  PROCEDURE:  Procedure(s): TOTAL HIP ARTHROPLASTY (Left)  SURGEON:  Surgeon(s) and Role:    Earlie Server, MD - Primary  PHYSICIAN ASSISTANT: Chriss Czar, PA-C  ASSISTANTS: OR staff x1   ANESTHESIA:   local, spinal and IV sedation  EBL:  450 mL   BLOOD ADMINISTERED:none  DRAINS: none   LOCAL MEDICATIONS USED:  MARCAINE     SPECIMEN:  No Specimen  DISPOSITION OF SPECIMEN:  N/A  COUNTS:  YES  TOURNIQUET:  * No tourniquets in log *  DICTATION: .Other Dictation: Dictation Number unknown  PLAN OF CARE: Admit for overnight observation  PATIENT DISPOSITION:  PACU - hemodynamically stable.   Delay start of Pharmacological VTE agent (>24hrs) due to surgical blood loss or risk of bleeding: yes

## 2018-11-28 NOTE — Discharge Instructions (Signed)

## 2018-11-28 NOTE — Interval H&P Note (Signed)
History and Physical Interval Note:  11/28/2018 7:30 AM  Dennis Huffman  has presented today for surgery, with the diagnosis of OA LEFT HIP.  The various methods of treatment have been discussed with the patient and family. After consideration of risks, benefits and other options for treatment, the patient has consented to  Procedure(s): TOTAL HIP ARTHROPLASTY (Left) as a surgical intervention.  The patient's history has been reviewed, patient examined, no change in status, stable for surgery.  I have reviewed the patient's chart and labs.  Questions were answered to the patient's satisfaction.     [name]

## 2018-11-28 NOTE — Evaluation (Addendum)
Physical Therapy Evaluation Patient Details Name: Dennis Huffman MRN: [id number] DOB: Jan 12, 1951 Today's Date: 11/28/2018   History of Present Illness  s/p L posterior THA. PMHx: ACDF, HTN  Clinical Impression  Pt is s/p THA resulting in the deficits listed below (see PT Problem List).  Pt am b~ 60'+  With RW and min/guard assist. Anticipate pt will continue  To progress well; continue PT  Pt will benefit from skilled PT to increase their independence and safety with mobility to allow discharge to the venue listed below.      Follow Up Recommendations Follow surgeon's recommendation for DC plan and follow-up therapies    Equipment Recommendations  Rolling walker with 5" wheels;3in1 (PT)    Recommendations for Other Services       Precautions / Restrictions Precautions Precautions: Posterior Hip Restrictions Weight Bearing Restrictions: No Other Position/Activity Restrictions: WBAT      Mobility  Bed Mobility Overal bed mobility: Needs Assistance Bed Mobility: Supine to Sit     Supine to sit: Min assist;HOB elevated     General bed mobility comments: assist with LLE  Transfers Overall transfer level: Needs assistance Equipment used: Rolling walker (2 wheeled) Transfers: Sit to/from Stand Sit to Stand: Min guard;From elevated surface         General transfer comment: cues for hand placement, THP--LLE position  Ambulation/Gait Ambulation/Gait assistance: Min guard Gait Distance (Feet): 65 Feet Assistive device: Rolling walker (2 wheeled) Gait Pattern/deviations: Step-to pattern;Step-through pattern;Decreased stride length     General Gait Details: cues for sequence and RW position  Stairs            Wheelchair Mobility    Modified Rankin (Stroke Patients Only)       Balance                                             Pertinent Vitals/Pain Pain Assessment: 0-10 Pain Score: 3  Pain Location: left hip Pain Descriptors /  Indicators: Sore Pain Intervention(s): Monitored during session;Limited activity within patient's tolerance    Home Living Family/patient expects to be discharged to:: Private residence Living Arrangements: Spouse/significant other Available Help at Discharge: Family Type of Home: House Home Access: Level entry     Home Layout: Two level;Able to live on main level with bedroom/bathroom(has a bed downstairs) Home Equipment: Cane - single point      Prior Function Level of Independence: Independent         Comments: amb with cane      Hand Dominance        Extremity/Trunk Assessment   Upper Extremity Assessment Upper Extremity Assessment: Overall WFL for tasks assessed    Lower Extremity Assessment Lower Extremity Assessment: LLE deficits/detail LLE Deficits / Details: ankle WFL, knee and hip grossly 3/5, limited by post op soreness        Communication   Communication: No difficulties  Cognition Arousal/Alertness: Awake/alert Behavior During Therapy: WFL for tasks assessed/performed Overall Cognitive Status: Within Functional Limits for tasks assessed                                        General Comments      Exercises Total Joint Exercises Ankle Circles/Pumps: AROM;Both;10 reps Quad Sets: AROM;Both;5 reps   Assessment/Plan  PT Assessment Patient needs continued PT services  PT Problem List Decreased strength;Decreased mobility;Decreased knowledge of use of DME;Decreased knowledge of precautions       PT Treatment Interventions Gait training;DME instruction;Functional mobility training;Patient/family education;Therapeutic activities;Therapeutic exercise    PT Goals (Current goals can be found in the Care Plan section)  Acute Rehab PT Goals Patient Stated Goal: less pain  PT Goal Formulation: With patient Time For Goal Achievement: 12/05/18 Potential to Achieve Goals: Good    Frequency 7X/week   Barriers to discharge         Co-evaluation               AM-PAC PT "6 Clicks" Mobility  Outcome Measure Help needed turning from your back to your side while in a flat bed without using bedrails?: A Little Help needed moving from lying on your back to sitting on the side of a flat bed without using bedrails?: A Little Help needed moving to and from a bed to a chair (including a wheelchair)?: A Little Help needed standing up from a chair using your arms (e.g., wheelchair or bedside chair)?: A Little Help needed to walk in hospital room?: A Little Help needed climbing 3-5 steps with a railing? : A Little 6 Click Score: 18    End of Session Equipment Utilized During Treatment: Gait belt Activity Tolerance: Patient tolerated treatment well Patient left: in chair;with call bell/phone within reach;with chair alarm set   PT Visit Diagnosis: Difficulty in walking, not elsewhere classified (R26.2)    Time: 9643-8381 PT Time Calculation (min) (ACUTE ONLY): 20 min   Charges:   PT Evaluation $PT Eval Low Complexity: 1 Low          Kenyon Ana, PT  Pager: [phone] Acute Rehab Dept [facility]): [phone]   11/28/2018   [facility] 11/28/2018, 6:06 PM

## 2018-11-28 NOTE — Transfer of Care (Signed)
Immediate Anesthesia Transfer of Care Note  Patient: Dennis Huffman  Procedure(s) Performed: TOTAL HIP ARTHROPLASTY (Left )  Patient Location: PACU  Anesthesia Type:MAC and Spinal  Level of Consciousness: drowsy, patient cooperative and responds to stimulation  Airway & Oxygen Therapy: Patient Spontanous Breathing and Patient connected to face mask oxygen  Post-op Assessment: Report given to RN and Post -op Vital signs reviewed and stable  Post vital signs: Reviewed and stable  Last Vitals:  Vitals Value Taken Time  BP 151/79 11/28/2018 10:09 AM  Temp    Pulse 58 11/28/2018 10:10 AM  Resp 16 11/28/2018 10:12 AM  SpO2 99 % 11/28/2018 10:10 AM  Vitals shown include unvalidated device data.  Last Pain:  Vitals:   11/28/18 0600  TempSrc:   PainSc: 0-No pain      Patients Stated Pain Goal: 3 (64/15/83 0940)  Complications: No apparent anesthesia complications

## 2018-11-29 LAB — CBC
HCT: 30.5 % — ABNORMAL LOW (ref 39.0–52.0)
Hemoglobin: 10.5 g/dL — ABNORMAL LOW (ref 13.0–17.0)
MCH: 27.9 pg (ref 26.0–34.0)
MCHC: 34.4 g/dL (ref 30.0–36.0)
MCV: 81.1 fL (ref 80.0–100.0)
Platelets: 157 10*3/uL (ref 150–400)
RBC: 3.76 MIL/uL — ABNORMAL LOW (ref 4.22–5.81)
RDW: 13.5 % (ref 11.5–15.5)
WBC: 12.7 10*3/uL — ABNORMAL HIGH (ref 4.0–10.5)
nRBC: 0 % (ref 0.0–0.2)

## 2018-11-29 LAB — BASIC METABOLIC PANEL
Anion gap: 9 (ref 5–15)
BUN: 18 mg/dL (ref 8–23)
CO2: 22 mmol/L (ref 22–32)
Calcium: 9.3 mg/dL (ref 8.9–10.3)
Chloride: 104 mmol/L (ref 98–111)
Creatinine, Ser: 1 mg/dL (ref 0.61–1.24)
GFR calc Af Amer: >60 mL/min (ref >60)
GFR calc non Af Amer: >60 mL/min (ref >60)
Glucose, Bld: 144 mg/dL — ABNORMAL HIGH (ref 70–99)
Potassium: 4.4 mmol/L (ref 3.5–5.1)
Sodium: 135 mmol/L (ref 135–145)

## 2018-11-29 NOTE — Discharge Summary (Signed)
SPORTS MEDICINE & JOINT REPLACEMENT   Lara Mulch, MD   Carlyon Shadow, PA-C [address], [place], [place]  [address]                             [phone]  PATIENT ID: Dennis Huffman        MRN:  [id number]          DOB/AGE: 1950/12/03 / 68 y.o.    DISCHARGE SUMMARY  ADMISSION DATE:    11/28/2018 DISCHARGE DATE:   11/29/2018   ADMISSION DIAGNOSIS: OA LEFT HIP    DISCHARGE DIAGNOSIS:  OA LEFT HIP    ADDITIONAL DIAGNOSIS: Active Problems:   Degenerative joint disease of left hip  Past Medical History:  Diagnosis Date  . Arthritis    cervical spondylotic, radiculopathy  . Bradycardia    a. HR previously reported as 40 at PCP's office; event monitor 2017 showed nocturnal bradycardia but no daytime findings.  . Chronic kidney disease, stage III (moderate) ([place])   . Hypercholesteremia   . Hypertension   . Premature atrial contractions   . Prostate cancer ([place])   . PVC's (premature ventricular contractions)     PROCEDURE: Procedure(s): TOTAL HIP ARTHROPLASTY on 11/28/2018  CONSULTS:    HISTORY:  See H&P in chart  HOSPITAL COURSE:  Dennis Huffman is a 68 y.o. admitted on 11/28/2018 and found to have a diagnosis of OA LEFT HIP.  After appropriate laboratory studies were obtained  they were taken to the operating room on 11/28/2018 and underwent Procedure(s): TOTAL HIP ARTHROPLASTY.   They were given perioperative antibiotics:  Anti-infectives (From admission, onward)   Start     Dose/Rate Route Frequency Ordered Stop   11/28/18 1330  ceFAZolin (ANCEF) IVPB 1 g/50 mL premix     1 g 100 mL/hr over 30 Minutes Intravenous Every 6 hours 11/28/18 1217 11/28/18 1955   11/28/18 0600  ceFAZolin (ANCEF) IVPB 2g/100 mL premix     2 g 200 mL/hr over 30 Minutes Intravenous On call to O.R. 11/28/18 0093 11/28/18 8182    .  Patient given tranexamic acid IV or topical and exparel intra-operatively.  Tolerated the procedure well.    POD# 1: Vital signs were stable.  Patient  denied Chest pain, shortness of breath, or calf pain.  Patient was started on Aspirin twice daily at 8am.  Consults to PT, OT, and care management were made.  The patient was weight bearing as tolerated.  CPM was placed on the operative leg 0-90 degrees for 6-8 hours a day. When out of the CPM, patient was placed in the foam block to achieve full extension. Incentive spirometry was taught.  Dressing was changed.       POD #2, Continued  PT for ambulation and exercise program.  IV saline locked.  O2 discontinued.    The remainder of the hospital course was dedicated to ambulation and strengthening.   The patient was discharged on 1 Day Post-Op in  Good condition.  Blood products given:none  DIAGNOSTIC STUDIES: Recent vital signs:  Patient Vitals for the past 24 hrs:  BP Temp Pulse Resp SpO2  11/29/18 1259 (!) 148/78 98.1 F (36.7 C) 76 16 100 %  11/29/18 0455 138/69 98 F (36.7 C) 66 18 100 %  11/29/18 0059 137/74 98.4 F (36.9 C) 66 18 99 %       Recent laboratory studies: Recent Labs    11/29/18 0417  WBC 12.7*  HGB 10.5*  HCT 30.5*  PLT 157   Recent Labs    11/29/18 0417  NA 135  K 4.4  CL 104  CO2 22  BUN 18  CREATININE 1.00  GLUCOSE 144*  CALCIUM 9.3   No results found for: INR, PROTIME   Recent Radiographic Studies :  Dg Pelvis Portable  Result Date: 11/28/2018 CLINICAL DATA:  Status post left hip replacement. EXAM: PORTABLE PELVIS 1-2 VIEWS COMPARISON:  CT scan of April 29, 2017. FINDINGS: Status post left total hip arthroplasty. Expected postoperative changes seen in the surrounding soft tissues. The femoral and acetabular components appear to be well situated. Moderate degenerative changes seen involving the right hip. No fracture or dislocation is noted. IMPRESSION: Status post left total hip arthroplasty. Electronically Signed   By: Marijo Conception M.D.   On: 11/28/2018 11:09   Dg Hip Port Unilat With Pelvis 1v Left  Result Date: 11/28/2018 CLINICAL DATA:   Postop left hip replaced EXAM: DG HIP (WITH OR WITHOUT PELVIS) 1V PORT LEFT COMPARISON:  None. FINDINGS: Interval left total hip arthroplasty without failure or complication. No fracture or dislocation. Normal alignment. IMPRESSION: Interval left total hip arthroplasty. Electronically Signed   By: Kathreen Devoid   On: 11/28/2018 11:08    DISCHARGE INSTRUCTIONS: Discharge Instructions    Call MD / Call 911   Complete by:  As directed    If you experience chest pain or shortness of breath, CALL 911 and be transported to the hospital emergency room.  If you develope a fever above 101 F, pus (white drainage) or increased drainage or redness at the wound, or calf pain, call your surgeon's office.   Constipation Prevention   Complete by:  As directed    Drink plenty of fluids.  Prune juice may be helpful.  You may use a stool softener, such as Colace (over the counter) 100 mg twice a day.  Use MiraLax (over the counter) for constipation as needed.   Diet - low sodium heart healthy   Complete by:  As directed    Discharge instructions   Complete by:  As directed    INSTRUCTIONS AFTER JOINT REPLACEMENT   Remove items at home which could result in a fall. This includes throw rugs or furniture in walking pathways ICE to the affected joint every three hours while awake for 30 minutes at a time, for at least the first 3-5 days, and then as needed for pain and swelling.  Continue to use ice for pain and swelling. You may notice swelling that will progress down to the foot and ankle.  This is normal after surgery.  Elevate your leg when you are not up walking on it.   Continue to use the breathing machine you got in the hospital (incentive spirometer) which will help keep your temperature down.  It is common for your temperature to cycle up and down following surgery, especially at night when you are not up moving around and exerting yourself.  The breathing machine keeps your lungs expanded and your temperature  down.   DIET:  As you were doing prior to hospitalization, we recommend a well-balanced diet.  DRESSING / WOUND CARE / SHOWERING  You may change your dressing 3-5 days after surgery.  Then change the dressing every day with sterile gauze.  Please use good hand washing techniques before changing the dressing.  Do not use any lotions or creams on the incision until instructed by your surgeon.  ACTIVITY  Increase activity slowly as tolerated, but follow the weight bearing instructions below.   No driving for 6 weeks or until further direction given by your physician.  You cannot drive while taking narcotics.  No lifting or carrying greater than 10 lbs. until further directed by your surgeon. Avoid periods of inactivity such as sitting longer than an hour when not asleep. This helps prevent blood clots.  You may return to work once you are authorized by your doctor.     WEIGHT BEARING   Weight bearing as tolerated with assist device (walker, cane, etc) as directed, use it as long as suggested by your surgeon or therapist, typically at least 4-6 weeks.   EXERCISES  Results after joint replacement surgery are often greatly improved when you follow the exercise, range of motion and muscle strengthening exercises prescribed by your doctor. Safety measures are also important to protect the joint from further injury. Any time any of these exercises cause you to have increased pain or swelling, decrease what you are doing until you are comfortable again and then slowly increase them. If you have problems or questions, call your caregiver or physical therapist for advice.   Rehabilitation is important following a joint replacement. After just a few days of immobilization, the muscles of the leg can become weakened and shrink (atrophy).  These exercises are designed to build up the tone and strength of the thigh and leg muscles and to improve motion. Often times heat used for twenty to thirty minutes  before working out will loosen up your tissues and help with improving the range of motion but do not use heat for the first two weeks following surgery (sometimes heat can increase post-operative swelling).   These exercises can be done on a training (exercise) mat, on the floor, on a table or on a bed. Use whatever works the best and is most comfortable for you.    Use music or television while you are exercising so that the exercises are a pleasant break in your day. This will make your life better with the exercises acting as a break in your routine that you can look forward to.   Perform all exercises about fifteen times, three times per day or as directed.  You should exercise both the operative leg and the other leg as well.   Exercises include:   Quad Sets - Tighten up the muscle on the front of the thigh (Quad) and hold for 5-10 seconds.   Straight Leg Raises - With your knee straight (if you were given a brace, keep it on), lift the leg to 60 degrees, hold for 3 seconds, and slowly lower the leg.  Perform this exercise against resistance later as your leg gets stronger.  Leg Slides: Lying on your back, slowly slide your foot toward your buttocks, bending your knee up off the floor (only go as far as is comfortable). Then slowly slide your foot back down until your leg is flat on the floor again.  Angel Wings: Lying on your back spread your legs to the side as far apart as you can without causing discomfort.  Hamstring Strength:  Lying on your back, push your heel against the floor with your leg straight by tightening up the muscles of your buttocks.  Repeat, but this time bend your knee to a comfortable angle, and push your heel against the floor.  You may put a pillow under the heel to make it more comfortable if necessary.  A rehabilitation program following joint replacement surgery can speed recovery and prevent re-injury in the future due to weakened muscles. Contact your doctor or a  physical therapist for more information on knee rehabilitation.    CONSTIPATION  Constipation is defined medically as fewer than three stools per week and severe constipation as less than one stool per week.  Even if you have a regular bowel pattern at home, your normal regimen is likely to be disrupted due to multiple reasons following surgery.  Combination of anesthesia, postoperative narcotics, change in appetite and fluid intake all can affect your bowels.   YOU MUST use at least one of the following options; they are listed in order of increasing strength to get the job done.  They are all available over the counter, and you may need to use some, POSSIBLY even all of these options:    Drink plenty of fluids (prune juice may be helpful) and high fiber foods Colace 100 mg by mouth twice a day  Senokot for constipation as directed and as needed Dulcolax (bisacodyl), take with full glass of water  Miralax (polyethylene glycol) once or twice a day as needed.  If you have tried all these things and are unable to have a bowel movement in the first 3-4 days after surgery call either your surgeon or your primary doctor.    If you experience loose stools or diarrhea, hold the medications until you stool forms back up.  If your symptoms do not get better within 1 week or if they get worse, check with your doctor.  If you experience "the worst abdominal pain ever" or develop nausea or vomiting, please contact the office immediately for further recommendations for treatment.   ITCHING:  If you experience itching with your medications, try taking only a single pain pill, or even half a pain pill at a time.  You can also use Benadryl over the counter for itching or also to help with sleep.   TED HOSE STOCKINGS:  Use stockings on both legs until for at least 2 weeks or as directed by physician office. They may be removed at night for sleeping.  MEDICATIONS:  See your medication summary on the "After  Visit Summary" that nursing will review with you.  You may have some home medications which will be placed on hold until you complete the course of blood thinner medication.  It is important for you to complete the blood thinner medication as prescribed.  PRECAUTIONS:  If you experience chest pain or shortness of breath - call 911 immediately for transfer to the hospital emergency department.   If you develop a fever greater that 101 F, purulent drainage from wound, increased redness or drainage from wound, foul odor from the wound/dressing, or calf pain - CONTACT YOUR SURGEON.                                                   FOLLOW-UP APPOINTMENTS:  If you do not already have a post-op appointment, please call the office for an appointment to be seen by your surgeon.  Guidelines for how soon to be seen are listed in your "After Visit Summary", but are typically between 1-4 weeks after surgery.  OTHER INSTRUCTIONS:   Knee Replacement:  Do not place pillow under knee, focus on keeping the knee straight while resting.  CPM instructions: 0-90 degrees, 2 hours in the morning, 2 hours in the afternoon, and 2 hours in the evening. Place foam block, curve side up under heel at all times except when in CPM or when walking.  DO NOT modify, tear, cut, or change the foam block in any way.  MAKE SURE YOU:  Understand these instructions.  Get help right away if you are not doing well or get worse.    Thank you for letting us be a part of your medical care team.  It is a privilege we respect greatly.  We hope these instructions will help you stay on track for a fast and full recovery!   Increase activity slowly as tolerated   Complete by:  As directed       DISCHARGE MEDICATIONS:   Allergies as of 11/29/2018      Reactions   Hydrocodone Palpitations      Medication List    STOP taking these medications   traMADol 50 MG tablet Commonly known as:  ULTRAM     TAKE these medications   acetaminophen  650 MG CR tablet Commonly known as:  TYLENOL Take 650-1,300 mg by mouth every 8 (eight) hours as needed for pain.   amLODipine 5 MG tablet Commonly known as:  NORVASC Take 5 mg by mouth every evening.   aspirin EC 81 MG tablet Take 1 tablet (81 mg total) by mouth 2 (two) times daily for 30 days. TO PREVENT BLOOD CLOTS   atorvastatin 10 MG tablet Commonly known as:  LIPITOR Take 10 mg by mouth daily.   bismuth subsalicylate 277 OE/42PN suspension Commonly known as:  PEPTO BISMOL Take 30 mLs by mouth every 6 (six) hours as needed for indigestion.   FERROUS SULFATE PO Take 1 tablet by mouth daily.   Glucosamine-Chondroitin-MSM 500-400-167 MG Tabs Take 3 tablets by mouth daily.   Leg Cramp Relief Tabs Take 1-2 tablets by mouth daily as needed (leg cramps).   losartan 100 MG tablet Commonly known as:  COZAAR Take 100 mg by mouth daily.   magnesium oxide 400 MG tablet Commonly known as:  MAG-OX Take 400 mg by mouth daily.   Melatonin 10 MG Caps Take 10-20 mg by mouth at bedtime.   multivitamin with minerals Tabs tablet Take 2 tablets by mouth daily.   OVER THE COUNTER MEDICATION Take 1 tablet by mouth daily. Kidney Factors otc supplement   oxyCODONE 5 MG immediate release tablet Commonly known as:  Oxy IR/ROXICODONE Take one tab po q4-6hrs prn pain, may need 1-2 first couple weeks   Probiotic Caps Take 1 capsule by mouth daily.   vitamin B-12 1000 MCG tablet Commonly known as:  CYANOCOBALAMIN Take 1,000 mcg by mouth daily.       FOLLOW UP VISIT:   Follow-up Information    Earlie Server, MD. Schedule an appointment as soon as possible for a visit in 2 weeks.   Specialty:  Orthopedic Surgery Contact information: [address] [place] [address] [phone]        [facility] Follow up.   Specialty:  [place] Why:  Home Health Physical Therapy, agnecy will call to schedule initial visit Contact information: [place]  [phone]           DISPOSITION: HOME VS. SNF  CONDITION:  Good   Donia Ast 11/29/2018, 9:04 PM

## 2018-11-29 NOTE — Progress Notes (Signed)
DC instructions provided and explained in detail to patient. Patient verbalizes understanding. Escorted to lobby in wheel chair to be discharged with daughter

## 2018-11-29 NOTE — TOC Initial Note (Signed)
Transition of Care [facility]) - Initial/Assessment Note    Patient Details  Name: Dennis Huffman MRN: [id number] Date of Birth: 1951-01-30  [facility]) CM/SW Contact:    Joaquin Courts, RN Phone Number: 11/29/2018, 11:12 AM  Clinical Narrative:  CM spoke to patient at bedside, patient set-up with [facility] for HHPT. [employer] contacted for DME RW and 3-in-1. No further needs identified.                  Expected Discharge Plan: [place] Barriers to Discharge: No Barriers Identified   Patient Goals and CMS Choice Patient states their goals for this hospitalization and ongoing recovery are:: to go home CMS Medicare.gov Compare Post Acute Care list provided to:: Patient Choice offered to / list presented to : Patient  Expected Discharge Plan and Services Expected Discharge Plan: [place]   Discharge Planning Services: CM Consult Post Acute Care Choice: Durable Medical Equipment, Home Health Living arrangements for the past 2 months: Single Family Home Expected Discharge Date: 11/29/18               DME Arranged: 3-N-1, Gilford Rile rolling DME Agency: [employer] Date DME Agency Contacted: 11/29/18 Time DME Agency Contacted: 1110 Representative spoke with at DME Agency: [place]: PT [place]: [facility] [employer]) Date [place]: 11/29/18 Time [place]: 1112 Representative spoke with at [place]: Graciella Freer  Prior Living Arrangements/Services Living arrangements for the past 2 months: [place] with:: Spouse Patient language and need for interpreter reviewed:: Yes Do you feel safe going back to the place where you live?: Yes      Need for Family Participation in Patient Care: Yes (Comment) Care giver support system in place?: Yes (comment)   Criminal Activity/Legal Involvement Pertinent to Current Situation/Hospitalization: No - Comment as needed  Activities of  Daily Living Home Assistive Devices/Equipment: Dentures (specify type), Eyeglasses, Cane (specify quad or straight) ADL Screening (condition at time of admission) Patient's cognitive ability adequate to safely complete daily activities?: Yes Is the patient deaf or have difficulty hearing?: No Does the patient have difficulty seeing, even when wearing glasses/contacts?: No Does the patient have difficulty concentrating, remembering, or making decisions?: No Patient able to express need for assistance with ADLs?: Yes Does the patient have difficulty dressing or bathing?: No Independently performs ADLs?: Yes (appropriate for developmental age) Does the patient have difficulty walking or climbing stairs?: No Weakness of Legs: None Weakness of Arms/Hands: None  Permission Sought/Granted                  Emotional Assessment Appearance:: Appears stated age Attitude/Demeanor/Rapport: Engaged Affect (typically observed): Accepting Orientation: : Oriented to Self, Oriented to Place, Oriented to Situation, Oriented to  Time   Psych Involvement: No (comment)  Admission diagnosis:  OA LEFT HIP Patient Active Problem List   Diagnosis Date Noted  . Degenerative joint disease of left hip 11/28/2018  . Prostate cancer ([place]) 08/29/2016  . Neck pain 05/12/2015  . Bradycardia 04/21/2014  . Hyperlipidemia 04/21/2014   PCP:  [name], MD Pharmacy:   [id number] - [place], [place] [address] [place] [address] Phone: [phone] Fax: [phone]     Social Determinants of Health (SDOH) Interventions    Readmission Risk Interventions No flowsheet data found.

## 2018-11-29 NOTE — Progress Notes (Signed)

## 2018-11-29 NOTE — Progress Notes (Signed)
SPORTS MEDICINE AND JOINT REPLACEMENT  Lara Mulch, MD    Carlyon Shadow, PA-C [place], [place],   [address]                             [phone]   PROGRESS NOTE  Subjective:  negative for Chest Pain  negative for Shortness of Breath  negative for Nausea/Vomiting   negative for Calf Pain  negative for Bowel Movement   Tolerating Diet: yes         Patient reports pain as 3 on 0-10 scale.    Objective: Vital signs in last 24 hours:    Patient Vitals for the past 24 hrs:  BP Temp Temp src Pulse Resp SpO2 Height Weight  11/29/18 0455 138/69 98 F (36.7 C) - 66 18 100 % - -  11/29/18 0059 137/74 98.4 F (36.9 C) - 66 18 99 % - -  11/28/18 2044 108/68 (!) 97.4 F (36.3 C) - 64 18 100 % - -  11/28/18 1500 - - - - - - 5\' 11"  (1.803 m) 103.9 kg  11/28/18 1441 (!) 164/85 98.5 F (36.9 C) Oral 71 18 100 % - -  11/28/18 1325 (!) 165/85 98.2 F (36.8 C) Oral 66 18 100 % - -  11/28/18 1211 133/74 (!) 97.5 F (36.4 C) Oral (!) 55 14 100 % - -  11/28/18 1145 140/80 (!) 97.5 F (36.4 C) - (!) 52 10 100 % - -  11/28/18 1130 130/79 - - (!) 53 10 100 % - -  11/28/18 1115 140/82 - - (!) 49 18 97 % - -  11/28/18 1100 140/80 - - (!) 51 14 100 % - -  11/28/18 1045 (!) 133/96 - - - 15 - - -  11/28/18 1030 (!) 148/83 - - (!) 58 16 100 % - -  11/28/18 1015 (!) 143/77 - - 67 (!) 22 100 % - -  11/28/18 1009 (!) 151/79 (!) 97.4 F (36.3 C) - 64 15 96 % - -    @flow {1959:LAST@   Intake/Output from previous day:   06/05 0701 - 06/06 0700 In: 4142.2 [P.O.:600; I.V.:3042.2] Out: 3605 [Urine:3155]   Intake/Output this shift:   06/05 1901 - 06/06 0700 In: 1567.4 [P.O.:600; I.V.:967.4] Out: 1675 [Urine:1675]   Intake/Output      06/05 0701 - 06/06 0700   P.O. 600   I.V. (mL/kg) 3042.2 (29.3)   IV Piggyback 500   Total Intake(mL/kg) 4142.2 (39.9)   Urine (mL/kg/hr) 3155 (1.3)   Stool 0   Blood 450   Total Output 3605   Net +537.2       Stool Occurrence 0 x      LABORATORY DATA: Recent Labs    11/29/18 0417  WBC 12.7*  HGB 10.5*  HCT 30.5*  PLT 157   Recent Labs    11/29/18 0417  NA 135  K 4.4  CL 104  CO2 22  BUN 18  CREATININE 1.00  GLUCOSE 144*  CALCIUM 9.3   No results found for: INR, PROTIME  Examination:  General appearance: alert, cooperative and no distress Extremities: extremities normal, atraumatic, no cyanosis or edema  Wound Exam: clean, dry, intact   Drainage:  None: wound tissue dry  Motor Exam: Quadriceps and Hamstrings Intact  Sensory Exam: Superficial Peroneal, Deep Peroneal and Tibial normal   Assessment:    1 Day Post-Op  Procedure(s) (LRB): TOTAL HIP ARTHROPLASTY (  Left)  ADDITIONAL DIAGNOSIS:  Active Problems:   Degenerative joint disease of left hip     Plan: Physical Therapy as ordered Weight Bearing as Tolerated (WBAT)  DVT Prophylaxis:  TED hose  DISCHARGE PLAN: Home  Patient doing well, expect D/C home today once cleared by PT Donia Ast 11/29/2018, 6:28 AM

## 2018-11-29 NOTE — Progress Notes (Signed)
Physical Therapy Treatment Patient Details Name: Dennis Huffman MRN: [id number] DOB: 12/23/1950 Today's Date: 11/29/2018    History of Present Illness s/p L posterior THA. PMHx: ACDF, HTN    PT Comments    Mr. Bera progressed mobility in therapy today and tolerated increase in ambulation distance with no increase in pain. He required supervision for sit<>stand transfers with cues to extend Lt LE out to prevent excessive hip flexion and maintain precautions in seated position. He demonstrated improved balance in standing for toileting and hand hygiene. Patient continues to ambulate with reduced weight shift to Lt and decreased stance time on Lt LE. He was instructed on reverse step up technique to enter home if there is a landing at the doorway. He required supervision and demonstrated stable stance when lifting RW up to landing. Patient was left in bedside recliner at EOS with ice packs and exercises were reviewed for HEP. Mr. Canterbury demonstrated safe mobility today with gait and step up using RW and he is safe to discharge home. Anticipate discharge when medically ready and pain managed adequately.    Follow Up Recommendations  Follow surgeon's recommendation for DC plan and follow-up therapies     Equipment Recommendations  Rolling walker with 5" wheels;3in1 (PT)    Recommendations for Other Services       Precautions / Restrictions Precautions Precautions: Posterior Hip Restrictions Weight Bearing Restrictions: No LLE Weight Bearing: Weight bearing as tolerated Other Position/Activity Restrictions: WBAT    Mobility  Bed Mobility Overal bed mobility: Needs Assistance Bed Mobility: Supine to Sit     Supine to sit: HOB elevated;Supervision     General bed mobility comments: pt required cues to avoid Lt hip adduction but managed Lt LE independently  Transfers Overall transfer level: Needs assistance Equipment used: Rolling walker (2 wheeled) Transfers: Sit to/from  Stand Sit to Stand: From elevated surface;Supervision         General transfer comment: good carryover for hand placement on RW and to maintain Lt LE in extended position with going to sit to prevent excessive flexion  Ambulation/Gait Ambulation/Gait assistance: Supervision Gait Distance (Feet): 100 Feet Assistive device: Rolling walker (2 wheeled) Gait Pattern/deviations: Step-to pattern;Step-through pattern;Decreased step length - left;Decreased stance time - left;Decreased stride length Gait velocity: decreased   General Gait Details: pt with good carryover for RW management and positioning, pt with supervision for gait with RW   Stairs Stairs: Yes Stairs assistance: Supervision Stair Management: No rails;Backwards;With walker(RW) Number of Stairs: 1 General stair comments: reverse step up wtih RW, cues to step up with Rt down with Lt. Pt demonstrated good balance to lift RW up to platform to simulate entrance at home as he has a threshold to enter.    Wheelchair Mobility    Modified Rankin (Stroke Patients Only)       Balance Overall balance assessment: Needs assistance Sitting-balance support: No upper extremity supported;Feet supported Sitting balance-Leahy Scale: Good     Standing balance support: Bilateral upper extremity supported;During functional activity Standing balance-Leahy Scale: Good Standing balance comment: pt maintained good balance durign toileting to stand and void bladder and maintained balance while lifting RW up to platform during reverse step up        Cognition Arousal/Alertness: Awake/alert Behavior During Therapy: WFL for tasks assessed/performed Overall Cognitive Status: Within Functional Limits for tasks assessed         Exercises Total Joint Exercises Ankle Circles/Pumps: AROM;Both;10 reps Heel Slides: AROM;Left;10 reps;Supine Long Arc Quad: AROM;Left;Seated;5 reps  General Comments        Pertinent Vitals/Pain Pain  Assessment: 0-10 Pain Score: 2  Pain Location: left hip Pain Descriptors / Indicators: Sore Pain Intervention(s): Limited activity within patient's tolerance;Monitored during session           PT Goals (current goals can now be found in the care plan section) Acute Rehab PT Goals Patient Stated Goal: less pain  PT Goal Formulation: With patient Time For Goal Achievement: 12/05/18 Potential to Achieve Goals: Good Progress towards PT goals: Progressing toward goals    Frequency    7X/week      PT Plan Current plan remains appropriate       AM-PAC PT "6 Clicks" Mobility   Outcome Measure  Help needed turning from your back to your side while in a flat bed without using bedrails?: A Little Help needed moving from lying on your back to sitting on the side of a flat bed without using bedrails?: A Little Help needed moving to and from a bed to a chair (including a wheelchair)?: A Little Help needed standing up from a chair using your arms (e.g., wheelchair or bedside chair)?: A Little Help needed to walk in hospital room?: A Little Help needed climbing 3-5 steps with a railing? : A Little 6 Click Score: 18    End of Session Equipment Utilized During Treatment: Gait belt Activity Tolerance: Patient tolerated treatment well Patient left: in chair;with call bell/phone within reach;with chair alarm set Nurse Communication: Mobility status PT Visit Diagnosis: Difficulty in walking, not elsewhere classified (R26.2)     Time: 9323-5573 PT Time Calculation (min) (ACUTE ONLY): 31 min  Charges:  $Gait Training: 8-22 mins $Therapeutic Activity: 8-22 mins                     Kipp Brood, PT, DPT, [facility] Physical Therapist with [facility]  11/29/2018 1:18 PM

## 2018-12-01 ENCOUNTER — Encounter (HOSPITAL_COMMUNITY): Payer: Self-pay | Admitting: Orthopedic Surgery

## 2018-12-02 DIAGNOSIS — Z471 Aftercare following joint replacement surgery: Secondary | ICD-10-CM | POA: Diagnosis not present

## 2018-12-02 DIAGNOSIS — I491 Atrial premature depolarization: Secondary | ICD-10-CM | POA: Diagnosis not present

## 2018-12-02 DIAGNOSIS — Z8546 Personal history of malignant neoplasm of prostate: Secondary | ICD-10-CM | POA: Diagnosis not present

## 2018-12-02 DIAGNOSIS — N183 Chronic kidney disease, stage 3 (moderate): Secondary | ICD-10-CM | POA: Diagnosis not present

## 2018-12-02 DIAGNOSIS — Z96642 Presence of left artificial hip joint: Secondary | ICD-10-CM | POA: Diagnosis not present

## 2018-12-02 DIAGNOSIS — E785 Hyperlipidemia, unspecified: Secondary | ICD-10-CM | POA: Diagnosis not present

## 2018-12-02 DIAGNOSIS — M4722 Other spondylosis with radiculopathy, cervical region: Secondary | ICD-10-CM | POA: Diagnosis not present

## 2018-12-02 DIAGNOSIS — Z87891 Personal history of nicotine dependence: Secondary | ICD-10-CM | POA: Diagnosis not present

## 2018-12-02 DIAGNOSIS — I129 Hypertensive chronic kidney disease with stage 1 through stage 4 chronic kidney disease, or unspecified chronic kidney disease: Secondary | ICD-10-CM | POA: Diagnosis not present

## 2018-12-05 DIAGNOSIS — Z8546 Personal history of malignant neoplasm of prostate: Secondary | ICD-10-CM | POA: Diagnosis not present

## 2018-12-05 DIAGNOSIS — N183 Chronic kidney disease, stage 3 (moderate): Secondary | ICD-10-CM | POA: Diagnosis not present

## 2018-12-05 DIAGNOSIS — I491 Atrial premature depolarization: Secondary | ICD-10-CM | POA: Diagnosis not present

## 2018-12-05 DIAGNOSIS — E785 Hyperlipidemia, unspecified: Secondary | ICD-10-CM | POA: Diagnosis not present

## 2018-12-05 DIAGNOSIS — Z87891 Personal history of nicotine dependence: Secondary | ICD-10-CM | POA: Diagnosis not present

## 2018-12-05 DIAGNOSIS — I129 Hypertensive chronic kidney disease with stage 1 through stage 4 chronic kidney disease, or unspecified chronic kidney disease: Secondary | ICD-10-CM | POA: Diagnosis not present

## 2018-12-05 DIAGNOSIS — Z471 Aftercare following joint replacement surgery: Secondary | ICD-10-CM | POA: Diagnosis not present

## 2018-12-05 DIAGNOSIS — M4722 Other spondylosis with radiculopathy, cervical region: Secondary | ICD-10-CM | POA: Diagnosis not present

## 2018-12-05 DIAGNOSIS — Z96642 Presence of left artificial hip joint: Secondary | ICD-10-CM | POA: Diagnosis not present

## 2018-12-08 DIAGNOSIS — Z87891 Personal history of nicotine dependence: Secondary | ICD-10-CM | POA: Diagnosis not present

## 2018-12-08 DIAGNOSIS — M4722 Other spondylosis with radiculopathy, cervical region: Secondary | ICD-10-CM | POA: Diagnosis not present

## 2018-12-08 DIAGNOSIS — E785 Hyperlipidemia, unspecified: Secondary | ICD-10-CM | POA: Diagnosis not present

## 2018-12-08 DIAGNOSIS — Z8546 Personal history of malignant neoplasm of prostate: Secondary | ICD-10-CM | POA: Diagnosis not present

## 2018-12-08 DIAGNOSIS — N183 Chronic kidney disease, stage 3 (moderate): Secondary | ICD-10-CM | POA: Diagnosis not present

## 2018-12-08 DIAGNOSIS — Z96642 Presence of left artificial hip joint: Secondary | ICD-10-CM | POA: Diagnosis not present

## 2018-12-08 DIAGNOSIS — I491 Atrial premature depolarization: Secondary | ICD-10-CM | POA: Diagnosis not present

## 2018-12-08 DIAGNOSIS — I129 Hypertensive chronic kidney disease with stage 1 through stage 4 chronic kidney disease, or unspecified chronic kidney disease: Secondary | ICD-10-CM | POA: Diagnosis not present

## 2018-12-08 DIAGNOSIS — Z471 Aftercare following joint replacement surgery: Secondary | ICD-10-CM | POA: Diagnosis not present

## 2018-12-10 DIAGNOSIS — I129 Hypertensive chronic kidney disease with stage 1 through stage 4 chronic kidney disease, or unspecified chronic kidney disease: Secondary | ICD-10-CM | POA: Diagnosis not present

## 2018-12-10 DIAGNOSIS — E785 Hyperlipidemia, unspecified: Secondary | ICD-10-CM | POA: Diagnosis not present

## 2018-12-10 DIAGNOSIS — I491 Atrial premature depolarization: Secondary | ICD-10-CM | POA: Diagnosis not present

## 2018-12-10 DIAGNOSIS — Z87891 Personal history of nicotine dependence: Secondary | ICD-10-CM | POA: Diagnosis not present

## 2018-12-10 DIAGNOSIS — Z471 Aftercare following joint replacement surgery: Secondary | ICD-10-CM | POA: Diagnosis not present

## 2018-12-10 DIAGNOSIS — N183 Chronic kidney disease, stage 3 (moderate): Secondary | ICD-10-CM | POA: Diagnosis not present

## 2018-12-10 DIAGNOSIS — Z96642 Presence of left artificial hip joint: Secondary | ICD-10-CM | POA: Diagnosis not present

## 2018-12-10 DIAGNOSIS — Z8546 Personal history of malignant neoplasm of prostate: Secondary | ICD-10-CM | POA: Diagnosis not present

## 2018-12-10 DIAGNOSIS — M4722 Other spondylosis with radiculopathy, cervical region: Secondary | ICD-10-CM | POA: Diagnosis not present

## 2018-12-11 DIAGNOSIS — M1612 Unilateral primary osteoarthritis, left hip: Secondary | ICD-10-CM | POA: Diagnosis not present

## 2018-12-12 DIAGNOSIS — Z87891 Personal history of nicotine dependence: Secondary | ICD-10-CM | POA: Diagnosis not present

## 2018-12-12 DIAGNOSIS — I491 Atrial premature depolarization: Secondary | ICD-10-CM | POA: Diagnosis not present

## 2018-12-12 DIAGNOSIS — R208 Other disturbances of skin sensation: Secondary | ICD-10-CM | POA: Diagnosis not present

## 2018-12-12 DIAGNOSIS — Z96642 Presence of left artificial hip joint: Secondary | ICD-10-CM | POA: Diagnosis not present

## 2018-12-12 DIAGNOSIS — M4722 Other spondylosis with radiculopathy, cervical region: Secondary | ICD-10-CM | POA: Diagnosis not present

## 2018-12-12 DIAGNOSIS — N183 Chronic kidney disease, stage 3 (moderate): Secondary | ICD-10-CM | POA: Diagnosis not present

## 2018-12-12 DIAGNOSIS — Z8546 Personal history of malignant neoplasm of prostate: Secondary | ICD-10-CM | POA: Diagnosis not present

## 2018-12-12 DIAGNOSIS — Z471 Aftercare following joint replacement surgery: Secondary | ICD-10-CM | POA: Diagnosis not present

## 2018-12-12 DIAGNOSIS — I129 Hypertensive chronic kidney disease with stage 1 through stage 4 chronic kidney disease, or unspecified chronic kidney disease: Secondary | ICD-10-CM | POA: Diagnosis not present

## 2018-12-12 DIAGNOSIS — E785 Hyperlipidemia, unspecified: Secondary | ICD-10-CM | POA: Diagnosis not present

## 2018-12-17 ENCOUNTER — Ambulatory Visit (INDEPENDENT_AMBULATORY_CARE_PROVIDER_SITE_OTHER): Payer: Medicare HMO

## 2018-12-17 ENCOUNTER — Other Ambulatory Visit: Payer: Self-pay

## 2018-12-17 ENCOUNTER — Other Ambulatory Visit: Payer: Self-pay | Admitting: Podiatry

## 2018-12-17 ENCOUNTER — Encounter: Payer: Self-pay | Admitting: Podiatry

## 2018-12-17 ENCOUNTER — Ambulatory Visit (INDEPENDENT_AMBULATORY_CARE_PROVIDER_SITE_OTHER): Payer: Medicare HMO | Admitting: Podiatry

## 2018-12-17 VITALS — Temp 98.3°F

## 2018-12-17 DIAGNOSIS — M722 Plantar fascial fibromatosis: Secondary | ICD-10-CM | POA: Diagnosis not present

## 2018-12-17 DIAGNOSIS — M79672 Pain in left foot: Secondary | ICD-10-CM

## 2018-12-17 MED ORDER — MELOXICAM 15 MG PO TABS: 15.0000 mg | ORAL_TABLET | Freq: Every day | ORAL | 1 refills | Status: DC

## 2018-12-19 ENCOUNTER — Other Ambulatory Visit: Payer: Self-pay

## 2018-12-19 ENCOUNTER — Ambulatory Visit (HOSPITAL_COMMUNITY)
Admission: EM | Admit: 2018-12-19 | Discharge: 2018-12-19 | Disposition: A | Discharge status: Home / Self Care | Payer: Medicare HMO | Attending: Internal Medicine | Admitting: Internal Medicine

## 2018-12-19 ENCOUNTER — Encounter (HOSPITAL_COMMUNITY): Payer: Self-pay

## 2018-12-19 DIAGNOSIS — H6123 Impacted cerumen, bilateral: Secondary | ICD-10-CM

## 2018-12-19 MED ORDER — CARBAMIDE PEROXIDE 6.5 % OT SOLN: OTIC | 0 refills | Status: DC

## 2018-12-19 NOTE — ED Provider Notes (Signed)
[place]    CSN: [id number] Arrival date & time: 12/19/18  1156      History   Chief Complaint Chief Complaint  Patient presents with  . Cerumen Impaction    HPI Dennis Huffman is a 68 y.o. male comes to urgent care with complaints of increasing earwax and needing he is to be cleaned out.  Patient denies any loss of hearing.  He denies any pain in the ears.  No ear discharge.  No fever or chills.  No dizziness or vertigo.  HPI  Past Medical History:  Diagnosis Date  . Arthritis    cervical spondylotic, radiculopathy  . Bradycardia    a. HR previously reported as 40 at PCP's office; event monitor 2017 showed nocturnal bradycardia but no daytime findings.  . Chronic kidney disease, stage III (moderate) ([place])   . Hypercholesteremia   . Hypertension   . Premature atrial contractions   . Prostate cancer ([place])   . PVC's (premature ventricular contractions)     Patient Active Problem List   Diagnosis Date Noted  . Degenerative joint disease of left hip 11/28/2018  . Prostate cancer ([place]) 08/29/2016  . Neck pain 05/12/2015  . Bradycardia 04/21/2014  . Hyperlipidemia 04/21/2014    Past Surgical History:  Procedure Laterality Date  . ANTERIOR CERVICAL DECOMP/DISCECTOMY FUSION N/A 05/12/2015   Procedure: ANTERIOR CERVICAL DECOMPRESSION/DISCECTOMY FUSION C3 - C6 3 LEVELS;  Surgeon: Melina Schools, MD;  Location: [facility];  Service: Orthopedics;  Laterality: N/A;  . colonscopy    . COLONSCOPY     . INSERTION PROSTATE RADIATION SEED  UNSURE OF DATE   DONE BY DR Karsten Ro   . PROSTATE BIOPSY    . ROTATOR CUFF REPAIR Right 03-13-12; 09-27-15  . TOTAL HIP ARTHROPLASTY Left 11/28/2018   Procedure: TOTAL HIP ARTHROPLASTY;  Surgeon: Earlie Server, MD;  Location: [facility];  Service: Orthopedics;  Laterality: Left;       Home Medications    Prior to Admission medications   Medication Sig Start Date End Date Taking? Authorizing Provider  oxyCODONE (OXY IR/ROXICODONE) 5  MG immediate release tablet Take one tab po q4-6hrs prn pain, may need 1-2 first couple weeks 11/28/18  Yes Chadwell, Vonna Kotyk, PA-C  acetaminophen (TYLENOL) 650 MG CR tablet Take 650-1,300 mg by mouth every 8 (eight) hours as needed for pain.    [provider]  amLODipine (NORVASC) 5 MG tablet Take 5 mg by mouth every evening.    [provider]  aspirin EC 81 MG tablet Take 1 tablet (81 mg total) by mouth 2 (two) times daily for 30 days. TO PREVENT BLOOD CLOTS 11/28/18 12/28/18  Chadwell, Vonna Kotyk, PA-C  atorvastatin (LIPITOR) 10 MG tablet Take 10 mg by mouth daily.    [provider]  bismuth subsalicylate (PEPTO BISMOL) 262 MG/15ML suspension Take 30 mLs by mouth every 6 (six) hours as needed for indigestion.    [provider]  carbamide peroxide (DEBROX) 6.5 % OTIC solution Apply 5 drops in each ear daily for 3 days.Wait overnight after last dose and irrigate ears the next day. Repeat monthly as needed 12/19/18   Chase Picket, MD  FERROUS SULFATE PO Take 1 tablet by mouth daily.    [provider]  Glucosamine-Chondroitin-MSM [phone] MG TABS Take 3 tablets by mouth daily.    [provider]  Homeopathic Products (LEG CRAMP RELIEF) TABS Take 1-2 tablets by mouth daily as needed (leg cramps).    [provider]  losartan (COZAAR) 100 MG tablet Take 100 mg by mouth daily.    [provider]  magnesium oxide (MAG-OX) 400 MG tablet Take 400 mg by mouth daily.    [provider]  Melatonin 10 MG CAPS Take 10-20 mg by mouth at bedtime.    [provider]  meloxicam (MOBIC) 15 MG tablet Take 1 tablet (15 mg total) by mouth daily. 12/17/18   Edrick Kins, DPM  Multiple Vitamin (MULTIVITAMIN WITH MINERALS) TABS tablet Take 2 tablets by mouth daily.    [provider]  OVER THE COUNTER MEDICATION Take 1 tablet by mouth daily. Kidney Factors otc supplement    [provider]  Probiotic CAPS Take 1  capsule by mouth daily.    [provider]  vitamin B-12 (CYANOCOBALAMIN) 1000 MCG tablet Take 1,000 mcg by mouth daily.    [provider]    Family History Family History  Problem Relation Age of Onset  . Healthy Mother   . Healthy Father   . Stroke Sister   . Cancer Sister        breast    Social History Social History   Tobacco Use  . Smoking status: Former Smoker    Quit date: 06/26/1971    Years since quitting: 47.5  . Smokeless tobacco: Never Used  Substance Use Topics  . Alcohol use: Yes    Comment: socially   . Drug use: No     Allergies   Hydrocodone   Review of Systems Review of Systems  HENT: Negative for congestion, ear discharge, ear pain, mouth sores, postnasal drip, sinus pressure and sinus pain.   Eyes: Negative for pain, discharge and itching.  Respiratory: Negative.   Gastrointestinal: Negative.      Physical Exam Triage Vital Signs ED Triage Vitals  Enc Vitals Group     BP 12/19/18 1215 109/74     Pulse Rate 12/19/18 1215 80     Resp 12/19/18 1215 18     Temp 12/19/18 1215 98.4 F (36.9 C)     Temp Source 12/19/18 1215 Oral     SpO2 12/19/18 1215 100 %     Weight --      Height --      Head Circumference --      Peak Flow --      Pain Score 12/19/18 1213 0     Pain Loc --      Pain Edu? --      Excl. in [place]? --    No data found.  Updated Vital Signs BP 109/74 (BP Location: Right Arm)   Pulse 80   Temp 98.4 F (36.9 C) (Oral)   Resp 18   SpO2 100%   Visual Acuity Right Eye Distance:   Left Eye Distance:   Bilateral Distance:    Right Eye Near:   Left Eye Near:    Bilateral Near:     Physical Exam Constitutional:      Appearance: He is not ill-appearing or toxic-appearing.  HENT:     Right Ear: Tympanic membrane, ear canal and external ear normal. There is no impacted cerumen.     Left Ear: Tympanic membrane, ear canal and external ear normal. There is no impacted cerumen.     Nose: Nose normal. No  congestion.     Mouth/Throat:     Mouth: Mucous membranes are moist.     Pharynx: No oropharyngeal exudate or posterior oropharyngeal erythema.  Cardiovascular:  Rate and Rhythm: Normal rate and regular rhythm.     Heart sounds: No murmur. No friction rub.  Pulmonary:     Effort: Pulmonary effort is normal.     Breath sounds: Normal breath sounds.  Abdominal:     General: Bowel sounds are normal.     Palpations: Abdomen is soft.  Skin:    Capillary Refill: Capillary refill takes less than 2 seconds.  Neurological:     Mental Status: He is alert.      UC Treatments / Results  Labs (all labs ordered are listed, but only abnormal results are displayed) Labs Reviewed - No data to display  EKG None  Radiology Dg Foot Complete Left  Result Date: 12/17/2018 Please see detailed radiograph report in office note.   Procedures Procedures (including critical care time)  Medications Ordered in UC Medications - No data to display  Initial Impression / Assessment and Plan / UC Course  I have reviewed the triage vital signs and the nursing notes.  Pertinent labs & imaging results that were available during my care of the patient were reviewed by me and considered in my medical decision making (see chart for details).     1.  Bilateral ear wax, no signs of impaction Debrox Ear irrigation is not indicated Patient is counseled to use Debrox over the next few days to reduce the volume of wax in his ears.  He can repeat that anytime he feels that he has earwax buildup.  Patient has ear irrigation device at home and he could use that as well. Final Clinical Impressions(s) / UC Diagnoses   Final diagnoses:  Bilateral impacted cerumen   Discharge Instructions   None    ED Prescriptions    Medication Sig Dispense Auth. Provider   carbamide peroxide (DEBROX) 6.5 % OTIC solution Apply 5 drops in each ear daily for 3 days.Wait overnight after last dose and irrigate ears the next  day. Repeat monthly as needed 15 mL , Myrene Galas, MD     Controlled Substance Prescriptions [place] Controlled Substance Registry consulted? No   Chase Picket, MD 12/19/18 1919

## 2018-12-19 NOTE — ED Triage Notes (Signed)
Patient presents to Urgent Care with complaints of needing his ears cleaned out since not wanting to clean them himself at home. Patient reports his hearing has not changed.

## 2018-12-20 NOTE — Progress Notes (Signed)
   Subjective: 68 y.o. male presenting today as a new patient with a chief complaint of burning pain to the plantar aspect of the left heel that began 2-3 months ago. Changing position of the foot sometimes helps alleviate the pain. Standing and walking for long periods of time makes the pain worse. He has not done anything for pain. Patient is here for further evaluation and treatment.   Past Medical History:  Diagnosis Date  . Arthritis    cervical spondylotic, radiculopathy  . Bradycardia    a. HR previously reported as 40 at PCP's office; event monitor 2017 showed nocturnal bradycardia but no daytime findings.  . Chronic kidney disease, stage III (moderate) ([place])   . Hypercholesteremia   . Hypertension   . Premature atrial contractions   . Prostate cancer ([place])   . PVC's (premature ventricular contractions)      Objective: Physical Exam General: The patient is alert and oriented x3 in no acute distress.  Dermatology: Skin is warm, dry and supple bilateral lower extremities. Negative for open lesions or macerations bilateral.   Vascular: Dorsalis Pedis and Posterior Tibial pulses palpable bilateral.  Capillary fill time is immediate to all digits.  Neurological: Epicritic and protective threshold intact bilateral.   Musculoskeletal: Tenderness to palpation to the plantar aspect of the left heel along the plantar fascia. All other joints range of motion within normal limits bilateral. Strength 5/5 in all groups bilateral.   Radiographic exam: Normal osseous mineralization. Joint spaces preserved. No fracture/dislocation/boney destruction. No other soft tissue abnormalities or radiopaque foreign bodies.   Assessment: 1. Plantar fasciitis left foot  Plan of Care:  1. Patient evaluated. Xrays reviewed.   2. Injection of 0.5cc Celestone soluspan injected into the left plantar fascia.  3. Rx for Meloxicam ordered for patient. 4. Plantar fascial band(s) dispensed  5. Instructed  patient regarding therapies and modalities at home to alleviate symptoms.  6. Return to clinic in 4 weeks.     Edrick Kins, DPM [facility]  Dr. Edrick Kins, DPM    [address], [place] [address]                Office [phone]  Fax [phone]

## 2018-12-29 DIAGNOSIS — M1612 Unilateral primary osteoarthritis, left hip: Secondary | ICD-10-CM | POA: Diagnosis not present

## 2019-01-01 DIAGNOSIS — M25512 Pain in left shoulder: Secondary | ICD-10-CM | POA: Diagnosis not present

## 2019-01-01 DIAGNOSIS — M542 Cervicalgia: Secondary | ICD-10-CM | POA: Diagnosis not present

## 2019-01-07 DIAGNOSIS — M5412 Radiculopathy, cervical region: Secondary | ICD-10-CM | POA: Diagnosis not present

## 2019-01-08 DIAGNOSIS — M25512 Pain in left shoulder: Secondary | ICD-10-CM | POA: Diagnosis not present

## 2019-01-19 DIAGNOSIS — G5622 Lesion of ulnar nerve, left upper limb: Secondary | ICD-10-CM | POA: Diagnosis not present

## 2019-01-22 DIAGNOSIS — G5622 Lesion of ulnar nerve, left upper limb: Secondary | ICD-10-CM | POA: Diagnosis not present

## 2019-01-29 DIAGNOSIS — M1612 Unilateral primary osteoarthritis, left hip: Secondary | ICD-10-CM | POA: Diagnosis not present

## 2019-02-05 DIAGNOSIS — G5622 Lesion of ulnar nerve, left upper limb: Secondary | ICD-10-CM | POA: Diagnosis not present

## 2019-02-11 DIAGNOSIS — M25522 Pain in left elbow: Secondary | ICD-10-CM | POA: Diagnosis not present

## 2019-02-13 ENCOUNTER — Other Ambulatory Visit: Payer: Self-pay | Admitting: Podiatry

## 2019-02-13 DIAGNOSIS — I4901 Ventricular fibrillation: Secondary | ICD-10-CM | POA: Diagnosis not present

## 2019-02-19 DIAGNOSIS — M25522 Pain in left elbow: Secondary | ICD-10-CM | POA: Diagnosis not present

## 2019-02-25 DIAGNOSIS — G5621 Lesion of ulnar nerve, right upper limb: Secondary | ICD-10-CM | POA: Diagnosis not present

## 2019-02-25 DIAGNOSIS — M19021 Primary osteoarthritis, right elbow: Secondary | ICD-10-CM | POA: Diagnosis not present

## 2019-02-25 DIAGNOSIS — G5622 Lesion of ulnar nerve, left upper limb: Secondary | ICD-10-CM | POA: Diagnosis not present

## 2019-02-26 DIAGNOSIS — G5622 Lesion of ulnar nerve, left upper limb: Secondary | ICD-10-CM | POA: Diagnosis not present

## 2019-03-01 DIAGNOSIS — M1612 Unilateral primary osteoarthritis, left hip: Secondary | ICD-10-CM | POA: Diagnosis not present

## 2019-03-05 DIAGNOSIS — C61 Malignant neoplasm of prostate: Secondary | ICD-10-CM | POA: Diagnosis not present

## 2019-03-09 DIAGNOSIS — G5622 Lesion of ulnar nerve, left upper limb: Secondary | ICD-10-CM | POA: Diagnosis not present

## 2019-03-11 DIAGNOSIS — S5402XD Injury of ulnar nerve at forearm level, left arm, subsequent encounter: Secondary | ICD-10-CM | POA: Diagnosis not present

## 2019-03-11 DIAGNOSIS — M19022 Primary osteoarthritis, left elbow: Secondary | ICD-10-CM | POA: Diagnosis not present

## 2019-03-12 DIAGNOSIS — N400 Enlarged prostate without lower urinary tract symptoms: Secondary | ICD-10-CM | POA: Diagnosis not present

## 2019-03-12 DIAGNOSIS — Z8546 Personal history of malignant neoplasm of prostate: Secondary | ICD-10-CM | POA: Diagnosis not present

## 2019-03-18 ENCOUNTER — Other Ambulatory Visit: Payer: Self-pay | Admitting: Podiatry

## 2019-03-18 DIAGNOSIS — L821 Other seborrheic keratosis: Secondary | ICD-10-CM | POA: Diagnosis not present

## 2019-03-18 DIAGNOSIS — D225 Melanocytic nevi of trunk: Secondary | ICD-10-CM | POA: Diagnosis not present

## 2019-03-18 DIAGNOSIS — L819 Disorder of pigmentation, unspecified: Secondary | ICD-10-CM | POA: Diagnosis not present

## 2019-03-24 DIAGNOSIS — S5402XD Injury of ulnar nerve at forearm level, left arm, subsequent encounter: Secondary | ICD-10-CM | POA: Diagnosis not present

## 2019-03-24 DIAGNOSIS — M19022 Primary osteoarthritis, left elbow: Secondary | ICD-10-CM | POA: Diagnosis not present

## 2019-03-27 DIAGNOSIS — S5402XD Injury of ulnar nerve at forearm level, left arm, subsequent encounter: Secondary | ICD-10-CM | POA: Diagnosis not present

## 2019-03-27 DIAGNOSIS — M19022 Primary osteoarthritis, left elbow: Secondary | ICD-10-CM | POA: Diagnosis not present

## 2019-03-31 DIAGNOSIS — M1612 Unilateral primary osteoarthritis, left hip: Secondary | ICD-10-CM | POA: Diagnosis not present

## 2019-04-03 DIAGNOSIS — S5402XD Injury of ulnar nerve at forearm level, left arm, subsequent encounter: Secondary | ICD-10-CM | POA: Diagnosis not present

## 2019-04-03 DIAGNOSIS — M19022 Primary osteoarthritis, left elbow: Secondary | ICD-10-CM | POA: Diagnosis not present

## 2019-04-06 DIAGNOSIS — M19022 Primary osteoarthritis, left elbow: Secondary | ICD-10-CM | POA: Diagnosis not present

## 2019-04-07 DIAGNOSIS — S5402XD Injury of ulnar nerve at forearm level, left arm, subsequent encounter: Secondary | ICD-10-CM | POA: Diagnosis not present

## 2019-04-07 DIAGNOSIS — M19022 Primary osteoarthritis, left elbow: Secondary | ICD-10-CM | POA: Diagnosis not present

## 2019-04-09 DIAGNOSIS — M19022 Primary osteoarthritis, left elbow: Secondary | ICD-10-CM | POA: Diagnosis not present

## 2019-04-09 DIAGNOSIS — S5402XD Injury of ulnar nerve at forearm level, left arm, subsequent encounter: Secondary | ICD-10-CM | POA: Diagnosis not present

## 2019-04-13 DIAGNOSIS — S5402XD Injury of ulnar nerve at forearm level, left arm, subsequent encounter: Secondary | ICD-10-CM | POA: Diagnosis not present

## 2019-04-13 DIAGNOSIS — M19022 Primary osteoarthritis, left elbow: Secondary | ICD-10-CM | POA: Diagnosis not present

## 2019-04-15 DIAGNOSIS — S5402XD Injury of ulnar nerve at forearm level, left arm, subsequent encounter: Secondary | ICD-10-CM | POA: Diagnosis not present

## 2019-04-15 DIAGNOSIS — M19022 Primary osteoarthritis, left elbow: Secondary | ICD-10-CM | POA: Diagnosis not present

## 2019-04-22 NOTE — Progress Notes (Signed)
Cardiology Office Note    Date:  04/24/2019   ID:  Dennis Huffman, DOB 1950/07/11, MRN [id number]  PCP:  [name], MD  Cardiologist:  Larae Grooms, MD  Electrophysiologist:  None   Chief Complaint: 1 year f/u palpitations and bradycardia  History of Present Illness:   Dennis Huffman is a 68 y.o. male with history of palpitations, baseline sinus bradycardia, HTN, HLD (followed by primary care), probable CKD stage II who presents routine 1 year follow-up.   He previously established care with Dr. Irish Lack in 2014 for palpitations and bradycardia. He'd developed some palpitations after taking hydrocodone. He'd been seen in his PCP's office and HR was reported to be 40bpm, prompting cardiology evaluation. Per Dr. Irish Lack, he had a negative workup around that time. More recently, Holter 12/2015 showed NSR with rare PACs/PVCs and no pathologic arrhythmias. There was mention of bradycardia which was somewhat confusing on report as this reported HR of 29bpm at 11:06 pm but later in report indicated minimum HR was 45bpm at 4:55am. There were no corresponding strips to the 29bpm. Nevertheless Dr. Irish Lack reviewed report and considered it benign. His average HR at that time was reassuring at 68bpm. At last OV 03/2018 he was doing well and cleared for hip surgery. Last labs 11/2018 showed K 4.4, Cr 1.00, glucose 144, WBC 12.7, Hgb 10.5 (CBC post-hip replacement), TChol 174, LDL 83, trig 138, ALT wnl.  He is seen back for follow-up doing well from cardiac standpoint. His palpitations have resolved. No anginal symptoms. No CP, SOB, edema, syncope, orthopnea reported. He is unsure how much activity he should be doing with his new hip so we discussed touching base with his orthopedic team. He was able to walk the dog a long way with grandson recently without any concerning cardiac symptoms (but did feel soreness in his hip the days after).   Past Medical History:  Diagnosis Date  . Arthritis     cervical spondylotic, radiculopathy  . Bradycardia    a. HR previously reported as 40 at PCP's office; event monitor 2017 showed nocturnal bradycardia but no daytime findings.  . CKD (chronic kidney disease), stage II   . Hypercholesteremia   . Hypertension   . Premature atrial contractions   . Prostate cancer ([place])   . PVC's (premature ventricular contractions)     Past Surgical History:  Procedure Laterality Date  . ANTERIOR CERVICAL DECOMP/DISCECTOMY FUSION N/A 05/12/2015   Procedure: ANTERIOR CERVICAL DECOMPRESSION/DISCECTOMY FUSION C3 - C6 3 LEVELS;  Surgeon: Melina Schools, MD;  Location: [facility];  Service: Orthopedics;  Laterality: N/A;  . colonscopy    . COLONSCOPY     . INSERTION PROSTATE RADIATION SEED  UNSURE OF DATE   DONE BY DR Karsten Ro   . PROSTATE BIOPSY    . ROTATOR CUFF REPAIR Right 03-13-12; 09-27-15  . TOTAL HIP ARTHROPLASTY Left 11/28/2018   Procedure: TOTAL HIP ARTHROPLASTY;  Surgeon: Earlie Server, MD;  [facility];  Service: Orthopedics;  Laterality: Left;    Current Medications: Current Meds  Medication Sig  . acetaminophen (TYLENOL) 650 MG CR tablet Take 650-1,300 mg by mouth every 8 (eight) hours as needed for pain.  [name] amLODipine (NORVASC) 5 MG tablet Take 5 mg by mouth every evening.  [name] atorvastatin (LIPITOR) 10 MG tablet Take 10 mg by mouth daily.  [name] bismuth subsalicylate (PEPTO BISMOL) 262 MG/15ML suspension Take 30 mLs by mouth every 6 (six) hours as needed for indigestion.  [name] FERROUS SULFATE PO  Take 1 tablet by mouth daily.  . Glucosamine-Chondroitin-MSM [id number] MG TABS Take 3 tablets by mouth daily.  [name] losartan (COZAAR) 100 MG tablet Take 100 mg by mouth daily.  . magnesium oxide (MAG-OX) 400 MG tablet Take 400 mg by mouth daily.  . Melatonin 10 MG CAPS Take 10-20 mg by mouth at bedtime.  . Multiple Vitamin (MULTIVITAMIN WITH MINERALS) TABS tablet Take 2 tablets by mouth daily.  [name] oxyCODONE (OXY IR/ROXICODONE) 5 MG immediate release tablet  Take one tab po q4-6hrs prn pain, may need 1-2 first couple weeks  . Probiotic CAPS Take 1 capsule by mouth daily.  . vitamin B-12 (CYANOCOBALAMIN) 1000 MCG tablet Take 1,000 mcg by mouth daily.     Allergies:   Hydrocodone   Social History   Socioeconomic History  . Marital status: Single    Spouse name: Not on file  . Number of children: Not on file  . Years of education: Not on file  . Highest education level: Not on file  Occupational History  . Not on file  Social Needs  . Financial resource strain: Not on file  . Food insecurity    Worry: Not on file    Inability: Not on file  . Transportation needs    Medical: Not on file    Non-medical: Not on file  Tobacco Use  . Smoking status: Former Smoker    Quit date: 06/26/1971    Years since quitting: 47.8  . Smokeless tobacco: Never Used  Substance and Sexual Activity  . Alcohol use: Yes    Comment: socially   . Drug use: No  . Sexual activity: Yes  Lifestyle  . Physical activity    Days per week: Not on file    Minutes per session: Not on file  . Stress: Not on file  Relationships  . Social Herbalist on phone: Not on file    Gets together: Not on file    Attends religious service: Not on file    Active member of club or organization: Not on file    Attends meetings of clubs or organizations: Not on file    Relationship status: Not on file  Other Topics Concern  . Not on file  Social History Narrative  . Not on file     Family History:  The patient's family history includes Cancer in his sister; Healthy in his father and mother; Stroke in his sister.  ROS:   Please see the history of present illness. .  All other systems are reviewed and otherwise negative.    EKGs/Labs/Other Studies Reviewed:    Studies reviewed were summarized above.   EKG:  EKG is ordered today, personally reviewed, demonstrating NSR 62bpm, baseline wander/artifact in beginning of tracing but otherwise normal  Recent  Labs: 11/29/2018: BUN 18; Creatinine, Ser 1.00; Hemoglobin 10.5; Platelets 157; Potassium 4.4; Sodium 135  Recent Lipid Panel No results found for: CHOL, TRIG, HDL, CHOLHDL, VLDL, LDLCALC, LDLDIRECT  PHYSICAL EXAM:    VS:  BP 138/82   Pulse 62   Ht 5\' 11"  (1.803 m)   Wt 242 lb (109.8 kg)   BMI 33.75 kg/m   BMI: Body mass index is 33.75 kg/m.  GEN: Well nourished, well developed AAM, in no acute distress HEENT: normocephalic, atraumatic Neck: no JVD, carotid bruits, or masses Cardiac: RRR; no murmurs, rubs, or gallops, no edema  Respiratory:  clear to auscultation bilaterally, normal work of breathing GI: soft, nontender, nondistended, + BS  MS: no deformity or atrophy Skin: warm and dry, no rash Neuro:  Alert and Oriented x 3, Strength and sensation are intact, follows commands Psych: euthymic mood, full affect  Wt Readings from Last 3 Encounters:  04/24/19 242 lb (109.8 kg)  11/28/18 229 lb (103.9 kg)  11/14/18 229 lb (103.9 kg)     ASSESSMENT & PLAN:   1. PACs/PVCs - quiescent. If this recurs, would consider baseline 2D echocardiogram. However, given normal EKG and no cardiac symptoms, would hold off. 2. Sinus bradycardia - asymptomatic with baseline HR 60s. Average HR by monitor was normal in 2017. 3. Essential HTN - BP mildly elevated but was 0000000 systolic during ED visit [id number]. Discussed home monitoring plan with patient as well as lifestyle modifications. Instructions for checking BP outlined on AVS with patient. The patient was instructed to monitor their blood pressure at home and to call if tending to run higher than 130/80. If this were the case, would titrate amlodipine to 10mg  daily.  Disposition: F/u with Dr. Irish Lack in 1 year.  Medication Adjustments/Labs and Tests Ordered: Current medicines are reviewed at length with the patient today.  Concerns regarding medicines are outlined above. Medication changes, Labs and Tests ordered today are summarized above and  listed in the Patient Instructions accessible in Encounters.   Signed, Charlie Pitter, PA-C  04/24/2019 3:02 PM    [facility] [address], [place], [place]  [address] Phone: [phone]; Fax: [phone]

## 2019-04-24 ENCOUNTER — Encounter: Payer: Self-pay | Admitting: Physician Assistant

## 2019-04-24 ENCOUNTER — Other Ambulatory Visit: Payer: Self-pay

## 2019-04-24 ENCOUNTER — Ambulatory Visit: Payer: Medicare HMO | Admitting: Physician Assistant

## 2019-04-24 VITALS — BP 138/82 | HR 62 | Ht 71.0 in | Wt 242.0 lb

## 2019-04-24 DIAGNOSIS — R001 Bradycardia, unspecified: Secondary | ICD-10-CM

## 2019-04-24 DIAGNOSIS — I10 Essential (primary) hypertension: Secondary | ICD-10-CM | POA: Diagnosis not present

## 2019-04-24 DIAGNOSIS — I491 Atrial premature depolarization: Secondary | ICD-10-CM | POA: Diagnosis not present

## 2019-04-24 DIAGNOSIS — I493 Ventricular premature depolarization: Secondary | ICD-10-CM

## 2019-04-24 NOTE — Patient Instructions (Addendum)
Medication Instructions:  Your physician recommends that you continue on your current medications as directed. Please refer to the Current Medication list given to you today.  *If you need a refill on your cardiac medications before your next appointment, please call your pharmacy*  Lab Work: None ordered  If you have labs (blood work) drawn today and your tests are completely normal, you will receive your results only by: [name] MyChart Message (if you have MyChart) OR . A paper copy in the mail If you have any lab test that is abnormal or we need to change your treatment, we will call you to review the results.  Testing/Procedures: None ordered  Follow-Up: At [facility], you and your health needs are our priority.  As part of our continuing mission to provide you with exceptional heart care, we have created designated Provider Care Teams.  These Care Teams include your primary Cardiologist (physician) and Advanced Practice Providers (APPs -  Physician Assistants and Nurse Practitioners) who all work together to provide you with the care you need, when you need it.  Your next appointment:   12 months  The format for your next appointment:   In Person  Provider:   You may see Larae Grooms, MD or one of the following Advanced Practice Providers on your designated Care Team:    Melina Copa, PA-C  Ermalinda Barrios, PA-C   Other Instructions  Please get a blood pressure cuff that goes on your arm to monitor your blood pressure intermittently. The wrist ones can be inaccurate. If possible, try to select one that also reports your heart rate. To check your blood pressure, choose a time about 3 hours after taking your blood pressure medicines. Remain seated in a chair for 5 minutes quietly beforehand, then check it. Call us if you tend to get readings of greater than 130 on the top number or 80 on the bottom number.

## 2019-04-29 DIAGNOSIS — H40033 Anatomical narrow angle, bilateral: Secondary | ICD-10-CM | POA: Diagnosis not present

## 2019-04-29 DIAGNOSIS — H2513 Age-related nuclear cataract, bilateral: Secondary | ICD-10-CM | POA: Diagnosis not present

## 2019-04-30 DIAGNOSIS — H43393 Other vitreous opacities, bilateral: Secondary | ICD-10-CM | POA: Diagnosis not present

## 2019-05-01 DIAGNOSIS — M1612 Unilateral primary osteoarthritis, left hip: Secondary | ICD-10-CM | POA: Diagnosis not present

## 2019-05-13 DIAGNOSIS — R413 Other amnesia: Secondary | ICD-10-CM | POA: Diagnosis not present

## 2019-05-27 DIAGNOSIS — H833X3 Noise effects on inner ear, bilateral: Secondary | ICD-10-CM | POA: Diagnosis not present

## 2019-05-27 DIAGNOSIS — Z8546 Personal history of malignant neoplasm of prostate: Secondary | ICD-10-CM | POA: Diagnosis not present

## 2019-05-27 DIAGNOSIS — H9313 Tinnitus, bilateral: Secondary | ICD-10-CM | POA: Diagnosis not present

## 2019-05-27 DIAGNOSIS — I10 Essential (primary) hypertension: Secondary | ICD-10-CM | POA: Diagnosis not present

## 2019-05-27 DIAGNOSIS — E7800 Pure hypercholesterolemia, unspecified: Secondary | ICD-10-CM | POA: Diagnosis not present

## 2019-05-27 DIAGNOSIS — I700 Atherosclerosis of aorta: Secondary | ICD-10-CM | POA: Diagnosis not present

## 2019-05-27 DIAGNOSIS — H9193 Unspecified hearing loss, bilateral: Secondary | ICD-10-CM | POA: Diagnosis not present

## 2019-05-27 DIAGNOSIS — I499 Cardiac arrhythmia, unspecified: Secondary | ICD-10-CM | POA: Diagnosis not present

## 2019-05-27 DIAGNOSIS — R413 Other amnesia: Secondary | ICD-10-CM | POA: Diagnosis not present

## 2019-05-28 ENCOUNTER — Telehealth: Payer: Self-pay | Admitting: Neurology

## 2019-05-28 NOTE — Telephone Encounter (Signed)
Called and lvm for pt to see if pt can come on 06/01/19 @2  per Emma/if pt calls baclk plz sched apt and cancel the one for 06/02/19.

## 2019-05-31 DIAGNOSIS — M1612 Unilateral primary osteoarthritis, left hip: Secondary | ICD-10-CM | POA: Diagnosis not present

## 2019-06-01 ENCOUNTER — Telehealth: Payer: Self-pay | Admitting: *Deleted

## 2019-06-01 ENCOUNTER — Other Ambulatory Visit: Payer: Self-pay

## 2019-06-01 ENCOUNTER — Ambulatory Visit (INDEPENDENT_AMBULATORY_CARE_PROVIDER_SITE_OTHER): Payer: Medicare HMO | Admitting: Neurology

## 2019-06-01 ENCOUNTER — Encounter: Payer: Self-pay | Admitting: Neurology

## 2019-06-01 VITALS — BP 160/83 | HR 55 | Temp 98.0°F | Ht 71.0 in | Wt 240.0 lb

## 2019-06-01 DIAGNOSIS — G4700 Insomnia, unspecified: Secondary | ICD-10-CM

## 2019-06-01 DIAGNOSIS — F320 Major depressive disorder, single episode, mild: Secondary | ICD-10-CM

## 2019-06-01 DIAGNOSIS — F32A Depression, unspecified: Secondary | ICD-10-CM

## 2019-06-01 DIAGNOSIS — R413 Other amnesia: Secondary | ICD-10-CM | POA: Diagnosis not present

## 2019-06-01 MED ORDER — BUPROPION HCL ER (XL) 150 MG PO TB24: 150.0000 mg | ORAL_TABLET | Freq: Every day | ORAL | 5 refills | Status: DC

## 2019-06-01 NOTE — Progress Notes (Signed)
[facility]  PATIENT: Dennis [place] DOB: February 12, 1951  REFERRING DOCTOR OR PCP:  Dr. Lysle Rubens SOURCE: Patient, Notes from Dr. Lysle Rubens, laboratory reports,  _________________________________   HISTORICAL  CHIEF COMPLAINT:  Chief Complaint  Patient presents with   New Patient (Initial Visit)    RM 13, alone. Paper referral from Dr. Lysle Rubens for memory changes. [place] 17/20    HISTORY OF PRESENT ILLNESS: I had the pleasure of seeing your patient, Latavian Bricker, at [facility] for neurologic consultation regarding his memory loss.  He is a 68 year old man who has noticed difficulties with his memory over the past couple months.    The onset was gradual over this time.  Of note, he notes memory problems more than those around him.    He recalls a few examples.   He did not remember some appointments.    He is driving well and has not been lost.    If he gets a hint, he will remember,   He feels he is focusing worse than he used to.    He denies much depression but does note mild apathy compared to earlier this year.[name]  He tries to get outdoors some but is not going to the gym as he used to.     He served in [place] and denies any PTSD symptoms.      He is sleeping more poorly.   He typically goes to bed at 11 pm but wakes up around 2 am and then has trouble getting back to sleep.  Melatonin has helped some.   He denies being told that he snores.      He has had thyroid tests and B12 tests.  B12 was 317 and TSH 1.07.  He notes some tingling in the left arm but no numbness in feet.   Gait is unchanged.     He has a h/o of prostate cancer, treated with radiation.     He has a 9th grade education.   Montreal Cognitive Assessment  06/01/2019  Visuospatial/ Executive (0/5) 4  Naming (0/3) 3  Attention: Read list of digits (0/2) 2  Attention: Read list of letters (0/1) 1  Attention: Serial 7 subtraction starting at 100 (0/3) 3  Language: Repeat phrase  (0/2) 2  Language : Fluency (0/1) 0  Abstraction (0/2) 0  Delayed Recall (0/5) 0  Orientation (0/6) 5  Total 20  Adjusted Score (based on education) 21   (Serial threes were substituted for serial sevens due to education level)  REVIEW OF SYSTEMS: Constitutional: No fevers, chills, sweats, or change in appetite Eyes: No visual changes, double vision, eye pain Ear, nose and throat: No hearing loss, ear pain, nasal congestion, sore throat Cardiovascular: No chest pain, palpitations Respiratory: No shortness of breath at rest or with exertion.   No wheezes GastrointestinaI: No nausea, vomiting, diarrhea, abdominal pain, fecal incontinence Genitourinary: No dysuria, urinary retention or frequency.  No nocturia. Musculoskeletal: No neck pain, back pain Integumentary: No rash, pruritus, skin lesions Neurological: as above Psychiatric: No depression at this time.  No anxiety Endocrine: No palpitations, diaphoresis, change in appetite, change in weigh or increased thirst Hematologic/Lymphatic: No anemia, purpura, petechiae. Allergic/Immunologic: No itchy/runny eyes, nasal congestion, recent allergic reactions, rashes  ALLERGIES: Allergies  Allergen Reactions   Hydrocodone Palpitations    HOME MEDICATIONS:  Current Outpatient Medications:    acetaminophen (TYLENOL) 650 MG CR tablet, Take 650-1,300 mg by mouth every 8 (eight) hours as needed for pain.,  Disp: , Rfl:    amLODipine (NORVASC) 5 MG tablet, Take 5 mg by mouth every evening., Disp: , Rfl:    atorvastatin (LIPITOR) 10 MG tablet, Take 10 mg by mouth daily., Disp: , Rfl:    bismuth subsalicylate (PEPTO BISMOL) 262 MG/15ML suspension, Take 30 mLs by mouth every 6 (six) hours as needed for indigestion., Disp: , Rfl:    FERROUS SULFATE PO, Take 1 tablet by mouth daily., Disp: , Rfl:    Glucosamine-Chondroitin-MSM [id number] MG TABS, Take 3 tablets by mouth daily., Disp: , Rfl:    losartan (COZAAR) 100 MG tablet, Take  100 mg by mouth daily., Disp: , Rfl:    magnesium oxide (MAG-OX) 400 MG tablet, Take 400 mg by mouth daily., Disp: , Rfl:    Melatonin 10 MG CAPS, Take 10-20 mg by mouth at bedtime., Disp: , Rfl:    Multiple Vitamin (MULTIVITAMIN WITH MINERALS) TABS tablet, Take 2 tablets by mouth daily., Disp: , Rfl:    oxyCODONE (OXY IR/ROXICODONE) 5 MG immediate release tablet, Take one tab po q4-6hrs prn pain, may need 1-2 first couple weeks, Disp: 40 tablet, Rfl: 0   Probiotic CAPS, Take 1 capsule by mouth daily., Disp: , Rfl:    vitamin B-12 (CYANOCOBALAMIN) 1000 MCG tablet, Take 1,000 mcg by mouth daily., Disp: , Rfl:    buPROPion (WELLBUTRIN XL) 150 MG 24 hr tablet, Take 1 tablet (150 mg total) by mouth daily., Disp: 30 tablet, Rfl: 5  PAST MEDICAL HISTORY: Past Medical History:  Diagnosis Date   Arthritis    cervical spondylotic, radiculopathy   Bradycardia    a. HR previously reported as 40 at PCP's office; event monitor 2017 showed nocturnal bradycardia but no daytime findings.   CKD (chronic kidney disease), stage II    Hypercholesteremia    Hypertension    Premature atrial contractions    Prostate cancer (HCC)    PVC's (premature ventricular contractions)     PAST SURGICAL HISTORY: Past Surgical History:  Procedure Laterality Date   ANTERIOR CERVICAL DECOMP/DISCECTOMY FUSION N/A 05/12/2015   Procedure: ANTERIOR CERVICAL DECOMPRESSION/DISCECTOMY FUSION C3 - C6 3 LEVELS;  Surgeon: Melina Schools, MD;  Location: [facility];  Service: Orthopedics;  Laterality: N/A;   colonscopy     COLONSCOPY      INSERTION PROSTATE RADIATION SEED  UNSURE OF DATE   DONE BY DR Karsten Ro    PROSTATE BIOPSY     ROTATOR CUFF REPAIR Right 03-13-12; 09-27-15   TOTAL HIP ARTHROPLASTY Left 11/28/2018   Procedure: TOTAL HIP ARTHROPLASTY;  Surgeon: Earlie Server, MD;  Location: [facility];  Service: Orthopedics;  Laterality: Left;    FAMILY HISTORY: Family History  Problem Relation Age of Onset    Healthy Mother    Healthy Father    Stroke Sister    Cancer Sister        breast    SOCIAL HISTORY:  Social History   Socioeconomic History   Marital status: Single    Spouse name: Not on file   Number of children: Not on file   Years of education: Not on file   Highest education level: Not on file  Occupational History   Not on file  Social Needs   Financial resource strain: Not on file   Food insecurity    Worry: Not on file    Inability: Not on file   Transportation needs    Medical: Not on file    Non-medical: Not on file  Tobacco Use  Smoking status: Former Smoker    Quit date: 06/26/1971    Years since quitting: 47.9   Smokeless tobacco: Never Used  Substance and Sexual Activity   Alcohol use: Yes    Comment: socially    Drug use: No   Sexual activity: Yes  Lifestyle   Physical activity    Days per week: Not on file    Minutes per session: Not on file   Stress: Not on file  Relationships   Social connections    Talks on phone: Not on file    Gets together: Not on file    Attends religious service: Not on file    Active member of club or organization: Not on file    Attends meetings of clubs or organizations: Not on file    Relationship status: Not on file   Intimate partner violence    Fear of current or ex partner: Not on file    Emotionally abused: Not on file    Physically abused: Not on file    Forced sexual activity: Not on file  Other Topics Concern   Not on file  Social History Narrative   Right handed    Caffeine use: 1 cup coffee per day   Soda- not on a regular basis   Lives with mother, sister, brother, and nephew.     PHYSICAL EXAM  Vitals:   06/01/19 1358  BP: (!) 160/83  Pulse: (!) 55  Temp: 98 F (36.7 C)  Weight: 240 lb (108.9 kg)  Height: 5\' 11"  (1.803 m)    Body mass index is 33.47 kg/m.   General: The patient is well-developed and well-nourished and in no acute distress  HEENT:  Head is  [place]/AT.  Sclera are anicteric.  Funduscopic exam shows normal optic discs and retinal vessels.  Neck: No carotid bruits are noted.  The neck is nontender.  Cardiovascular: The heart has a regular rate and rhythm with a normal S1 and S2. There were no murmurs, gallops or rubs.    Skin: Extremities are without rash or  edema.  Musculoskeletal:  Back is nontender  Neurologic Exam  Mental status: The patient is alert and oriented x 3 at the time of the examination. The patient has reduced recent memory, with reduced attention span for numbers (100-?) but normalwith simpler math (20 and serial 3's and WORLD  (20-17-14-11-8; WORLD-DLROW.   Speech is normal.  Cranial nerves: Extraocular movements are full. Pupils are equal, round, and reactive to light and accomodation.  Visual fields are full.  Facial symmetry is present. There is good facial sensation to soft touch bilaterally.Facial strength is normal.  Trapezius and sternocleidomastoid strength is normal. No dysarthria is noted.  The tongue is midline, and the patient has symmetric elevation of the soft palate. No obvious hearing deficits are noted.  Motor:  Muscle bulk is normal.   Tone is normal. Strength is  5 / 5 in all 4 extremities.   Sensory: Sensory testing is intact to pinprick, soft touch and vibration sensation in all 4 extremities.  Coordination: Cerebellar testing reveals good finger-nose-finger and heel-to-shin bilaterally.  Gait and station: Station is normal.   Gait is normal. Tandem gait is normal. Romberg is negative.   Reflexes: Deep tendon reflexes are symmetric and normal bilaterally.   Plantar responses are flexor.    DIAGNOSTIC DATA (LABS, IMAGING, TESTING) - I reviewed patient records, labs, notes, testing and imaging myself where available.  Lab Results  Component Value Date  WBC 12.7 (H) 11/29/2018   HGB 10.5 (L) 11/29/2018   HCT 30.5 (L) 11/29/2018   MCV 81.1 11/29/2018   PLT 157 11/29/2018        ASSESSMENT AND PLAN  Memory loss - Plan: MR BRAIN WO CONTRAST  In summary, Mr. Constanza is a 68 year old man who has noted memory loss over the past few months.  Of note, he notes more difficulties with his cognitive function than other people note.  I reviewed the results of the [facility] cognitive assessment with him.  Initially he scored 17/30.  However, when I substituted serial threes for serial sevens his score was 20/30.  That is still low consistent with mild dementia.  However, conversation with him was normal without a hint of significant cognitive dysfunction and he did well with executive function tests.  Therefore, it is possible that much of his cognitive dysfunction is due to reduced focus and attention rather than to a primary neurologic disorder.   He denies depression but has noted some apathy.  He also notes insomnia but denies any sleep apnea symptoms.  We will initiate Wellbutrin therapy as he might have a mild depression playing a role in his cognitive dysfunction.  I will check an MRI of the brain without contrast to rule out structural issues that could lead to memory decline and also to assess if there is any atrophy and what the pattern of atrophy is.  If symptoms persist, consider a sleep study to rule out obstructive sleep apnea and other sleep issues.  If memory decline persists without identifiable cause, consider addition of donepezil or other medication.  Also consider referral for neurocognitive testing if symptoms worsen or do not improve.  He will return to see [place] in 4 months or sooner if there are new or worsening neurologic symptoms.    Lestine Rahe A. Felecia Shelling, MD, [facility] [id number], 0000000 PM Certified in Neurology, Clinical Neurophysiology, Sleep Medicine and Neuroimaging  [facility] [address], [place], [place] [address] [phone]

## 2019-06-01 NOTE — Telephone Encounter (Signed)
Called patient's PCP office at [phone] and LVM for Pam in medical records to fax recent B12/thyroid labs to [place] at [phone]

## 2019-06-01 NOTE — Telephone Encounter (Signed)
Received results, gave to MD

## 2019-06-02 ENCOUNTER — Ambulatory Visit: Payer: Medicare HMO | Admitting: Neurology

## 2019-06-26 ENCOUNTER — Other Ambulatory Visit: Payer: Self-pay | Admitting: Neurology

## 2019-06-29 ENCOUNTER — Other Ambulatory Visit: Payer: Self-pay

## 2019-06-29 ENCOUNTER — Ambulatory Visit
Admission: RE | Admit: 2019-06-29 | Discharge: 2019-06-29 | Disposition: A | Discharge status: Home / Self Care | Payer: Medicare HMO | Source: Ambulatory Visit | Attending: Neurology | Admitting: Neurology

## 2019-06-29 DIAGNOSIS — R413 Other amnesia: Secondary | ICD-10-CM

## 2019-06-30 ENCOUNTER — Telehealth: Payer: Self-pay | Admitting: *Deleted

## 2019-06-30 NOTE — Telephone Encounter (Signed)
-----   Message from Britt Bottom, MD sent at 06/29/2019  7:28 PM EST ----- Please let him know that the MRI of the brain was normal for age.

## 2019-06-30 NOTE — Telephone Encounter (Signed)
Called, LVM for pt relaying per Dr. Felecia Shelling that MRI brain normal for his age. Gave office number if he has further questions.

## 2019-07-01 ENCOUNTER — Other Ambulatory Visit: Payer: Medicare HMO

## 2019-07-01 DIAGNOSIS — M1612 Unilateral primary osteoarthritis, left hip: Secondary | ICD-10-CM | POA: Diagnosis not present

## 2019-07-07 ENCOUNTER — Ambulatory Visit (INDEPENDENT_AMBULATORY_CARE_PROVIDER_SITE_OTHER): Payer: Medicare HMO | Admitting: Interventional Cardiology

## 2019-07-07 ENCOUNTER — Other Ambulatory Visit: Payer: Self-pay

## 2019-07-07 ENCOUNTER — Encounter: Payer: Self-pay | Admitting: Interventional Cardiology

## 2019-07-07 VITALS — BP 138/64 | HR 62 | Ht 71.0 in | Wt 238.4 lb

## 2019-07-07 DIAGNOSIS — R0789 Other chest pain: Secondary | ICD-10-CM

## 2019-07-07 DIAGNOSIS — I10 Essential (primary) hypertension: Secondary | ICD-10-CM

## 2019-07-07 DIAGNOSIS — I491 Atrial premature depolarization: Secondary | ICD-10-CM

## 2019-07-07 DIAGNOSIS — E782 Mixed hyperlipidemia: Secondary | ICD-10-CM | POA: Diagnosis not present

## 2019-07-07 NOTE — Patient Instructions (Signed)
Medication Instructions:  Your physician recommends that you continue on your current medications as directed. Please refer to the Current Medication list given to you today.  If you need a refill on your cardiac medications before your next appointment, please call your pharmacy.   Lab work: None Ordered  If you have labs (blood work) drawn today and your tests are completely normal, you will receive your results only by: [name] MyChart Message (if you have MyChart) OR . A paper copy in the mail If you have any lab test that is abnormal or we need to change your treatment, we will call you to review the results.  Testing/Procedures: Your physician has requested that you have an echocardiogram. Echocardiography is a painless test that uses sound waves to create images of your heart. It provides your doctor with information about the size and shape of your heart and how well your heart's chambers and valves are working. This procedure takes approximately one hour. There are no restrictions for this procedure.  Follow-Up: . Based on test results  Any Other Special Instructions Will Be Listed Below (If Applicable).   Echocardiogram An echocardiogram is a procedure that uses painless sound waves (ultrasound) to produce an image of the heart. Images from an echocardiogram can provide important information about:  Signs of coronary artery disease (CAD).  Aneurysm detection. An aneurysm is a weak or damaged part of an artery wall that bulges out from the normal force of blood pumping through the body.  Heart size and shape. Changes in the size or shape of the heart can be associated with certain conditions, including heart failure, aneurysm, and CAD.  Heart muscle function.  Heart valve function.  Signs of a past heart attack.  Fluid buildup around the heart.  Thickening of the heart muscle.  A tumor or infectious growth around the heart valves. Tell a health care provider about:  Any  allergies you have.  All medicines you are taking, including vitamins, herbs, eye drops, creams, and over-the-counter medicines.  Any blood disorders you have.  Any surgeries you have had.  Any medical conditions you have.  Whether you are pregnant or may be pregnant. What are the risks? Generally, this is a safe procedure. However, problems may occur, including:  Allergic reaction to dye (contrast) that may be used during the procedure. What happens before the procedure? No specific preparation is needed. You may eat and drink normally. What happens during the procedure?   An IV tube may be inserted into one of your veins.  You may receive contrast through this tube. A contrast is an injection that improves the quality of the pictures from your heart.  A gel will be applied to your chest.  A wand-like tool (transducer) will be moved over your chest. The gel will help to transmit the sound waves from the transducer.  The sound waves will harmlessly bounce off of your heart to allow the heart images to be captured in real-time motion. The images will be recorded on a computer. The procedure may vary among health care providers and hospitals. What happens after the procedure?  You may return to your normal, everyday life, including diet, activities, and medicines, unless your health care provider tells you not to do that. Summary  An echocardiogram is a procedure that uses painless sound waves (ultrasound) to produce an image of the heart.  Images from an echocardiogram can provide important information about the size and shape of your heart, heart muscle  function, heart valve function, and fluid buildup around your heart.  You do not need to do anything to prepare before this procedure. You may eat and drink normally.  After the echocardiogram is completed, you may return to your normal, everyday life, unless your health care provider tells you not to do that. This  information is not intended to replace advice given to you by your health care provider. Make sure you discuss any questions you have with your health care provider. Document Revised: 10/02/2018 Document Reviewed: 07/14/2016 Elsevier Patient Education  [place] Heart-healthy meal planning includes:  Eating less unhealthy fats.  Eating more healthy fats.  Making other changes in your diet. Talk with your doctor or a diet specialist (dietitian) to create an eating plan that is right for you. What is my plan? Your doctor may recommend an eating plan that includes:  Total fat: ______% or less of total calories a day.  Saturated fat: ______% or less of total calories a day.  Cholesterol: less than _________mg a day. What are tips for following this plan? Cooking Avoid frying your food. Try to bake, boil, grill, or broil it instead. You can also reduce fat by:  Removing the skin from poultry.  Removing all visible fats from meats.  Steaming vegetables in water or broth. Meal planning   At meals, divide your plate into four equal parts: ? Fill one-half of your plate with vegetables and green salads. ? Fill one-fourth of your plate with whole grains. ? Fill one-fourth of your plate with lean protein foods.  Eat 4-5 servings of vegetables per day. A serving of vegetables is: ? 1 cup of raw or cooked vegetables. ? 2 cups of raw leafy greens.  Eat 4-5 servings of fruit per day. A serving of fruit is: ? 1 medium whole fruit. ?  cup of dried fruit. ?  cup of fresh, frozen, or canned fruit. ?  cup of 100% fruit juice.  Eat more foods that have soluble fiber. These are apples, broccoli, carrots, beans, peas, and barley. Try to get 20-30 g of fiber per day.  Eat 4-5 servings of nuts, legumes, and seeds per week: ? 1 serving of dried beans or legumes equals  cup after being cooked. ? 1 serving of nuts is  cup. ? 1 serving of seeds  equals 1 tablespoon. General information  Eat more home-cooked food. Eat less restaurant, buffet, and fast food.  Limit or avoid alcohol.  Limit foods that are high in starch and sugar.  Avoid fried foods.  Lose weight if you are overweight.  Keep track of how much salt (sodium) you eat. This is important if you have high blood pressure. Ask your doctor to tell you more about this.  Try to add vegetarian meals each week. Fats  Choose healthy fats. These include olive oil and canola oil, flaxseeds, walnuts, almonds, and seeds.  Eat more omega-3 fats. These include salmon, mackerel, sardines, tuna, flaxseed oil, and ground flaxseeds. Try to eat fish at least 2 times each week.  Check food labels. Avoid foods with trans fats or high amounts of saturated fat.  Limit saturated fats. ? These are often found in animal products, such as meats, butter, and cream. ? These are also found in plant foods, such as palm oil, palm kernel oil, and coconut oil.  Avoid foods with partially hydrogenated oils in them. These have trans fats. Examples are stick margarine, some tub margarines,  cookies, crackers, and other baked goods. What foods can I eat? Fruits All fresh, canned (in natural juice), or frozen fruits. Vegetables Fresh or frozen vegetables (raw, steamed, roasted, or grilled). Green salads. Grains Most grains. Choose whole wheat and whole grains most of the time. Rice and pasta, including brown rice and pastas made with whole wheat. Meats and other proteins Lean, well-trimmed beef, veal, pork, and lamb. Chicken and [place] without skin. All fish and shellfish. Wild duck, rabbit, pheasant, and venison. Egg whites or low-cholesterol egg substitutes. Dried beans, peas, lentils, and tofu. Seeds and most nuts. Dairy Low-fat or nonfat cheeses, including ricotta and mozzarella. Skim or 1% milk that is liquid, powdered, or evaporated. Buttermilk that is made with low-fat milk. Nonfat or low-fat  yogurt. Fats and oils Non-hydrogenated (trans-free) margarines. Vegetable oils, including soybean, sesame, sunflower, olive, peanut, safflower, corn, canola, and cottonseed. Salad dressings or mayonnaise made with a vegetable oil. Beverages Mineral water. Coffee and tea. Diet carbonated beverages. Sweets and desserts Sherbet, gelatin, and fruit ice. Small amounts of dark chocolate. Limit all sweets and desserts. Seasonings and condiments All seasonings and condiments. The items listed above may not be a complete list of foods and drinks you can eat. Contact a dietitian for more options. What foods should I avoid? Fruits Canned fruit in heavy syrup. Fruit in cream or butter sauce. Fried fruit. Limit coconut. Vegetables Vegetables cooked in cheese, cream, or butter sauce. Fried vegetables. Grains Breads that are made with saturated or trans fats, oils, or whole milk. Croissants. Sweet rolls. Donuts. High-fat crackers, such as cheese crackers. Meats and other proteins Fatty meats, such as hot dogs, ribs, sausage, bacon, rib-eye roast or steak. High-fat deli meats, such as salami and bologna. Caviar. Domestic duck and goose. Organ meats, such as liver. Dairy Cream, sour cream, cream cheese, and creamed cottage cheese. Whole-milk cheeses. Whole or 2% milk that is liquid, evaporated, or condensed. Whole buttermilk. Cream sauce or high-fat cheese sauce. Yogurt that is made from whole milk. Fats and oils Meat fat, or shortening. Cocoa butter, hydrogenated oils, palm oil, coconut oil, palm kernel oil. Solid fats and shortenings, including bacon fat, salt pork, lard, and butter. Nondairy cream substitutes. Salad dressings with cheese or sour cream. Beverages Regular sodas and juice drinks with added sugar. Sweets and desserts Frosting. Pudding. Cookies. Cakes. Pies. Milk chocolate or white chocolate. Buttered syrups. Full-fat ice cream or ice cream drinks. The items listed above may not be a  complete list of foods and drinks to avoid. Contact a dietitian for more information. Summary  Heart-healthy meal planning includes eating less unhealthy fats, eating more healthy fats, and making other changes in your diet.  Eat a balanced diet. This includes fruits and vegetables, low-fat or nonfat dairy, lean protein, nuts and legumes, whole grains, and heart-healthy oils and fats. This information is not intended to replace advice given to you by your health care provider. Make sure you discuss any questions you have with your health care provider. Document Revised: 08/15/2017 Document Reviewed: 07/19/2017 Elsevier Patient Education  2020 [employer].

## 2019-07-07 NOTE — Progress Notes (Signed)
Cardiology Office Note   Date:  07/07/2019   ID:  Dennis Huffman, DOB Aug 17, 1950, MRN [id number]  PCP:  [name], MD    No chief complaint on file.  PACs  Wt Readings from Last 3 Encounters:  07/07/19 238 lb 6.4 oz (108.1 kg)  06/01/19 240 lb (108.9 kg)  04/24/19 242 lb (109.8 kg)       History of Present Illness: Dennis Huffman is a 69 y.o. male   who had arm pain and was given hydrocodone in 2014.  At that time, he he noticed some palpitations so he stopped the medicine.  The palpitations then resolved.   ECG was done with Dr. Lysle Rubens and his HR was slow, per his report 40 bpm.  Due to his age, he was referred here to be evaluated for bradycardia and was seen in 2015.  He had a negative w/u at that time.   He no longer exercises regularly.  He had a hip replacement in 2016, neck operation in 2016 and then a right shoulder operation Having some occasional skipped beats sensation.  It has decreased.   He avoids the pain meds now.    He had Prostate Cancer and had radiation. Testosterone was not recommended.    Denies :  Dizziness. Leg edema. Nitroglycerin use. Orthopnea. Palpitations. Paroxysmal nocturnal dyspnea. Shortness of breath. Syncope.  He feels a sensation in his chest.  It comes and goes.  No trigger for this.  It happens several times a week, can last up to 30 minutes.    No regular walking.  Not going to a gym due to COVID-19.    Past Medical History:  Diagnosis Date  . Arthritis    cervical spondylotic, radiculopathy  . Bradycardia    a. HR previously reported as 40 at PCP's office; event monitor 2017 showed nocturnal bradycardia but no daytime findings.  . CKD (chronic kidney disease), stage II   . Hypercholesteremia   . Hypertension   . Premature atrial contractions   . Prostate cancer ([place])   . PVC's (premature ventricular contractions)     Past Surgical History:  Procedure Laterality Date  . ANTERIOR CERVICAL DECOMP/DISCECTOMY FUSION  N/A 05/12/2015   Procedure: ANTERIOR CERVICAL DECOMPRESSION/DISCECTOMY FUSION C3 - C6 3 LEVELS;  Surgeon: Melina Schools, MD;  Location: [facility];  Service: Orthopedics;  Laterality: N/A;  . colonscopy    . COLONSCOPY     . INSERTION PROSTATE RADIATION SEED  UNSURE OF DATE   DONE BY DR Karsten Ro   . PROSTATE BIOPSY    . ROTATOR CUFF REPAIR Right 03-13-12; 09-27-15  . TOTAL HIP ARTHROPLASTY Left 11/28/2018   Procedure: TOTAL HIP ARTHROPLASTY;  Surgeon: Earlie Server, MD;  Location: [facility];  Service: Orthopedics;  Laterality: Left;     Current Outpatient Medications  Medication Sig Dispense Refill  . acetaminophen (TYLENOL) 650 MG CR tablet Take 650-1,300 mg by mouth every 8 (eight) hours as needed for pain.    [name] amLODipine (NORVASC) 5 MG tablet Take 5 mg by mouth every evening.    [name] atorvastatin (LIPITOR) 10 MG tablet Take 10 mg by mouth daily.    [name] bismuth subsalicylate (PEPTO BISMOL) 262 MG/15ML suspension Take 30 mLs by mouth every 6 (six) hours as needed for indigestion.    [name] FERROUS SULFATE PO Take 1 tablet by mouth daily.    . Glucosamine-Chondroitin-MSM [id number] MG TABS Take 3 tablets by mouth daily.    [name] losartan (COZAAR) 100 MG  tablet Take 100 mg by mouth daily.    . magnesium oxide (MAG-OX) 400 MG tablet Take 400 mg by mouth daily.    . Melatonin 10 MG CAPS Take 10-20 mg by mouth at bedtime.    . Multiple Vitamin (MULTIVITAMIN WITH MINERALS) TABS tablet Take 2 tablets by mouth daily.    . Probiotic CAPS Take 1 capsule by mouth daily.    . vitamin B-12 (CYANOCOBALAMIN) 1000 MCG tablet Take 1,000 mcg by mouth daily.     No current facility-administered medications for this visit.    Allergies:   Hydrocodone-acetaminophen and Hydrocodone    Social History:  The patient  reports that he quit smoking about 48 years ago. He has never used smokeless tobacco. He reports current alcohol use. He reports that he does not use drugs.   Family History:  The patient's family history includes  Cancer in his sister; Healthy in his father and mother; Stroke in his sister.    ROS:  Please see the history of present illness.   Otherwise, review of systems are positive for chest discomfort.   All other systems are reviewed and negative.    PHYSICAL EXAM: VS:  BP 138/64   Pulse 62   Ht 5\' 11"  (1.803 m)   Wt 238 lb 6.4 oz (108.1 kg)   SpO2 99%   BMI 33.25 kg/m  , BMI Body mass index is 33.25 kg/m. GEN: Well nourished, well developed, in no acute distress  HEENT: normal  Neck: no JVD, carotid bruits, or masses Cardiac: RRR; no murmurs, rubs, or gallops,no edema  Respiratory:  clear to auscultation bilaterally, normal work of breathing GI: soft, nontender, nondistended, + BS MS: no deformity or atrophy  Skin: warm and dry, no rash Neuro:  Strength and sensation are intact Psych: euthymic mood, full affect   EKG:   The ekg ordered 04/24/2019 demonstrates NSR, no ST changes   Recent Labs: 11/29/2018: BUN 18; Creatinine, Ser 1.00; Hemoglobin 10.5; Platelets 157; Potassium 4.4; Sodium 135   Lipid Panel No results found for: CHOL, TRIG, HDL, CHOLHDL, VLDL, LDLCALC, LDLDIRECT   Other studies Reviewed: Additional studies/ records that were reviewed today with results demonstrating: .   ASSESSMENT AND PLAN:  1. PACs: Stable. 2. Hyperlipidemia: LDL 83 in 11/2018.  COntinue atorvastatin. 3. HTN: The current medical regimen is effective;  continue present plan and medications. 4. Chest discomfort: Vague.  Atypical.  Will check echo to eval for structural heart disease.  He is planning on getting back to regular exercise, which he stopped due to [place].   If he has exertional or persistent sx, would consider coronary CTA.    Current medicines are reviewed at length with the patient today.  The patient concerns regarding his medicines were addressed.  The following changes have been made:  No change  Labs/ tests ordered today include:  No orders of the defined types were placed  in this encounter.   Recommend 150 minutes/week of aerobic exercise Low fat, low carb, high fiber diet recommended  Disposition:   FU for echo   Signed, Larae Grooms, MD  07/07/2019 2:13 PM    [facility] [address] [place], [place], [place]  [address] Phone: [phone]; Fax: [phone]

## 2019-07-15 ENCOUNTER — Other Ambulatory Visit: Payer: Self-pay

## 2019-07-15 ENCOUNTER — Ambulatory Visit (HOSPITAL_COMMUNITY): Payer: Medicare HMO | Attending: Cardiovascular Disease

## 2019-07-15 DIAGNOSIS — R0789 Other chest pain: Secondary | ICD-10-CM | POA: Diagnosis not present

## 2019-07-20 ENCOUNTER — Telehealth: Payer: Self-pay | Admitting: Interventional Cardiology

## 2019-07-20 DIAGNOSIS — I712 Thoracic aortic aneurysm, without rupture, unspecified: Secondary | ICD-10-CM

## 2019-07-20 NOTE — Telephone Encounter (Signed)
Patient returned call for his ECHO resutls.

## 2019-07-20 NOTE — Telephone Encounter (Signed)
The patient has been notified of the result and verbalized understanding.  All questions (if any) were answered. Orders placed for chest CTA.  Antonieta Iba, RN 07/20/2019 11:07 AM

## 2019-07-20 NOTE — Addendum Note (Signed)
Addended by: Antonieta Iba on: 07/20/2019 04:13 PM   Modules accepted: Orders

## 2019-07-22 DIAGNOSIS — H43393 Other vitreous opacities, bilateral: Secondary | ICD-10-CM | POA: Diagnosis not present

## 2019-07-22 DIAGNOSIS — H40033 Anatomical narrow angle, bilateral: Secondary | ICD-10-CM | POA: Diagnosis not present

## 2019-07-30 DIAGNOSIS — M545 Low back pain: Secondary | ICD-10-CM | POA: Diagnosis not present

## 2019-08-01 DIAGNOSIS — M1612 Unilateral primary osteoarthritis, left hip: Secondary | ICD-10-CM | POA: Diagnosis not present

## 2019-08-17 DIAGNOSIS — R252 Cramp and spasm: Secondary | ICD-10-CM | POA: Diagnosis not present

## 2019-08-17 DIAGNOSIS — S76319A Strain of muscle, fascia and tendon of the posterior muscle group at thigh level, unspecified thigh, initial encounter: Secondary | ICD-10-CM | POA: Diagnosis not present

## 2019-08-28 DIAGNOSIS — H833X3 Noise effects on inner ear, bilateral: Secondary | ICD-10-CM | POA: Diagnosis not present

## 2019-08-28 DIAGNOSIS — H9113 Presbycusis, bilateral: Secondary | ICD-10-CM | POA: Diagnosis not present

## 2019-08-28 DIAGNOSIS — H9313 Tinnitus, bilateral: Secondary | ICD-10-CM | POA: Diagnosis not present

## 2019-08-29 DIAGNOSIS — M1612 Unilateral primary osteoarthritis, left hip: Secondary | ICD-10-CM | POA: Diagnosis not present

## 2019-09-08 DIAGNOSIS — C61 Malignant neoplasm of prostate: Secondary | ICD-10-CM | POA: Diagnosis not present

## 2019-09-28 DIAGNOSIS — I10 Essential (primary) hypertension: Secondary | ICD-10-CM | POA: Diagnosis not present

## 2019-09-29 DIAGNOSIS — M1612 Unilateral primary osteoarthritis, left hip: Secondary | ICD-10-CM | POA: Diagnosis not present

## 2019-09-29 NOTE — Progress Notes (Signed)
PATIENT: [name] DOB: July 04, 1950  REASON FOR VISIT: follow up HISTORY FROM: patient  Chief Complaint  Patient presents with  . Follow-up    back rm, insomnia     HISTORY OF PRESENT ILLNESS: Today 09/30/19 [name] is a 69 y.o. male here today for follow up of subjective memory complaints. Wellbutrin was prescribed in 05/2019 due to suspicion of underlying depression and lack of concentration. MRI was unremarkable. He reports that he read the side effects associated with Wellbutrin and decided against taking it. He does continue to have concerns of memory loss and insomnia but feels symptoms are unchanged from last visit. He ordered a medication online (unsure of name) to help with his memory but does not feel that it has helped much at all. He goes to bed around 11p. He usually wakes up around 2am and around 5am. He usually gets out of bed around 9am. He does watch TV until he falls asleep. He is using melatonin at home and feels it helps. He does not snore. He denies excessive daytime sleepiness or dry mouth.    HISTORY: (copied from Dr Garth Bigness note on 06/01/2019)  I had the pleasure of seeing your patient, Dennis Huffman, at [facility] for neurologic consultation regarding his memory loss.  He is a 69 year old man who has noticed difficulties with his memory over the past couple months.    The onset was gradual over this time.  Of note, he notes memory problems more than those around him.    He recalls a few examples.   He did not remember some appointments.    He is driving well and has not been lost.    If he gets a hint, he will remember,   He feels he is focusing worse than he used to.    He denies much depression but does note mild apathy compared to earlier this year.[name]  He tries to get outdoors some but is not going to the gym as he used to.     He served in [place] and denies any PTSD symptoms.      He is sleeping more poorly.   He  typically goes to bed at 11 pm but wakes up around 2 am and then has trouble getting back to sleep.  Melatonin has helped some.   He denies being told that he snores.      He has had thyroid tests and B12 tests.  B12 was 317 and TSH 1.07.  He notes some tingling in the left arm but no numbness in feet.   Gait is unchanged.     He has a h/o of prostate cancer, treated with radiation.     He has a 9th grade education.   Montreal Cognitive Assessment  06/01/2019  Visuospatial/ Executive (0/5) 4  Naming (0/3) 3  Attention: Read list of digits (0/2) 2  Attention: Read list of letters (0/1) 1  Attention: Serial 7 subtraction starting at 100 (0/3) 3  Language: Repeat phrase (0/2) 2  Language : Fluency (0/1) 0  Abstraction (0/2) 0  Delayed Recall (0/5) 0  Orientation (0/6) 5  Total 20  Adjusted Score (based on education) 21   (Serial threes were substituted for serial sevens due to education level)    REVIEW OF SYSTEMS: Out of a complete 14 system review of symptoms, the patient complains only of the following symptoms, and all other reviewed systems are negative.  ALLERGIES: Allergies  Allergen Reactions  . Hydrocodone-Acetaminophen Other (See Comments)  . Hydrocodone Palpitations    HOME MEDICATIONS: Outpatient Medications Prior to Visit  Medication Sig Dispense Refill  . acetaminophen (TYLENOL) 650 MG CR tablet Take 650-1,300 mg by mouth every 8 (eight) hours as needed for pain.    [name] amLODipine (NORVASC) 5 MG tablet Take 5 mg by mouth every evening.    [name] atorvastatin (LIPITOR) 10 MG tablet Take 10 mg by mouth daily.    [name] bismuth subsalicylate (PEPTO BISMOL) 262 MG/15ML suspension Take 30 mLs by mouth every 6 (six) hours as needed for indigestion.    [name] FERROUS SULFATE PO Take 1 tablet by mouth daily.    . Glucosamine-Chondroitin-MSM [id number] MG TABS Take 3 tablets by mouth daily.    [name] losartan (COZAAR) 100 MG tablet Take 100 mg by mouth daily.    . magnesium oxide  (MAG-OX) 400 MG tablet Take 400 mg by mouth daily.    . Melatonin 10 MG CAPS Take 10-20 mg by mouth at bedtime.    . Multiple Vitamin (MULTIVITAMIN WITH MINERALS) TABS tablet Take 2 tablets by mouth daily.    . Probiotic CAPS Take 1 capsule by mouth daily.    . vitamin B-12 (CYANOCOBALAMIN) 1000 MCG tablet Take 1,000 mcg by mouth daily.     No facility-administered medications prior to visit.    PAST MEDICAL HISTORY: Past Medical History:  Diagnosis Date  . Arthritis    cervical spondylotic, radiculopathy  . Bradycardia    a. HR previously reported as 40 at PCP's office; event monitor 2017 showed nocturnal bradycardia but no daytime findings.  . CKD (chronic kidney disease), stage II   . Hypercholesteremia   . Hypertension   . Premature atrial contractions   . Prostate cancer ([place])   . PVC's (premature ventricular contractions)     PAST SURGICAL HISTORY: Past Surgical History:  Procedure Laterality Date  . ANTERIOR CERVICAL DECOMP/DISCECTOMY FUSION N/A 05/12/2015   Procedure: ANTERIOR CERVICAL DECOMPRESSION/DISCECTOMY FUSION C3 - C6 3 LEVELS;  Surgeon: Melina Schools, MD;  Location: [facility];  Service: Orthopedics;  Laterality: N/A;  . colonscopy    . COLONSCOPY     . INSERTION PROSTATE RADIATION SEED  UNSURE OF DATE   DONE BY DR Karsten Ro   . PROSTATE BIOPSY    . ROTATOR CUFF REPAIR Right 03-13-12; 09-27-15  . TOTAL HIP ARTHROPLASTY Left 11/28/2018   Procedure: TOTAL HIP ARTHROPLASTY;  Surgeon: Earlie Server, MD;  Location: [facility];  Service: Orthopedics;  Laterality: Left;    FAMILY HISTORY: Family History  Problem Relation Age of Onset  . Healthy Mother   . Healthy Father   . Stroke Sister   . Cancer Sister        breast    SOCIAL HISTORY: Social History   Socioeconomic History  . Marital status: Single    Spouse name: Not on file  . Number of children: Not on file  . Years of education: Not on file  . Highest education level: Not on file  Occupational History  .  Not on file  Tobacco Use  . Smoking status: Former Smoker    Quit date: 06/26/1971    Years since quitting: 48.2  . Smokeless tobacco: Never Used  Substance and Sexual Activity  . Alcohol use: Yes    Comment: socially   . Drug use: No  . Sexual activity: Yes  Other Topics Concern  . Not on file  Social History Narrative   Right handed  Caffeine use: 1 cup coffee per day   Soda- not on a regular basis   Lives with mother, sister, brother, and nephew.   Social Determinants of Health   Financial Resource Strain:   . Difficulty of Paying Living Expenses:   Food Insecurity:   . Worried About Charity fundraiser in the Last Year:   . Arboriculturist in the Last Year:   Transportation Needs:   . Film/video editor (Medical):   [name] Lack of Transportation (Non-Medical):   Physical Activity:   . Days of Exercise per Week:   . Minutes of Exercise per Session:   Stress:   . Feeling of Stress :   Social Connections:   . Frequency of Communication with Friends and Family:   . Frequency of Social Gatherings with Friends and Family:   . Attends Religious Services:   . Active Member of Clubs or Organizations:   . Attends Archivist Meetings:   [name] Marital Status:   Intimate Partner Violence:   . Fear of Current or Ex-Partner:   . Emotionally Abused:   [name] Physically Abused:   . Sexually Abused:       PHYSICAL EXAM  Vitals:   09/30/19 0920  BP: 136/78  Pulse: (!) 56  Temp: (!) 97.3 F (36.3 C)  Weight: 245 lb (111.1 kg)  Height: 5\' 11"  (1.803 m)   Body mass index is 34.17 kg/m.  Generalized: Well developed, in no acute distress  Cardiology: normal rate and rhythm, no murmur noted Respiratory: clear to auscultation bilaterally  Neurological examination  Mentation: Alert oriented to time, place, history taking. Follows all commands speech and language fluent Cranial nerve II-XII: Pupils were equal round reactive to light. Extraocular movements were full,  visual field were full  Motor: The motor testing reveals 5 over 5 strength of all 4 extremities. Good symmetric motor tone is noted throughout.  Gait and station: Gait is normal.  DIAGNOSTIC DATA (LABS, IMAGING, TESTING) - I reviewed patient records, labs, notes, testing and imaging myself where available.  No flowsheet data found.   Lab Results  Component Value Date   WBC 12.7 (H) 11/29/2018   HGB 10.5 (L) 11/29/2018   HCT 30.5 (L) 11/29/2018   MCV 81.1 11/29/2018   PLT 157 11/29/2018      Component Value Date/Time   NA 135 11/29/2018 0417   K 4.4 11/29/2018 0417   CL 104 11/29/2018 0417   CO2 22 11/29/2018 0417   GLUCOSE 144 (H) 11/29/2018 0417   BUN 18 11/29/2018 0417   CREATININE 1.00 11/29/2018 0417   CALCIUM 9.3 11/29/2018 0417   PROT 7.5 08/06/2014 2318   ALBUMIN 4.2 08/06/2014 2318   AST 31 08/06/2014 2318   ALT 27 08/06/2014 2318   ALKPHOS 42 08/06/2014 2318   BILITOT 2.0 (H) 08/06/2014 2318   GFRNONAA >60 11/29/2018 0417   GFRAA >60 11/29/2018 0417   No results found for: CHOL, HDL, LDLCALC, LDLDIRECT, TRIG, CHOLHDL No results found for: HGBA1C No results found for: VITAMINB12 No results found for: TSH   Montreal Cognitive Assessment  09/30/2019 06/01/2019  Visuospatial/ Executive (0/5) 5 4  Naming (0/3) 2 3  Attention: Read list of digits (0/2) 2 2  Attention: Read list of letters (0/1) 1 1  Attention: Serial 7 subtraction starting at 100 (0/3) 1 3  Language: Repeat phrase (0/2) 2 2  Language : Fluency (0/1) 1 0  Abstraction (0/2) 2 0  Delayed Recall (0/5)  0 0  Orientation (0/6) 5 5  Total 21 20  Adjusted Score (based on education) 59 21     ASSESSMENT AND PLAN 69 y.o. year old male  has a past medical history of Arthritis, Bradycardia, CKD (chronic kidney disease), stage II, Hypercholesteremia, Hypertension, Premature atrial contractions, Prostate cancer ([place]), and PVC's (premature ventricular contractions). here with     ICD-10-CM   1. Memory  loss  R41.3   2. Psychophysiological insomnia  F51.04     Mr Polyakov feels that although he continues to have trouble remembering names or appointments as well as difficulty staying asleep at night, symptoms are unchanged from 05/2019. We have reviewed MRI results. We have discussed trying a different medication but he is very hesitant as he does not want to take a prescription medication long term. We have discussed memory compensation strategies as well as trying valerian root, OTC. He is more comfortable with this plan. He was encouraged to stay well hydrated, focus on eating a healthy well balanced diet and getting regular physical and mental exercise. He will follow up closely with PCP as directed. He will return to see [place] as needed. He verbalizes understanding and agreement with this plan.    No orders of the defined types were placed in this encounter.    No orders of the defined types were placed in this encounter.     I spent 15 minutes with the patient. 50% of this time was spent counseling and educating patient on plan of care and medications.    Debbora Presto, FNP-C 09/30/2019, 10:09 AM [facility] [address] [place], [place] [address] [phone]

## 2019-09-30 ENCOUNTER — Other Ambulatory Visit: Payer: Self-pay

## 2019-09-30 ENCOUNTER — Encounter: Payer: Self-pay | Admitting: Family Medicine

## 2019-09-30 ENCOUNTER — Ambulatory Visit: Payer: Medicare HMO | Admitting: Family Medicine

## 2019-09-30 VITALS — BP 136/78 | HR 56 | Temp 97.3°F | Ht 71.0 in | Wt 245.0 lb

## 2019-09-30 DIAGNOSIS — R413 Other amnesia: Secondary | ICD-10-CM | POA: Diagnosis not present

## 2019-09-30 DIAGNOSIS — F5104 Psychophysiologic insomnia: Secondary | ICD-10-CM

## 2019-09-30 NOTE — Patient Instructions (Signed)
We will monitor symptoms for now. I recommend looking into valerian root over the counter.   Use memory compensation strategies, stay well hydrated and work on healthy lifestyle habits with well balanced diet and regular exercise.   Follow up with PCP as directed   Follow up with [place] as needed    Valerian, Valeriana officinalis oral dosage forms What is this medicine? VALERIAN (vuh LEER ee uhn) is an herbal or dietary supplement. It is promoted to help relaxation, sleep and stress. The FDA has not approved this supplement for any medical use. This supplement may be used for other purposes; ask your health care provider or pharmacist if you have questions. This medicine may be used for other purposes; ask your health care provider or pharmacist if you have questions. What should I tell my health care provider before I take this medicine? They need to know if you have any of these conditions:  drug abuse or addiction  emotional illness like anxiety, depression  heart disease  kidney disease  liver disease  if you frequently drink alcohol containing drinks  sleeping problems  an unusual or allergic reaction to valerian, herbs, plants, other medicines, foods, dyes, or preservatives  pregnant or trying to get pregnant  breast-feeding How should I use this medicine? Take this supplement by mouth with a glass of water. Follow the directions on the package labeling, or take as directed by your health care professional. If this supplement upsets your stomach, take it with food. Do not take this supplement more often than directed. Contact your pediatrician regarding the use of this supplement in children. Special care may be needed. Overdosage: If you think you have taken too much of this medicine contact a poison control center or emergency room at once. NOTE: This medicine is only for you. Do not share this medicine with others. What if I miss a dose? If you miss a dose, take it  as soon as you can. If it is almost time for your next dose, take only that dose. Do not take double or extra doses. What may interact with this medicine? Check with your doctor or healthcare professional if you are taking any of the following medications:  alcohol  barbiturate medicines for sleep or seizures  medicines for depression, anxiety, or psychotic disturbances  medicines for sleep  muscle relaxants  narcotic pain medicines This list may not describe all possible interactions. Give your health care provider a list of all the medicines, herbs, non-prescription drugs, or dietary supplements you use. Also tell them if you smoke, drink alcohol, or use illegal drugs. Some items may interact with your medicine. What should I watch for while using this medicine? See your doctor if your symptoms do not get better or if they get worse. You may get drowsy or dizzy. Do not drive, use machinery, or do anything that needs mental alertness until you know how this medicine affects you. Do not stand or sit up quickly, especially if you are an older patient. This reduces the risk of dizzy or fainting spells. Alcohol may interfere with the effect of this medicine. If you are scheduled for any medical or dental procedure, tell your healthcare provider that you are taking this supplement. You may need to stop taking this supplement before the procedure. Herbal or dietary supplements are not regulated like medicines. Rigid quality control standards are not required for dietary supplements. The purity and strength of these products can vary. The safety and effect of this  dietary supplement for a certain disease or illness is not well known. This product is not intended to diagnose, treat, cure or prevent any disease. The Food and Drug Administration suggests the following to help consumers protect themselves:  Always read product labels and follow directions.  Natural does not mean a product is safe for  humans to take.  Look for products that include USP after the ingredient name. This means that the manufacturer followed the standards of the [place] Pharmacopoeia.  Supplements made or sold by a nationally known food or drug company are more likely to be made under tight controls. You can write to the company for more information about how the product was made. What side effects may I notice from receiving this medicine? Side effects that you should report to your doctor or health care professional as soon as possible:  allergic reactions like skin rash, itching or hives, swelling of the face, lips, or tongue  breathing problems  changes in emotions or moods, like depressed mood  confused, forgetful  dark urine  fast, irregular heartbeat  problems with balance, talking, walking  unusually weak or tired  yellowing of the eyes, skin Side effects that usually do not require medical attention (report to your doctor or health care professional if they continue or are bothersome):  stomach upset  dizziness  tiredness This list may not describe all possible side effects. Call your doctor for medical advice about side effects. You may report side effects to FDA at 1-800-FDA-1088. Where should I keep my medicine? Keep out of the reach of children. Store at room temperature or as directed on the package label. Protect from moisture. Throw away any unused supplement after the expiration date. NOTE: This sheet is a summary. It may not cover all possible information. If you have questions about this medicine, talk to your doctor, pharmacist, or health care provider.  2020 Elsevier/Gold Standard (2008-03-08 17:33:52)   Insomnia Insomnia is a sleep disorder that makes it difficult to fall asleep or stay asleep. Insomnia can cause fatigue, low energy, difficulty concentrating, mood swings, and poor performance at work or school. There are three different ways to classify insomnia:   Difficulty falling asleep.  Difficulty staying asleep.  Waking up too early in the morning. Any type of insomnia can be long-term (chronic) or short-term (acute). Both are common. Short-term insomnia usually lasts for three months or less. Chronic insomnia occurs at least three times a week for longer than three months. What are the causes? Insomnia may be caused by another condition, situation, or substance, such as:  Anxiety.  Certain medicines.  Gastroesophageal reflux disease (GERD) or other gastrointestinal conditions.  Asthma or other breathing conditions.  Restless legs syndrome, sleep apnea, or other sleep disorders.  Chronic pain.  Menopause.  Stroke.  Abuse of alcohol, tobacco, or illegal drugs.  Mental health conditions, such as depression.  Caffeine.  Neurological disorders, such as Alzheimer's disease.  An overactive thyroid (hyperthyroidism). Sometimes, the cause of insomnia may not be known. What increases the risk? Risk factors for insomnia include:  Gender. Women are affected more often than men.  Age. Insomnia is more common as you get older.  Stress.  Lack of exercise.  Irregular work schedule or working night shifts.  Traveling between different time zones.  Certain medical and mental health conditions. What are the signs or symptoms? If you have insomnia, the main symptom is having trouble falling asleep or having trouble staying asleep. This may lead to  other symptoms, such as:  Feeling fatigued or having low energy.  Feeling nervous about going to sleep.  Not feeling rested in the morning.  Having trouble concentrating.  Feeling irritable, anxious, or depressed. How is this diagnosed? This condition may be diagnosed based on:  Your symptoms and medical history. Your health care provider may ask about: ? Your sleep habits. ? Any medical conditions you have. ? Your mental health.  A physical exam. How is this treated?  Treatment for insomnia depends on the cause. Treatment may focus on treating an underlying condition that is causing insomnia. Treatment may also include:  Medicines to help you sleep.  Counseling or therapy.  Lifestyle adjustments to help you sleep better. Follow these instructions at home: Eating and drinking   Limit or avoid alcohol, caffeinated beverages, and cigarettes, especially close to bedtime. These can disrupt your sleep.  Do not eat a large meal or eat spicy foods right before bedtime. This can lead to digestive discomfort that can make it hard for you to sleep. Sleep habits   Keep a sleep diary to help you and your health care provider figure out what could be causing your insomnia. Write down: ? When you sleep. ? When you wake up during the night. ? How well you sleep. ? How rested you feel the next day. ? Any side effects of medicines you are taking. ? What you eat and drink.  Make your bedroom a dark, comfortable place where it is easy to fall asleep. ? Put up shades or blackout curtains to block light from outside. ? Use a white noise machine to block noise. ? Keep the temperature cool.  Limit screen use before bedtime. This includes: ? Watching TV. ? Using your smartphone, tablet, or computer.  Stick to a routine that includes going to bed and waking up at the same times every day and night. This can help you fall asleep faster. Consider making a quiet activity, such as reading, part of your nighttime routine.  Try to avoid taking naps during the day so that you sleep better at night.  Get out of bed if you are still awake after 15 minutes of trying to sleep. Keep the lights down, but try reading or doing a quiet activity. When you feel sleepy, go back to bed. General instructions  Take over-the-counter and prescription medicines only as told by your health care provider.  Exercise regularly, as told by your health care provider. Avoid exercise starting  several hours before bedtime.  Use relaxation techniques to manage stress. Ask your health care provider to suggest some techniques that may work well for you. These may include: ? Breathing exercises. ? Routines to release muscle tension. ? Visualizing peaceful scenes.  Make sure that you drive carefully. Avoid driving if you feel very sleepy.  Keep all follow-up visits as told by your health care provider. This is important. Contact a health care provider if:  You are tired throughout the day.  You have trouble in your daily routine due to sleepiness.  You continue to have sleep problems, or your sleep problems get worse. Get help right away if:  You have serious thoughts about hurting yourself or someone else. If you ever feel like you may hurt yourself or others, or have thoughts about taking your own life, get help right away. You can go to your nearest emergency department or call:  Your local emergency services (911 in the [place]  A suicide crisis helpline, such  as the [facility] at (234)055-7695. This is open 24 hours a day. Summary  Insomnia is a sleep disorder that makes it difficult to fall asleep or stay asleep.  Insomnia can be long-term (chronic) or short-term (acute).  Treatment for insomnia depends on the cause. Treatment may focus on treating an underlying condition that is causing insomnia.  Keep a sleep diary to help you and your health care provider figure out what could be causing your insomnia. This information is not intended to replace advice given to you by your health care provider. Make sure you discuss any questions you have with your health care provider. Document Revised: 05/24/2017 Document Reviewed: 03/21/2017 Elsevier Patient Education  [place].   Memory Compensation Strategies  1. Use "WARM" strategy.  W= write it down  A= associate it  R= repeat it  M= make a mental note  2.   You can keep a Armed forces logistics/support/administrative officer.  Use a 3-ring notebook with sections for the following: calendar, important names and phone numbers,  medications, doctors' names/phone numbers, lists/reminders, and a section to journal what you did  each day.   3.    Use a calendar to write appointments down.  4.    Write yourself a schedule for the day.  This can be placed on the calendar or in a separate section of the Memory Notebook.  Keeping a  regular schedule can help memory.  5.    Use medication organizer with sections for each day or morning/evening pills.  You may need help loading it  6.    Keep a basket, or pegboard by the door.  Place items that you need to take out with you in the basket or on the pegboard.  You may also want to  include a message board for reminders.  7.    Use sticky notes.  Place sticky notes with reminders in a place where the task is performed.  For example: " turn off the  stove" placed by the stove, "lock the door" placed on the door at eye level, " take your medications" on  the bathroom mirror or by the place where you normally take your medications.  8.    Use alarms/timers.  Use while cooking to remind yourself to check on food or as a reminder to take your medicine, or as a  reminder to make a call, or as a reminder to perform another task, etc.

## 2019-09-30 NOTE — Progress Notes (Signed)
I have read the note, and I agree with the clinical assessment and plan.  Hillard Goodwine A. Zebulun Deman, MD, PhD, FAAN Certified in Neurology, Clinical Neurophysiology, Sleep Medicine, Pain Medicine and Neuroimaging  [facility] [address], [address] [place], [place] [address] [phone]  

## 2019-10-29 DIAGNOSIS — M1612 Unilateral primary osteoarthritis, left hip: Secondary | ICD-10-CM | POA: Diagnosis not present

## 2019-11-10 DIAGNOSIS — I10 Essential (primary) hypertension: Secondary | ICD-10-CM | POA: Diagnosis not present

## 2019-11-29 DIAGNOSIS — M1612 Unilateral primary osteoarthritis, left hip: Secondary | ICD-10-CM | POA: Diagnosis not present

## 2019-12-16 DIAGNOSIS — I493 Ventricular premature depolarization: Secondary | ICD-10-CM | POA: Diagnosis not present

## 2019-12-16 DIAGNOSIS — R002 Palpitations: Secondary | ICD-10-CM | POA: Diagnosis not present

## 2019-12-22 DIAGNOSIS — R002 Palpitations: Secondary | ICD-10-CM | POA: Diagnosis not present

## 2019-12-25 DIAGNOSIS — Z8546 Personal history of malignant neoplasm of prostate: Secondary | ICD-10-CM | POA: Diagnosis not present

## 2019-12-25 DIAGNOSIS — N182 Chronic kidney disease, stage 2 (mild): Secondary | ICD-10-CM | POA: Diagnosis not present

## 2019-12-25 DIAGNOSIS — E7800 Pure hypercholesterolemia, unspecified: Secondary | ICD-10-CM | POA: Diagnosis not present

## 2019-12-25 DIAGNOSIS — I700 Atherosclerosis of aorta: Secondary | ICD-10-CM | POA: Diagnosis not present

## 2019-12-25 DIAGNOSIS — I10 Essential (primary) hypertension: Secondary | ICD-10-CM | POA: Diagnosis not present

## 2019-12-25 DIAGNOSIS — Z0000 Encounter for general adult medical examination without abnormal findings: Secondary | ICD-10-CM | POA: Diagnosis not present

## 2019-12-25 DIAGNOSIS — Z1389 Encounter for screening for other disorder: Secondary | ICD-10-CM | POA: Diagnosis not present

## 2019-12-25 DIAGNOSIS — M169 Osteoarthritis of hip, unspecified: Secondary | ICD-10-CM | POA: Diagnosis not present

## 2019-12-25 DIAGNOSIS — R7303 Prediabetes: Secondary | ICD-10-CM | POA: Diagnosis not present

## 2019-12-25 DIAGNOSIS — R7309 Other abnormal glucose: Secondary | ICD-10-CM | POA: Diagnosis not present

## 2019-12-29 DIAGNOSIS — R5383 Other fatigue: Secondary | ICD-10-CM | POA: Diagnosis not present

## 2019-12-30 DIAGNOSIS — R002 Palpitations: Secondary | ICD-10-CM | POA: Diagnosis not present

## 2020-01-01 ENCOUNTER — Other Ambulatory Visit: Payer: Self-pay

## 2020-01-01 ENCOUNTER — Emergency Department (HOSPITAL_COMMUNITY)
Admission: EM | Admit: 2020-01-01 | Discharge: 2020-01-01 | Disposition: A | Discharge status: Home / Self Care | Payer: Medicare HMO | Attending: Emergency Medicine | Admitting: Emergency Medicine

## 2020-01-01 ENCOUNTER — Emergency Department (HOSPITAL_COMMUNITY): Payer: Medicare HMO

## 2020-01-01 ENCOUNTER — Other Ambulatory Visit: Payer: Medicare HMO | Admitting: *Deleted

## 2020-01-01 DIAGNOSIS — R002 Palpitations: Secondary | ICD-10-CM | POA: Insufficient documentation

## 2020-01-01 DIAGNOSIS — N182 Chronic kidney disease, stage 2 (mild): Secondary | ICD-10-CM | POA: Diagnosis not present

## 2020-01-01 DIAGNOSIS — Z79899 Other long term (current) drug therapy: Secondary | ICD-10-CM | POA: Diagnosis not present

## 2020-01-01 DIAGNOSIS — I712 Thoracic aortic aneurysm, without rupture, unspecified: Secondary | ICD-10-CM

## 2020-01-01 DIAGNOSIS — Z96642 Presence of left artificial hip joint: Secondary | ICD-10-CM | POA: Diagnosis not present

## 2020-01-01 DIAGNOSIS — Z8546 Personal history of malignant neoplasm of prostate: Secondary | ICD-10-CM | POA: Insufficient documentation

## 2020-01-01 DIAGNOSIS — R5383 Other fatigue: Secondary | ICD-10-CM | POA: Diagnosis not present

## 2020-01-01 DIAGNOSIS — I129 Hypertensive chronic kidney disease with stage 1 through stage 4 chronic kidney disease, or unspecified chronic kidney disease: Secondary | ICD-10-CM | POA: Diagnosis not present

## 2020-01-01 DIAGNOSIS — Z87891 Personal history of nicotine dependence: Secondary | ICD-10-CM | POA: Insufficient documentation

## 2020-01-01 LAB — BASIC METABOLIC PANEL
BUN/Creatinine Ratio: 8 — ABNORMAL LOW (ref 10–24)
BUN: 11 mg/dL (ref 8–27)
CO2: 22 mmol/L (ref 20–29)
Calcium: 9.8 mg/dL (ref 8.6–10.2)
Chloride: 104 mmol/L (ref 96–106)
Creatinine, Ser: 1.41 mg/dL — ABNORMAL HIGH (ref 0.76–1.27)
GFR calc Af Amer: 58 mL/min/{1.73_m2} — ABNORMAL LOW (ref >59)
GFR calc non Af Amer: 50 mL/min/{1.73_m2} — ABNORMAL LOW (ref >59)
Glucose: 129 mg/dL — ABNORMAL HIGH (ref 65–99)
Potassium: 4.1 mmol/L (ref 3.5–5.2)
Sodium: 141 mmol/L (ref 134–144)

## 2020-01-01 LAB — CBC WITH DIFFERENTIAL/PLATELET
Abs Immature Granulocytes: 0.01 10*3/uL (ref 0.00–0.07)
Basophils Absolute: 0 10*3/uL (ref 0.0–0.1)
Basophils Relative: 1 %
Eosinophils Absolute: 0.1 10*3/uL (ref 0.0–0.5)
Eosinophils Relative: 2 %
HCT: 33.9 % — ABNORMAL LOW (ref 39.0–52.0)
Hemoglobin: 12 g/dL — ABNORMAL LOW (ref 13.0–17.0)
Immature Granulocytes: 0 %
Lymphocytes Relative: 31 %
Lymphs Abs: 1.5 10*3/uL (ref 0.7–4.0)
MCH: 28.6 pg (ref 26.0–34.0)
MCHC: 35.4 g/dL (ref 30.0–36.0)
MCV: 80.7 fL (ref 80.0–100.0)
Monocytes Absolute: 0.4 10*3/uL (ref 0.1–1.0)
Monocytes Relative: 9 %
Neutro Abs: 2.7 10*3/uL (ref 1.7–7.7)
Neutrophils Relative %: 57 %
Platelets: 176 10*3/uL (ref 150–400)
RBC: 4.2 MIL/uL — ABNORMAL LOW (ref 4.22–5.81)
RDW: 13.9 % (ref 11.5–15.5)
WBC: 4.8 10*3/uL (ref 4.0–10.5)
nRBC: 0 % (ref 0.0–0.2)

## 2020-01-01 LAB — COMPREHENSIVE METABOLIC PANEL
ALT: 29 U/L (ref 0–44)
AST: 24 U/L (ref 15–41)
Albumin: 4.1 g/dL (ref 3.5–5.0)
Alkaline Phosphatase: 51 U/L (ref 38–126)
Anion gap: 8 (ref 5–15)
BUN: 11 mg/dL (ref 8–23)
CO2: 26 mmol/L (ref 22–32)
Calcium: 9.6 mg/dL (ref 8.9–10.3)
Chloride: 107 mmol/L (ref 98–111)
Creatinine, Ser: 1.41 mg/dL — ABNORMAL HIGH (ref 0.61–1.24)
GFR calc Af Amer: 58 mL/min — ABNORMAL LOW (ref >60)
GFR calc non Af Amer: 50 mL/min — ABNORMAL LOW (ref >60)
Glucose, Bld: 104 mg/dL — ABNORMAL HIGH (ref 70–99)
Potassium: 4.3 mmol/L (ref 3.5–5.1)
Sodium: 141 mmol/L (ref 135–145)
Total Bilirubin: 1.3 mg/dL — ABNORMAL HIGH (ref 0.3–1.2)
Total Protein: 6.6 g/dL (ref 6.5–8.1)

## 2020-01-01 LAB — TROPONIN I (HIGH SENSITIVITY): Troponin I (High Sensitivity): 3 ng/L (ref <18)

## 2020-01-01 NOTE — ED Provider Notes (Signed)
[facility] EMERGENCY DEPARTMENT Provider Note   CSN: [id number] Arrival date & time: 01/01/20  1004     History No chief complaint on file.   Dennis Huffman is a 69 y.o. male.  Patient complains of palpitations.  Is been going on for a few days  The history is provided by the patient and medical records. No language interpreter was used.  Palpitations Palpitations quality:  Irregular Onset quality:  Unable to specify Timing:  Intermittent Progression:  Unable to specify Chronicity:  New Context: not anxiety   Relieved by:  Nothing Worsened by:  Nothing Ineffective treatments:  None tried Associated symptoms: no back pain, no chest pain and no cough   Risk factors: no diabetes mellitus        Past Medical History:  Diagnosis Date  . Arthritis    cervical spondylotic, radiculopathy  . Bradycardia    a. HR previously reported as 40 at PCP's office; event monitor 2017 showed nocturnal bradycardia but no daytime findings.  . CKD (chronic kidney disease), stage II   . Hypercholesteremia   . Hypertension   . Premature atrial contractions   . Prostate cancer ([place])   . PVC's (premature ventricular contractions)     Patient Active Problem List   Diagnosis Date Noted  . Degenerative joint disease of left hip 11/28/2018  . Prostate cancer ([place]) 08/29/2016  . Neck pain 05/12/2015  . Bradycardia 04/21/2014  . Hyperlipidemia 04/21/2014    Past Surgical History:  Procedure Laterality Date  . ANTERIOR CERVICAL DECOMP/DISCECTOMY FUSION N/A 05/12/2015   Procedure: ANTERIOR CERVICAL DECOMPRESSION/DISCECTOMY FUSION C3 - C6 3 LEVELS;  Surgeon: Melina Schools, MD;  Location: [facility];  Service: Orthopedics;  Laterality: N/A;  . colonscopy    . COLONSCOPY     . INSERTION PROSTATE RADIATION SEED  UNSURE OF DATE   DONE BY DR Karsten Ro   . PROSTATE BIOPSY    . ROTATOR CUFF REPAIR Right 03-13-12; 09-27-15  . TOTAL HIP ARTHROPLASTY Left 11/28/2018   Procedure: TOTAL HIP  ARTHROPLASTY;  Surgeon: Earlie Server, MD;  Location: [facility];  Service: Orthopedics;  Laterality: Left;       Family History  Problem Relation Age of Onset  . Healthy Mother   . Healthy Father   . Stroke Sister   . Cancer Sister        breast    Social History   Tobacco Use  . Smoking status: Former Smoker    Quit date: 06/26/1971    Years since quitting: 48.5  . Smokeless tobacco: Never Used  Vaping Use  . Vaping Use: Never used  Substance Use Topics  . Alcohol use: Yes    Comment: socially   . Drug use: No    Home Medications Prior to Admission medications   Medication Sig Start Date End Date Taking? Authorizing Provider  acetaminophen (TYLENOL) 650 MG CR tablet Take 650-1,300 mg by mouth every 8 (eight) hours as needed for pain.   Yes [provider]  amLODipine (NORVASC) 5 MG tablet Take 5 mg by mouth every evening.   Yes [provider]  atorvastatin (LIPITOR) 10 MG tablet Take 10 mg by mouth daily.   Yes [provider]  losartan (COZAAR) 100 MG tablet Take 100 mg by mouth daily.   Yes [provider]  magnesium oxide (MAG-OX) 400 MG tablet Take 400 mg by mouth daily.   Yes [provider]  Melatonin 10 MG CAPS Take 10-20 mg by  mouth at bedtime.   Yes [provider]  Multiple Vitamins-Minerals (MULTI FOR HIM 50+) TABS Take 1 tablet by mouth daily.   Yes [provider]  Nutritional Supplements (IMMUNE ENHANCE) TABS Take 1 tablet by mouth daily.   Yes [provider]    Allergies    Hydrocodone-acetaminophen and Hydrocodone  Review of Systems   Review of Systems  Constitutional: Negative for appetite change and fatigue.  HENT: Negative for congestion, ear discharge and sinus pressure.   Eyes: Negative for discharge.  Respiratory: Negative for cough.   Cardiovascular: Positive for palpitations. Negative for chest pain.  Gastrointestinal: Negative for abdominal pain and diarrhea.   Genitourinary: Negative for frequency and hematuria.  Musculoskeletal: Negative for back pain.  Skin: Negative for rash.  Neurological: Negative for seizures and headaches.  Psychiatric/Behavioral: Negative for hallucinations.    Physical Exam Updated Vital Signs BP (!) 126/57   Pulse (!) 50   Temp 98.6 F (37 C) (Oral)   Resp 10   Ht 5\' 11"  (1.803 m)   Wt 106.6 kg   SpO2 100%   BMI 32.78 kg/m   Physical Exam Vitals and nursing note reviewed.  Constitutional:      Appearance: He is well-developed.  HENT:     Head: Normocephalic.     Nose: Nose normal.  Eyes:     General: No scleral icterus.    Conjunctiva/sclera: Conjunctivae normal.  Neck:     Thyroid: No thyromegaly.  Cardiovascular:     Heart sounds: No murmur heard.  No friction rub. No gallop.      Comments: Irregular rate Pulmonary:     Breath sounds: No stridor. No wheezing or rales.  Chest:     Chest wall: No tenderness.  Abdominal:     General: There is no distension.     Tenderness: There is no abdominal tenderness. There is no rebound.  Musculoskeletal:        General: Normal range of motion.     Cervical back: Neck supple.  Lymphadenopathy:     Cervical: No cervical adenopathy.  Skin:    Findings: No erythema or rash.  Neurological:     Mental Status: He is alert and oriented to person, place, and time.     Motor: No abnormal muscle tone.     Coordination: Coordination normal.  Psychiatric:        Behavior: Behavior normal.     ED Results / Procedures / Treatments   Labs (all labs ordered are listed, but only abnormal results are displayed) Labs Reviewed  CBC WITH DIFFERENTIAL/PLATELET - Abnormal; Notable for the following components:      Result Value   RBC 4.20 (*)    Hemoglobin 12.0 (*)    HCT 33.9 (*)    All other components within normal limits  COMPREHENSIVE METABOLIC PANEL - Abnormal; Notable for the following components:   Glucose, Bld 104 (*)    Creatinine, Ser 1.41 (*)     Total Bilirubin 1.3 (*)    GFR calc non Af Amer 50 (*)    GFR calc Af Amer 58 (*)    All other components within normal limits  TROPONIN I (HIGH SENSITIVITY)  TROPONIN I (HIGH SENSITIVITY)    EKG None  Radiology DG Chest Port 1 View  Result Date: 01/01/2020 CLINICAL DATA:  Shortness of breath, fatigue EXAM: PORTABLE CHEST 1 VIEW COMPARISON:  03/09/2010 FINDINGS: Heart size is within normal limits. Thoracic aorta appears tortuous. No focal airspace consolidation,  pleural effusion, or pneumothorax. Degenerative changes of the bilateral shoulders. No acute osseous findings. IMPRESSION: 1. No active disease in the chest. 2. Thoracic aorta appears tortuous. Consider nonemergent CT of the chest to assess for thoracic aortic aneurysm. Electronically Signed   By: Davina Poke D.O.   On: 01/01/2020 12:34    Procedures Procedures (including critical care time)  Medications Ordered in ED Medications - No data to display  ED Course  I have reviewed the triage vital signs and the nursing notes.  Pertinent labs & imaging results that were available during my care of the patient were reviewed by me and considered in my medical decision making (see chart for details).    MDM Rules/Calculators/A&P                         Labs and x-rays unremarkable.  EKG shows trigeminy.  And a second EKG showed bradycardia.  I spoke with cardiology Dr. Marlou Porch and he feels like the patient can be discharged home with follow-up with his cardiologist        This patient presents to the ED for concern of palpitation, this involves an extensive number of treatment options, and is a complaint that carries with it a high risk of complications and morbidity.  The differential diagnosis includes history of.  PVCs   Lab Tests:   I Ordered, reviewed, and interpreted labs, which included CBC and chemistries which showed mild anemia  Medicines ordered:     Imaging Studies ordered:   I ordered imaging  studies which included chest x-ray and  I independently visualized and interpreted imaging which showed no acute disease  Additional history obtained:   Additional history obtained from records  Previous records obtained and reviewed.  Consultations Obtained:   I consulted cardiology and discussed lab and imaging findings  Reevaluation:  After the interventions stated above, I reevaluated the patient and found improved  Critical Interventions:  .   Final Clinical Impression(s) / ED Diagnoses Final diagnoses:  Palpitations    Rx / DC Orders ED Discharge Orders    None       Milton Ferguson, MD 01/01/20 1510

## 2020-01-01 NOTE — ED Triage Notes (Signed)
Pt here with c/o heart racing , , heart is not racing here , no pain , no sob , no nausea , pt has blood work done at his heart MD this morning

## 2020-01-01 NOTE — Discharge Instructions (Addendum)
Follow up with your cardiologist in 1-2 weeks.  Return if problems

## 2020-01-01 NOTE — ED Notes (Signed)
Pt is walking to car

## 2020-01-11 ENCOUNTER — Other Ambulatory Visit: Payer: Medicare HMO

## 2020-01-12 ENCOUNTER — Other Ambulatory Visit: Payer: Self-pay

## 2020-01-12 DIAGNOSIS — R7989 Other specified abnormal findings of blood chemistry: Secondary | ICD-10-CM

## 2020-01-15 DIAGNOSIS — R002 Palpitations: Secondary | ICD-10-CM | POA: Diagnosis not present

## 2020-01-18 ENCOUNTER — Other Ambulatory Visit: Payer: Self-pay

## 2020-01-18 ENCOUNTER — Ambulatory Visit (INDEPENDENT_AMBULATORY_CARE_PROVIDER_SITE_OTHER)
Admission: RE | Admit: 2020-01-18 | Discharge: 2020-01-18 | Disposition: A | Discharge status: Home / Self Care | Payer: Medicare HMO | Source: Ambulatory Visit | Attending: Interventional Cardiology | Admitting: Interventional Cardiology

## 2020-01-18 DIAGNOSIS — I712 Thoracic aortic aneurysm, without rupture, unspecified: Secondary | ICD-10-CM

## 2020-01-18 DIAGNOSIS — K449 Diaphragmatic hernia without obstruction or gangrene: Secondary | ICD-10-CM | POA: Diagnosis not present

## 2020-01-18 DIAGNOSIS — Z8679 Personal history of other diseases of the circulatory system: Secondary | ICD-10-CM | POA: Diagnosis not present

## 2020-01-18 DIAGNOSIS — I700 Atherosclerosis of aorta: Secondary | ICD-10-CM | POA: Diagnosis not present

## 2020-01-18 MED ORDER — IOHEXOL 350 MG/ML SOLN
100.0000 mL | Freq: Once | INTRAVENOUS | Status: AC | PRN
  Administered 2020-01-18: 100 mL via INTRAVENOUS

## 2020-01-20 ENCOUNTER — Telehealth: Payer: Self-pay

## 2020-01-20 NOTE — Telephone Encounter (Signed)
-----   Message from Jettie Booze, MD sent at 01/19/2020  2:12 PM EDT ----- Aortic root size at the upper limit of normal.  No f/u imaging needed.

## 2020-01-20 NOTE — Telephone Encounter (Signed)
LMTCB

## 2020-01-21 ENCOUNTER — Telehealth: Payer: Self-pay | Admitting: Interventional Cardiology

## 2020-01-21 NOTE — Telephone Encounter (Signed)
Transferred call to Ann 

## 2020-01-21 NOTE — Telephone Encounter (Signed)
Pt advised his CT results and verbalized understanding. He is having a repeat BMET tomorrow.

## 2020-01-22 ENCOUNTER — Other Ambulatory Visit: Payer: Self-pay

## 2020-01-22 ENCOUNTER — Other Ambulatory Visit: Payer: Self-pay | Admitting: *Deleted

## 2020-01-22 ENCOUNTER — Other Ambulatory Visit: Payer: Medicare HMO | Admitting: *Deleted

## 2020-01-22 DIAGNOSIS — R7989 Other specified abnormal findings of blood chemistry: Secondary | ICD-10-CM

## 2020-01-22 LAB — BASIC METABOLIC PANEL
BUN/Creatinine Ratio: 8 — ABNORMAL LOW (ref 10–24)
BUN: 10 mg/dL (ref 8–27)
CO2: 21 mmol/L (ref 20–29)
Calcium: 9.6 mg/dL (ref 8.6–10.2)
Chloride: 101 mmol/L (ref 96–106)
Creatinine, Ser: 1.31 mg/dL — ABNORMAL HIGH (ref 0.76–1.27)
GFR calc Af Amer: 63 mL/min/{1.73_m2} (ref >59)
GFR calc non Af Amer: 55 mL/min/{1.73_m2} — ABNORMAL LOW (ref >59)
Glucose: 156 mg/dL — ABNORMAL HIGH (ref 65–99)
Potassium: 3.9 mmol/L (ref 3.5–5.2)
Sodium: 137 mmol/L (ref 134–144)

## 2020-01-26 DIAGNOSIS — H5202 Hypermetropia, left eye: Secondary | ICD-10-CM | POA: Diagnosis not present

## 2020-02-01 DIAGNOSIS — M545 Low back pain: Secondary | ICD-10-CM | POA: Diagnosis not present

## 2020-02-08 ENCOUNTER — Telehealth: Payer: Self-pay | Admitting: Interventional Cardiology

## 2020-02-08 NOTE — Progress Notes (Addendum)
Cardiology Office Note    Date:  02/09/2020   ID:  MERDITH BOYD, DOB 1951-03-23, MRN [id number]  PCP:  [name], MD  Cardiologist: Larae Grooms, MD EPS: None  Chief Complaint  Patient presents with  . Follow-up    History of Present Illness:  Dennis Huffman is a 69 y.o. male with history of HTN, HLD, Palpitations with PAC's. Saw Dr. Irish Lack 07/07/19 with atypical chest pain. Echo ordered and would consider coronary CTA if exertional or persistent symptoms. Echo 07/15/19 normal LV function with mildly dilated aortic root. CTA 01/18/20 Aortic root 37 mm no need for f/u imaging per Dr.Varanasi.  Went to ER 01/01/20 with palpitations EKG NSR with PVC's, another EKG showed Sinus brady in 50's with trigeminy. K normal Crt 1.41, Hgb 12. Labs 01/22/20 K 3.9, Crt 1.31  Patient added onto my schedule for palpitations.Notices it mostly at night. Described as a "rough" heart beat. Says it doesn't go fast. Just irreg. No chest pain,dyspnea, dizziness or presyncope. No significant caffeine. Just drinks water. No alcohol. Walks 30-40 min twice a week. Wore a heart monitor 2 weeks ago for 4 days and irritated his skin so had to take it off. He still has irritation from it. Patient is taking some over the counter supplements that he didn't bring with him. One is for his memory.   Past Medical History:  Diagnosis Date  . Arthritis    cervical spondylotic, radiculopathy  . Bradycardia    a. HR previously reported as 40 at PCP's office; event monitor 2017 showed nocturnal bradycardia but no daytime findings.  . CKD (chronic kidney disease), stage II   . Hypercholesteremia   . Hypertension   . Premature atrial contractions   . Prostate cancer ([place])   . PVC's (premature ventricular contractions)     Past Surgical History:  Procedure Laterality Date  . ANTERIOR CERVICAL DECOMP/DISCECTOMY FUSION N/A 05/12/2015   Procedure: ANTERIOR CERVICAL DECOMPRESSION/DISCECTOMY FUSION C3 - C6 3  LEVELS;  Surgeon: Melina Schools, MD;  Location: [facility];  Service: Orthopedics;  Laterality: N/A;  . colonscopy    . COLONSCOPY     . INSERTION PROSTATE RADIATION SEED  UNSURE OF DATE   DONE BY DR Karsten Ro   . PROSTATE BIOPSY    . ROTATOR CUFF REPAIR Right 03-13-12; 09-27-15  . TOTAL HIP ARTHROPLASTY Left 11/28/2018   Procedure: TOTAL HIP ARTHROPLASTY;  Surgeon: Earlie Server, MD;  Location: [facility];  Service: Orthopedics;  Laterality: Left;    Current Medications: Current Meds  Medication Sig  . acetaminophen (TYLENOL) 650 MG CR tablet Take 650-1,300 mg by mouth every 8 (eight) hours as needed for pain.  [name] amLODipine (NORVASC) 5 MG tablet Take 5 mg by mouth every evening.  [name] atorvastatin (LIPITOR) 10 MG tablet Take 10 mg by mouth daily.  [name] losartan (COZAAR) 100 MG tablet Take 100 mg by mouth daily.  . magnesium oxide (MAG-OX) 400 MG tablet Take 400 mg by mouth daily.  . Melatonin 10 MG CAPS Take 10-20 mg by mouth at bedtime.  . Multiple Vitamins-Minerals (MULTI FOR HIM 50+) TABS Take 1 tablet by mouth daily.  . Nutritional Supplements (IMMUNE ENHANCE) TABS Take 1 tablet by mouth daily.     Allergies:   Hydrocodone-acetaminophen and Hydrocodone   Social History   Socioeconomic History  . Marital status: Single    Spouse name: Not on file  . Number of children: Not on file  . Years of education: Not on file  .  Highest education level: Not on file  Occupational History  . Not on file  Tobacco Use  . Smoking status: Former Smoker    Quit date: 06/26/1971    Years since quitting: 48.6  . Smokeless tobacco: Never Used  Vaping Use  . Vaping Use: Never used  Substance and Sexual Activity  . Alcohol use: Yes    Comment: socially   . Drug use: No  . Sexual activity: Yes  Other Topics Concern  . Not on file  Social History Narrative   Right handed    Caffeine use: 1 cup coffee per day   Soda- not on a regular basis   Lives with mother, sister, brother, and nephew.   Social  Determinants of Health   Financial Resource Strain:   . Difficulty of Paying Living Expenses:   Food Insecurity:   . Worried About Charity fundraiser in the Last Year:   . Arboriculturist in the Last Year:   Transportation Needs:   . Film/video editor (Medical):   [name] Lack of Transportation (Non-Medical):   Physical Activity:   . Days of Exercise per Week:   . Minutes of Exercise per Session:   Stress:   . Feeling of Stress :   Social Connections:   . Frequency of Communication with Friends and Family:   . Frequency of Social Gatherings with Friends and Family:   . Attends Religious Services:   . Active Member of Clubs or Organizations:   . Attends Archivist Meetings:   [name] Marital Status:      Family History:  The patient's family history includes Cancer in his sister; Healthy in his father and mother; Stroke in his sister.   ROS:   Please see the history of present illness.    ROS All other systems reviewed and are negative.   PHYSICAL EXAM:   VS:  BP (!) 144/82   Pulse 82   Ht 5\' 11"  (1.803 m)   Wt 230 lb 12.8 oz (104.7 kg)   SpO2 98%   BMI 32.19 kg/m   Physical Exam  GEN: Well nourished, well developed, in no acute distress  Neck: no JVD, carotid bruits, or masses Cardiac:RRR; no murmurs, rubs, or gallops  Respiratory:  clear to auscultation bilaterally, normal work of breathing GI: soft, nontender, nondistended, + BS Ext: without cyanosis, clubbing, or edema, Good distal pulses bilaterally Neuro:  Alert and Oriented x 3 Psych: euthymic mood, full affect  Wt Readings from Last 3 Encounters:  02/09/20 230 lb 12.8 oz (104.7 kg)  01/01/20 235 lb (106.6 kg)  09/30/19 245 lb (111.1 kg)      Studies/Labs Reviewed:   EKG:  EKG is not ordered today.    Recent Labs: 01/01/2020: ALT 29; Hemoglobin 12.0; Platelets 176 01/22/2020: BUN 10; Creatinine, Ser 1.31; Potassium 3.9; Sodium 137   Lipid Panel No results found for: CHOL, TRIG, HDL, CHOLHDL,  VLDL, LDLCALC, LDLDIRECT  Additional studies/ records that were reviewed today include:  CTA 7/28/21IMPRESSION: Greatest diameter of the ascending aorta on this non gated CT chest is estimated 37 mm. No comparison cross-sectional imaging available.   Small hiatal hernia   Minimal atherosclerosis of the thoracic aorta. Aortic Atherosclerosis (ICD10-I70.0).   Signed,   Dulcy Fanny. Dellia Nims, RPVI   [facility]   [facility]     Electronically Signed   By: Corrie Mckusick D.O.   On: 01/18/2020 11:58  Echo 1/2021IMPRESSIONS  1. Left ventricular ejection fraction, by visual estimation, is 60 to  65%. The left ventricle has normal function. There is mildly increased  left ventricular hypertrophy.   2. The left ventricle has no regional wall motion abnormalities.   3. Global right ventricle has normal systolic function.The right  ventricular size is normal. No increase in right ventricular wall  thickness.   4. Left atrial size was mildly dilated.   5. Right atrial size was normal.   6. The mitral valve is normal in structure. Mild mitral valve  regurgitation. No evidence of mitral stenosis.   7. The tricuspid valve is normal in structure.   8. The tricuspid valve is normal in structure. Tricuspid valve  regurgitation is not demonstrated.   9. The aortic valve is tricuspid. Aortic valve regurgitation is not  visualized. Mild aortic valve sclerosis without stenosis.  10. The pulmonic valve was normal in structure. Pulmonic valve  regurgitation is not visualized.  11. Aortic dilatation noted.  12. There is mild dilatation of the ascending aorta measuring 42 mm.  13. The inferior vena cava is normal in size with greater than 50%  respiratory variability, suggesting right atrial pressure of 3 mmHg.   FINDINGS   Left Ventricle: Left ventricular ejection fraction, by visual estimation,  is 60 to 65%. The left ventricle has normal  function. The left ventricle  has no regional wall motion abnormalities. There is mildly increased left  ventricular hypertrophy. Left  ventricular diastolic parameters were normal. Normal left atrial pressure.   Right Ventricle: The right ventricular size is normal. No increase in  right ventricular wall thickness. Global RV systolic function is has  normal systolic function.   Left Atrium: Left atrial size was mildly dilated.   Right Atrium: Right atrial size was normal in size   Pericardium: There is no evidence of pericardial effusion.   Mitral Valve: The mitral valve is normal in structure. There is mild  thickening of the mitral valve leaflet(s). Mild mitral valve  regurgitation. No evidence of mitral valve stenosis by observation.   Tricuspid Valve: The tricuspid valve is normal in structure. Tricuspid  valve regurgitation is not demonstrated.   Aortic Valve: The aortic valve is tricuspid. Aortic valve regurgitation is  not visualized. Mild aortic valve sclerosis is present, with no evidence  of aortic valve stenosis.   Pulmonic Valve: The pulmonic valve was normal in structure. Pulmonic valve  regurgitation is not visualized. Pulmonic regurgitation is not visualized.   Aorta: The aortic root, ascending aorta and aortic arch are all  structurally normal, with no evidence of dilitation or obstruction and  aortic dilatation noted. There is mild dilatation of the ascending aorta  measuring 42 mm.   Venous: The inferior vena cava is normal in size with greater than 50%  respiratory variability, suggesting right atrial pressure of 3 mmHg.   IAS/Shunts: No atrial level shunt detected by color flow Doppler. There is  no evidence of a patent foramen ovale. No ventricular septal defect is  seen or detected. There is no evidence of an atrial septal defect.        ASSESSMENT:    1. Palpitations   2. Essential hypertension   3. Hyperlipidemia, unspecified hyperlipidemia  type   4. Chest pain, unspecified type      PLAN:  In order of problems listed above:  Palpitations with history of PAC's, baseline bradycardia. PVC's in ER 01/01/20-wore a monitor for 4 days but irritated his skin so had to  take off. Still doesn't have results. No exertional symptoms, mostly at night. No associated symptoms of chest pain, dyspnea, dizziness or presyncope. Will call for holter results and add low dose toprol to see if he tolerates.Labs including TSH all normal. Patient will call and let us know supplements he's taking.  Addendum: Patient called back and is taking an energy supplement that has caffeine 300 mg, creatine, and tea extract. I asked him to stop this an can hold off on toprol for now and see how he feels.  HTN-BP stable to add toprol  HLD LDL 72 12/25/19  History of Atypical chest pain-none recently.    Medication Adjustments/Labs and Tests Ordered: Current medicines are reviewed at length with the patient today.  Concerns regarding medicines are outlined above.  Medication changes, Labs and Tests ordered today are listed in the Patient Instructions below. Patient Instructions  Medication Instructions:  Your physician has recommended you make the following change in your medication:   START: Metoprolol Succinate 25mg  daily  *If you need a refill on your cardiac medications before your next appointment, please call your pharmacy*   Lab Work: None  If you have labs (blood work) drawn today and your tests are completely normal, you will receive your results only by: [name] MyChart Message (if you have MyChart) OR . A paper copy in the mail If you have any lab test that is abnormal or we need to change your treatment, we will call you to review the results.   Testing/Procedures: None   Follow-Up: At [facility], you and your health needs are our priority.  As part of our continuing mission to provide you with exceptional heart care, we have created  designated Provider Care Teams.  These Care Teams include your primary Cardiologist (physician) and Advanced Practice Providers (APPs -  Physician Assistants and Nurse Practitioners) who all work together to provide you with the care you need, when you need it.  We recommend signing up for the patient portal called "MyChart".  Sign up information is provided on this After Visit Summary.  MyChart is used to connect with patients for Virtual Visits (Telemedicine).  Patients are able to view lab/test results, encounter notes, upcoming appointments, etc.  Non-urgent messages can be sent to your provider as well.   To learn more about what you can do with MyChart, go to [email or web address].    Your next appointment:   02/23/2020  The format for your next appointment:   In Person  Provider:   Ermalinda Barrios, PA-C   Other Instructions None     Signed, Ermalinda Barrios, PA-C  02/09/2020 8:06 AM    [address], [place], [place]  [address] Phone: [phone]; Fax: [phone]

## 2020-02-08 NOTE — Telephone Encounter (Signed)
Left message for patient to call back  

## 2020-02-08 NOTE — Telephone Encounter (Signed)
Patient c/o Palpitations:  High priority if patient c/o lightheadedness, shortness of breath, or chest pain  1) How long have you had palpitations/irregular HR/ Afib? Are you having the symptoms now? Not now, yesterday  2) Are you currently experiencing lightheadedness, SOB or CP? no  3) Do you have a history of afib (atrial fibrillation) or irregular heart rhythm? no  4) Have you checked your BP or HR? (document readings if available): no  5) Are you experiencing any other symptoms? No   Patient states he had some irregular heart beats yesterday, but is not currently having symptoms. He is scheduled tomorrow 02/09/2020 with Ermalinda Barrios.

## 2020-02-09 ENCOUNTER — Ambulatory Visit: Payer: Medicare HMO | Admitting: Physician Assistant

## 2020-02-09 ENCOUNTER — Encounter: Payer: Self-pay | Admitting: Physician Assistant

## 2020-02-09 ENCOUNTER — Other Ambulatory Visit: Payer: Self-pay

## 2020-02-09 VITALS — BP 144/82 | HR 82 | Ht 71.0 in | Wt 230.8 lb

## 2020-02-09 DIAGNOSIS — E785 Hyperlipidemia, unspecified: Secondary | ICD-10-CM | POA: Diagnosis not present

## 2020-02-09 DIAGNOSIS — R002 Palpitations: Secondary | ICD-10-CM | POA: Diagnosis not present

## 2020-02-09 DIAGNOSIS — I10 Essential (primary) hypertension: Secondary | ICD-10-CM

## 2020-02-09 DIAGNOSIS — R079 Chest pain, unspecified: Secondary | ICD-10-CM

## 2020-02-09 DIAGNOSIS — F17211 Nicotine dependence, cigarettes, in remission: Secondary | ICD-10-CM | POA: Diagnosis not present

## 2020-02-09 DIAGNOSIS — E669 Obesity, unspecified: Secondary | ICD-10-CM | POA: Diagnosis not present

## 2020-02-09 DIAGNOSIS — I700 Atherosclerosis of aorta: Secondary | ICD-10-CM | POA: Diagnosis not present

## 2020-02-09 DIAGNOSIS — N289 Disorder of kidney and ureter, unspecified: Secondary | ICD-10-CM | POA: Diagnosis not present

## 2020-02-09 DIAGNOSIS — G4700 Insomnia, unspecified: Secondary | ICD-10-CM | POA: Diagnosis not present

## 2020-02-09 DIAGNOSIS — D649 Anemia, unspecified: Secondary | ICD-10-CM | POA: Diagnosis not present

## 2020-02-09 MED ORDER — METOPROLOL SUCCINATE ER 25 MG PO TB24: 25.0000 mg | ORAL_TABLET | Freq: Every day | ORAL | 3 refills | Status: DC

## 2020-02-09 NOTE — Patient Instructions (Signed)
Medication Instructions:  Your physician has recommended you make the following change in your medication:   START: Metoprolol Succinate 25mg  daily  *If you need a refill on your cardiac medications before your next appointment, please call your pharmacy*   Lab Work: None  If you have labs (blood work) drawn today and your tests are completely normal, you will receive your results only by: [name] MyChart Message (if you have MyChart) OR . A paper copy in the mail If you have any lab test that is abnormal or we need to change your treatment, we will call you to review the results.   Testing/Procedures: None   Follow-Up: At [facility], you and your health needs are our priority.  As part of our continuing mission to provide you with exceptional heart care, we have created designated Provider Care Teams.  These Care Teams include your primary Cardiologist (physician) and Advanced Practice Providers (APPs -  Physician Assistants and Nurse Practitioners) who all work together to provide you with the care you need, when you need it.  We recommend signing up for the patient portal called "MyChart".  Sign up information is provided on this After Visit Summary.  MyChart is used to connect with patients for Virtual Visits (Telemedicine).  Patients are able to view lab/test results, encounter notes, upcoming appointments, etc.  Non-urgent messages can be sent to your provider as well.   To learn more about what you can do with MyChart, go to [email or web address].    Your next appointment:   02/23/2020  The format for your next appointment:   In Person  Provider:   Ermalinda Barrios, PA-C   Other Instructions None

## 2020-02-10 DIAGNOSIS — R002 Palpitations: Secondary | ICD-10-CM | POA: Diagnosis not present

## 2020-02-10 DIAGNOSIS — I10 Essential (primary) hypertension: Secondary | ICD-10-CM | POA: Diagnosis not present

## 2020-02-10 NOTE — Telephone Encounter (Signed)
Patient was seen in the office yesterday.

## 2020-02-15 DIAGNOSIS — Z86010 Personal history of colonic polyps: Secondary | ICD-10-CM | POA: Diagnosis not present

## 2020-02-15 DIAGNOSIS — K921 Melena: Secondary | ICD-10-CM | POA: Diagnosis not present

## 2020-02-22 NOTE — Progress Notes (Signed)
Cardiology Office Note    Date:  02/23/2020   ID:  Dennis Huffman, DOB 12-Nov-1950, MRN [id number]  PCP:  [name], MD  Cardiologist: Larae Grooms, MD EPS: None  Chief Complaint  Patient presents with  . Follow-up    History of Present Illness:  Dennis Huffman is a 69 y.o. male with history of HTN, HLD, Palpitations with PAC's. Saw Dr. Irish Lack 07/07/19 with atypical chest pain. Echo ordered and would consider coronary CTA if exertional or persistent symptoms. Echo 07/15/19 normal LV function with mildly dilated aortic root. CTA 01/18/20 Aortic root 37 mm no need for f/u imaging per Dr.Varanasi.   Went to ER 01/01/20 with palpitations EKG NSR with PVC's, another EKG showed Sinus brady in 50's with trigeminy. K normal Crt 1.41, Hgb 12. Labs 01/22/20 K 3.9, Crt 1.31.  He was taking an energy supplement that had caffeine, creatine and tea extract.  Holter monitor showed 17 runs of SVT 5-10 beats long and some bigeminy and trigeminy.  Patient comes in for f/u. He's still having some palpitations, notices most at night when he lays down. Overall has improved.        Past Medical History:  Diagnosis Date  . Arthritis    cervical spondylotic, radiculopathy  . Bradycardia    a. HR previously reported as 40 at PCP's office; event monitor 2017 showed nocturnal bradycardia but no daytime findings.  . CKD (chronic kidney disease), stage II   . Hypercholesteremia   . Hypertension   . Premature atrial contractions   . Prostate cancer ([place])   . PVC's (premature ventricular contractions)     Past Surgical History:  Procedure Laterality Date  . ANTERIOR CERVICAL DECOMP/DISCECTOMY FUSION N/A 05/12/2015   Procedure: ANTERIOR CERVICAL DECOMPRESSION/DISCECTOMY FUSION C3 - C6 3 LEVELS;  Surgeon: Melina Schools, MD;  Location: [facility];  Service: Orthopedics;  Laterality: N/A;  . colonscopy    . COLONSCOPY     . INSERTION PROSTATE RADIATION SEED  UNSURE OF DATE   DONE BY DR Karsten Ro   .  PROSTATE BIOPSY    . ROTATOR CUFF REPAIR Right 03-13-12; 09-27-15  . TOTAL HIP ARTHROPLASTY Left 11/28/2018   Procedure: TOTAL HIP ARTHROPLASTY;  Surgeon: Earlie Server, MD;  Location: [facility];  Service: Orthopedics;  Laterality: Left;    Current Medications: Current Meds  Medication Sig  . acetaminophen (TYLENOL) 650 MG CR tablet Take 650-1,300 mg by mouth every 8 (eight) hours as needed for pain.  [name] amLODipine (NORVASC) 5 MG tablet Take 5 mg by mouth every evening.  [name] atorvastatin (LIPITOR) 10 MG tablet Take 10 mg by mouth daily.  [name] losartan (COZAAR) 100 MG tablet Take 100 mg by mouth daily.  . magnesium oxide (MAG-OX) 400 MG tablet Take 400 mg by mouth daily.  . Melatonin 10 MG CAPS Take 10-20 mg by mouth at bedtime.  . metoprolol succinate (TOPROL-XL) 25 MG 24 hr tablet Take 1 tablet (25 mg total) by mouth daily.  . Multiple Vitamins-Minerals (MULTI FOR HIM 50+) TABS Take 1 tablet by mouth daily.  . Nutritional Supplements (IMMUNE ENHANCE) TABS Take 1 tablet by mouth daily.     Allergies:   Hydrocodone-acetaminophen and Hydrocodone   Social History   Socioeconomic History  . Marital status: Single    Spouse name: Not on file  . Number of children: Not on file  . Years of education: Not on file  . Highest education level: Not on file  Occupational History  .  Not on file  Tobacco Use  . Smoking status: Former Smoker    Quit date: 06/26/1971    Years since quitting: 48.6  . Smokeless tobacco: Never Used  Vaping Use  . Vaping Use: Never used  Substance and Sexual Activity  . Alcohol use: Yes    Comment: socially   . Drug use: No  . Sexual activity: Yes  Other Topics Concern  . Not on file  Social History Narrative   Right handed    Caffeine use: 1 cup coffee per day   Soda- not on a regular basis   Lives with mother, sister, brother, and nephew.   Social Determinants of Health   Financial Resource Strain:   . Difficulty of Paying Living Expenses: Not on file  Food  Insecurity:   . Worried About Charity fundraiser in the Last Year: Not on file  . Ran Out of Food in the Last Year: Not on file  Transportation Needs:   . Lack of Transportation (Medical): Not on file  . Lack of Transportation (Non-Medical): Not on file  Physical Activity:   . Days of Exercise per Week: Not on file  . Minutes of Exercise per Session: Not on file  Stress:   . Feeling of Stress : Not on file  Social Connections:   . Frequency of Communication with Friends and Family: Not on file  . Frequency of Social Gatherings with Friends and Family: Not on file  . Attends Religious Services: Not on file  . Active Member of Clubs or Organizations: Not on file  . Attends Archivist Meetings: Not on file  . Marital Status: Not on file     Family History:  The patient's family history includes Cancer in his sister; Healthy in his father and mother; Stroke in his sister.   ROS:   Please see the history of present illness.    ROS All other systems reviewed and are negative.   PHYSICAL EXAM:   VS:  BP 114/64   Pulse 64   Ht 5\' 11"  (1.803 m)   Wt 230 lb (104.3 kg)   SpO2 97%   BMI 32.08 kg/m   Physical Exam  GEN: Well nourished, well developed, in no acute distress  Neck: no JVD, carotid bruits, or masses Cardiac:RRR; no murmurs, rubs, or gallops  Respiratory:  clear to auscultation bilaterally, normal work of breathing GI: soft, nontender, nondistended, + BS Ext: without cyanosis, clubbing, or edema, Good distal pulses bilaterally Neuro:  Alert and Oriented x 3 Psych: euthymic mood, full affect  Wt Readings from Last 3 Encounters:  02/23/20 230 lb (104.3 kg)  02/09/20 230 lb 12.8 oz (104.7 kg)  01/01/20 235 lb (106.6 kg)      Studies/Labs Reviewed:   EKG:  EKG is not ordered today.    Recent Labs: 01/01/2020: ALT 29; Hemoglobin 12.0; Platelets 176 01/22/2020: BUN 10; Creatinine, Ser 1.31; Potassium 3.9; Sodium 137   Lipid Panel No results found for:  CHOL, TRIG, HDL, CHOLHDL, VLDL, LDLCALC, LDLDIRECT  Additional studies/ records that were reviewed today include:  CTA 7/28/21IMPRESSION: Greatest diameter of the ascending aorta on this non gated CT chest is estimated 37 mm. No comparison cross-sectional imaging available.   Small hiatal hernia   Minimal atherosclerosis of the thoracic aorta. Aortic Atherosclerosis (ICD10-I70.0).   Signed,   Dulcy Fanny. Dellia Nims, [place]   [facility]   [facility]     Electronically Signed  By: Corrie Mckusick D.O.   On: 01/18/2020 11:58   Echo 1/2021IMPRESSIONS     1. Left ventricular ejection fraction, by visual estimation, is 60 to  65%. The left ventricle has normal function. There is mildly increased  left ventricular hypertrophy.   2. The left ventricle has no regional wall motion abnormalities.   3. Global right ventricle has normal systolic function.The right  ventricular size is normal. No increase in right ventricular wall  thickness.   4. Left atrial size was mildly dilated.   5. Right atrial size was normal.   6. The mitral valve is normal in structure. Mild mitral valve  regurgitation. No evidence of mitral stenosis.   7. The tricuspid valve is normal in structure.   8. The tricuspid valve is normal in structure. Tricuspid valve  regurgitation is not demonstrated.   9. The aortic valve is tricuspid. Aortic valve regurgitation is not  visualized. Mild aortic valve sclerosis without stenosis.  10. The pulmonic valve was normal in structure. Pulmonic valve  regurgitation is not visualized.  11. Aortic dilatation noted.  12. There is mild dilatation of the ascending aorta measuring 42 mm.  13. The inferior vena cava is normal in size with greater than 50%  respiratory variability, suggesting right atrial pressure of 3 mmHg.   FINDINGS   Left Ventricle: Left ventricular ejection fraction, by visual estimation,  is 60 to 65%. The  left ventricle has normal function. The left ventricle  has no regional wall motion abnormalities. There is mildly increased left  ventricular hypertrophy. Left  ventricular diastolic parameters were normal. Normal left atrial pressure.   Right Ventricle: The right ventricular size is normal. No increase in  right ventricular wall thickness. Global RV systolic function is has  normal systolic function.   Left Atrium: Left atrial size was mildly dilated.   Right Atrium: Right atrial size was normal in size   Pericardium: There is no evidence of pericardial effusion.   Mitral Valve: The mitral valve is normal in structure. There is mild  thickening of the mitral valve leaflet(s). Mild mitral valve  regurgitation. No evidence of mitral valve stenosis by observation.   Tricuspid Valve: The tricuspid valve is normal in structure. Tricuspid  valve regurgitation is not demonstrated.   Aortic Valve: The aortic valve is tricuspid. Aortic valve regurgitation is  not visualized. Mild aortic valve sclerosis is present, with no evidence  of aortic valve stenosis.   Pulmonic Valve: The pulmonic valve was normal in structure. Pulmonic valve  regurgitation is not visualized. Pulmonic regurgitation is not visualized.   Aorta: The aortic root, ascending aorta and aortic arch are all  structurally normal, with no evidence of dilitation or obstruction and  aortic dilatation noted. There is mild dilatation of the ascending aorta  measuring 42 mm.   Venous: The inferior vena cava is normal in size with greater than 50%  respiratory variability, suggesting right atrial pressure of 3 mmHg.   IAS/Shunts: No atrial level shunt detected by color flow Doppler. There is  no evidence of a patent foramen ovale. No ventricular septal defect is  seen or detected. There is no evidence of an atrial septal defect.         ASSESSMENT:    1. Palpitations   2. Essential hypertension   3. Hyperlipidemia,  unspecified hyperlipidemia type   4. History of chest pain      PLAN:  In order of problems listed above:  Palpitations with history of  PACs and baseline bradycardia.  Patient only wore a monitor for 4 days because it irritated the skin so he had to take it off.  He was taking an energy supplement that had 300 mg of caffeine creatine and tea extract in it.  I asked him to stop this. Also on metoprolol 25 mg daily. Palpitations have improved. Only notices when he lays on his left side at night. Can take an extra metoprolol as needed for tachycardia.  Essential hypertension controled  HLD LDL 72 12/25/2019  History of atypical chest pain-no recurrent chest pain   Medication Adjustments/Labs and Tests Ordered: Current medicines are reviewed at length with the patient today.  Concerns regarding medicines are outlined above.  Medication changes, Labs and Tests ordered today are listed in the Patient Instructions below. Patient Instructions  Medication Instructions:  *If you need a refill on your cardiac medications before your next appointment, please call your pharmacy*  Follow-Up: At [facility], you and your health needs are our priority.  As part of our continuing mission to provide you with exceptional heart care, we have created designated Provider Care Teams.  These Care Teams include your primary Cardiologist (physician) and Advanced Practice Providers (APPs -  Physician Assistants and Nurse Practitioners) who all work together to provide you with the care you need, when you need it.  We recommend signing up for the patient portal called "MyChart".  Sign up information is provided on this After Visit Summary.  MyChart is used to connect with patients for Virtual Visits (Telemedicine).  Patients are able to view lab/test results, encounter notes, upcoming appointments, etc.  Non-urgent messages can be sent to your provider as well.   To learn more about what you can do with MyChart, go  to [email or web address].    Your next appointment:   Your physician recommends that you schedule a follow-up appointment in: 6 MONTHS with Dr. Irish Lack  The format for your next appointment:   In Person with Casandra Doffing, MD        Signed, Ermalinda Barrios, PA-C  02/23/2020 2:20 PM    [address], [place], [place]  [address] Phone: [phone]; Fax: [phone]

## 2020-02-23 ENCOUNTER — Other Ambulatory Visit: Payer: Self-pay

## 2020-02-23 ENCOUNTER — Ambulatory Visit: Payer: Medicare HMO | Admitting: Physician Assistant

## 2020-02-23 ENCOUNTER — Encounter: Payer: Self-pay | Admitting: Physician Assistant

## 2020-02-23 VITALS — BP 114/64 | HR 64 | Ht 71.0 in | Wt 230.0 lb

## 2020-02-23 DIAGNOSIS — E785 Hyperlipidemia, unspecified: Secondary | ICD-10-CM | POA: Diagnosis not present

## 2020-02-23 DIAGNOSIS — Z87898 Personal history of other specified conditions: Secondary | ICD-10-CM

## 2020-02-23 DIAGNOSIS — I10 Essential (primary) hypertension: Secondary | ICD-10-CM

## 2020-02-23 DIAGNOSIS — R002 Palpitations: Secondary | ICD-10-CM | POA: Diagnosis not present

## 2020-02-23 NOTE — Patient Instructions (Signed)
Medication Instructions:  *If you need a refill on your cardiac medications before your next appointment, please call your pharmacy*  Follow-Up: At [facility], you and your health needs are our priority.  As part of our continuing mission to provide you with exceptional heart care, we have created designated Provider Care Teams.  These Care Teams include your primary Cardiologist (physician) and Advanced Practice Providers (APPs -  Physician Assistants and Nurse Practitioners) who all work together to provide you with the care you need, when you need it.  We recommend signing up for the patient portal called "MyChart".  Sign up information is provided on this After Visit Summary.  MyChart is used to connect with patients for Virtual Visits (Telemedicine).  Patients are able to view lab/test results, encounter notes, upcoming appointments, etc.  Non-urgent messages can be sent to your provider as well.   To learn more about what you can do with MyChart, go to [email or web address].    Your next appointment:   Your physician recommends that you schedule a follow-up appointment in: 6 MONTHS with Dr. Irish Lack  The format for your next appointment:   In Person with Casandra Doffing, MD

## 2020-03-04 DIAGNOSIS — C61 Malignant neoplasm of prostate: Secondary | ICD-10-CM | POA: Diagnosis not present

## 2020-03-11 DIAGNOSIS — N5201 Erectile dysfunction due to arterial insufficiency: Secondary | ICD-10-CM | POA: Diagnosis not present

## 2020-03-11 DIAGNOSIS — Z8546 Personal history of malignant neoplasm of prostate: Secondary | ICD-10-CM | POA: Diagnosis not present

## 2020-03-18 DIAGNOSIS — L810 Postinflammatory hyperpigmentation: Secondary | ICD-10-CM | POA: Diagnosis not present

## 2020-03-18 DIAGNOSIS — R21 Rash and other nonspecific skin eruption: Secondary | ICD-10-CM | POA: Diagnosis not present

## 2020-03-25 DIAGNOSIS — Z1159 Encounter for screening for other viral diseases: Secondary | ICD-10-CM | POA: Diagnosis not present

## 2020-04-13 DIAGNOSIS — Z86010 Personal history of colonic polyps: Secondary | ICD-10-CM | POA: Diagnosis not present

## 2020-04-13 DIAGNOSIS — K921 Melena: Secondary | ICD-10-CM | POA: Diagnosis not present

## 2020-04-21 DIAGNOSIS — Z1159 Encounter for screening for other viral diseases: Secondary | ICD-10-CM | POA: Diagnosis not present

## 2020-04-25 ENCOUNTER — Ambulatory Visit: Payer: Medicare HMO | Admitting: Interventional Cardiology

## 2020-04-26 DIAGNOSIS — K2930 Chronic superficial gastritis without bleeding: Secondary | ICD-10-CM | POA: Diagnosis not present

## 2020-04-26 DIAGNOSIS — K449 Diaphragmatic hernia without obstruction or gangrene: Secondary | ICD-10-CM | POA: Diagnosis not present

## 2020-04-26 DIAGNOSIS — K222 Esophageal obstruction: Secondary | ICD-10-CM | POA: Diagnosis not present

## 2020-04-26 DIAGNOSIS — K921 Melena: Secondary | ICD-10-CM | POA: Diagnosis not present

## 2020-04-26 DIAGNOSIS — B9681 Helicobacter pylori [H. pylori] as the cause of diseases classified elsewhere: Secondary | ICD-10-CM | POA: Diagnosis not present

## 2020-04-26 DIAGNOSIS — K2970 Gastritis, unspecified, without bleeding: Secondary | ICD-10-CM | POA: Diagnosis not present

## 2020-04-27 DIAGNOSIS — N183 Chronic kidney disease, stage 3 unspecified: Secondary | ICD-10-CM | POA: Diagnosis not present

## 2020-04-27 DIAGNOSIS — Z23 Encounter for immunization: Secondary | ICD-10-CM | POA: Diagnosis not present

## 2020-05-02 DIAGNOSIS — B9681 Helicobacter pylori [H. pylori] as the cause of diseases classified elsewhere: Secondary | ICD-10-CM | POA: Diagnosis not present

## 2020-05-02 DIAGNOSIS — K2930 Chronic superficial gastritis without bleeding: Secondary | ICD-10-CM | POA: Diagnosis not present

## 2020-05-08 ENCOUNTER — Other Ambulatory Visit: Payer: Self-pay | Admitting: Physician Assistant

## 2020-05-18 DIAGNOSIS — L821 Other seborrheic keratosis: Secondary | ICD-10-CM | POA: Diagnosis not present

## 2020-05-18 DIAGNOSIS — L810 Postinflammatory hyperpigmentation: Secondary | ICD-10-CM | POA: Diagnosis not present

## 2020-05-18 DIAGNOSIS — R21 Rash and other nonspecific skin eruption: Secondary | ICD-10-CM | POA: Diagnosis not present

## 2020-05-24 ENCOUNTER — Ambulatory Visit (INDEPENDENT_AMBULATORY_CARE_PROVIDER_SITE_OTHER): Payer: Medicare HMO | Admitting: Otolaryngology

## 2020-05-24 ENCOUNTER — Other Ambulatory Visit: Payer: Self-pay

## 2020-05-24 DIAGNOSIS — H9313 Tinnitus, bilateral: Secondary | ICD-10-CM

## 2020-05-24 NOTE — Progress Notes (Signed)
HPI: Dennis Huffman is a 69 y.o. male who presents is referred by audiology for evaluation of tinnitus.  Patient apparently has had ringing in his ears for a couple of years.  He brings with him audiologic testing that demonstrated a mild bilateral symmetric sensorineural hearing loss ranging between 20 and 35 DB. He denies any recent loud noise exposure. He did serve in [place]..  Past Medical History:  Diagnosis Date  . Arthritis    cervical spondylotic, radiculopathy  . Bradycardia    a. HR previously reported as 40 at PCP's office; event monitor 2017 showed nocturnal bradycardia but no daytime findings.  . CKD (chronic kidney disease), stage II   . Hypercholesteremia   . Hypertension   . Premature atrial contractions   . Prostate cancer ([place])   . PVC's (premature ventricular contractions)    Past Surgical History:  Procedure Laterality Date  . ANTERIOR CERVICAL DECOMP/DISCECTOMY FUSION N/A 05/12/2015   Procedure: ANTERIOR CERVICAL DECOMPRESSION/DISCECTOMY FUSION C3 - C6 3 LEVELS;  Surgeon: Melina Schools, MD;  Location: [facility];  Service: Orthopedics;  Laterality: N/A;  . colonscopy    . COLONSCOPY     . INSERTION PROSTATE RADIATION SEED  UNSURE OF DATE   DONE BY DR Karsten Ro   . PROSTATE BIOPSY    . ROTATOR CUFF REPAIR Right 03-13-12; 09-27-15  . TOTAL HIP ARTHROPLASTY Left 11/28/2018   Procedure: TOTAL HIP ARTHROPLASTY;  Surgeon: Earlie Server, MD;  Location: [facility];  Service: Orthopedics;  Laterality: Left;   Social History   Socioeconomic History  . Marital status: Single    Spouse name: Not on file  . Number of children: Not on file  . Years of education: Not on file  . Highest education level: Not on file  Occupational History  . Not on file  Tobacco Use  . Smoking status: Former Smoker    Quit date: 06/26/1971    Years since quitting: 48.9  . Smokeless tobacco: Never Used  Vaping Use  . Vaping Use: Never used  Substance and Sexual Activity  . Alcohol use: Yes     Comment: socially   . Drug use: No  . Sexual activity: Yes  Other Topics Concern  . Not on file  Social History Narrative   Right handed    Caffeine use: 1 cup coffee per day   Soda- not on a regular basis   Lives with mother, sister, brother, and nephew.   Social Determinants of Health   Financial Resource Strain:   . Difficulty of Paying Living Expenses: Not on file  Food Insecurity:   . Worried About Charity fundraiser in the Last Year: Not on file  . Ran Out of Food in the Last Year: Not on file  Transportation Needs:   . Lack of Transportation (Medical): Not on file  . Lack of Transportation (Non-Medical): Not on file  Physical Activity:   . Days of Exercise per Week: Not on file  . Minutes of Exercise per Session: Not on file  Stress:   . Feeling of Stress : Not on file  Social Connections:   . Frequency of Communication with Friends and Family: Not on file  . Frequency of Social Gatherings with Friends and Family: Not on file  . Attends Religious Services: Not on file  . Active Member of Clubs or Organizations: Not on file  . Attends Archivist Meetings: Not on file  . Marital Status: Not on file   Family History  Problem Relation Age of Onset  . Healthy Mother   . Healthy Father   . Stroke Sister   . Cancer Sister        breast   Allergies  Allergen Reactions  . Hydrocodone-Acetaminophen Other (See Comments)  . Hydrocodone Palpitations   Prior to Admission medications   Medication Sig Start Date End Date Taking? Authorizing Provider  acetaminophen (TYLENOL) 650 MG CR tablet Take 650-1,300 mg by mouth every 8 (eight) hours as needed for pain.    [provider]  amLODipine (NORVASC) 5 MG tablet Take 5 mg by mouth every evening.    [provider]  atorvastatin (LIPITOR) 10 MG tablet Take 10 mg by mouth daily.    [provider]  losartan (COZAAR) 100 MG tablet Take 100 mg by mouth daily.    [provider]   magnesium oxide (MAG-OX) 400 MG tablet Take 400 mg by mouth daily.    [provider]  Melatonin 10 MG CAPS Take 10-20 mg by mouth at bedtime.    [provider]  metoprolol succinate (TOPROL-XL) 25 MG 24 hr tablet TAKE 1 TABLET BY MOUTH EVERY DAY 05/09/20   Imogene Burn, PA-C  Multiple Vitamins-Minerals (MULTI FOR HIM 50+) TABS Take 1 tablet by mouth daily.    [provider]  Nutritional Supplements (IMMUNE ENHANCE) TABS Take 1 tablet by mouth daily.    [provider]     Positive ROS: Otherwise negative  All other systems have been reviewed and were otherwise negative with the exception of those mentioned in the HPI and as above.  Physical Exam: Constitutional: Alert, well-appearing, no acute distress Ears: External ears without lesions or tenderness. Ear canals are clear bilaterally.  TMs are clear bilaterally with good mobility on pneumatic otoscopy.  Auscultation of the ears revealed no objective tinnitus..  Nasal: External nose without lesions. Clear nasal passages Oral: Lips and gums without lesions. Tongue and palate mucosa without lesions. Posterior oropharynx clear. Neck: No palpable adenopathy or masses Respiratory: Breathing comfortably  Skin: No facial/neck lesions or rash noted.  Procedures  Assessment: Tinnitus Mild SNHL.  Plan: Briefly reviewed with patient concerning limited treatment options for tinnitus.  Discussed with him concerning using masking noise when the tinnitus is bothersome.  If the tinnitus becomes worse I did discuss possibility of being evaluated at the tinnitus clinic at [facility]. I gave him samples of Lipo flavonoid to try although he has already tried this without much help.   Radene Journey, MD   CC:

## 2020-05-26 ENCOUNTER — Ambulatory Visit: Payer: Medicare HMO | Admitting: Podiatry

## 2020-05-26 ENCOUNTER — Encounter (INDEPENDENT_AMBULATORY_CARE_PROVIDER_SITE_OTHER): Payer: Self-pay

## 2020-06-14 DIAGNOSIS — H2513 Age-related nuclear cataract, bilateral: Secondary | ICD-10-CM | POA: Diagnosis not present

## 2020-06-14 DIAGNOSIS — H40033 Anatomical narrow angle, bilateral: Secondary | ICD-10-CM | POA: Diagnosis not present

## 2020-06-28 DIAGNOSIS — N182 Chronic kidney disease, stage 2 (mild): Secondary | ICD-10-CM | POA: Diagnosis not present

## 2020-06-28 DIAGNOSIS — Z8546 Personal history of malignant neoplasm of prostate: Secondary | ICD-10-CM | POA: Diagnosis not present

## 2020-06-28 DIAGNOSIS — E7800 Pure hypercholesterolemia, unspecified: Secondary | ICD-10-CM | POA: Diagnosis not present

## 2020-06-28 DIAGNOSIS — K2970 Gastritis, unspecified, without bleeding: Secondary | ICD-10-CM | POA: Diagnosis not present

## 2020-06-28 DIAGNOSIS — H9319 Tinnitus, unspecified ear: Secondary | ICD-10-CM | POA: Diagnosis not present

## 2020-06-28 DIAGNOSIS — I700 Atherosclerosis of aorta: Secondary | ICD-10-CM | POA: Diagnosis not present

## 2020-06-28 DIAGNOSIS — I10 Essential (primary) hypertension: Secondary | ICD-10-CM | POA: Diagnosis not present

## 2020-07-03 ENCOUNTER — Emergency Department (HOSPITAL_COMMUNITY): Payer: Medicare HMO

## 2020-07-03 ENCOUNTER — Encounter (HOSPITAL_COMMUNITY): Payer: Self-pay | Admitting: Emergency Medicine

## 2020-07-03 ENCOUNTER — Other Ambulatory Visit: Payer: Self-pay

## 2020-07-03 ENCOUNTER — Emergency Department (HOSPITAL_COMMUNITY)
Admission: EM | Admit: 2020-07-03 | Discharge: 2020-07-03 | Disposition: A | Discharge status: Home / Self Care | Payer: Medicare HMO | Attending: Emergency Medicine | Admitting: Emergency Medicine

## 2020-07-03 DIAGNOSIS — Z79899 Other long term (current) drug therapy: Secondary | ICD-10-CM | POA: Insufficient documentation

## 2020-07-03 DIAGNOSIS — N182 Chronic kidney disease, stage 2 (mild): Secondary | ICD-10-CM | POA: Insufficient documentation

## 2020-07-03 DIAGNOSIS — R001 Bradycardia, unspecified: Secondary | ICD-10-CM | POA: Diagnosis not present

## 2020-07-03 DIAGNOSIS — Z87891 Personal history of nicotine dependence: Secondary | ICD-10-CM | POA: Diagnosis not present

## 2020-07-03 DIAGNOSIS — R42 Dizziness and giddiness: Secondary | ICD-10-CM | POA: Diagnosis not present

## 2020-07-03 DIAGNOSIS — Z8546 Personal history of malignant neoplasm of prostate: Secondary | ICD-10-CM | POA: Insufficient documentation

## 2020-07-03 DIAGNOSIS — I129 Hypertensive chronic kidney disease with stage 1 through stage 4 chronic kidney disease, or unspecified chronic kidney disease: Secondary | ICD-10-CM | POA: Insufficient documentation

## 2020-07-03 DIAGNOSIS — Z96642 Presence of left artificial hip joint: Secondary | ICD-10-CM | POA: Insufficient documentation

## 2020-07-03 DIAGNOSIS — R000 Tachycardia, unspecified: Secondary | ICD-10-CM | POA: Diagnosis not present

## 2020-07-03 LAB — DIFFERENTIAL
Abs Immature Granulocytes: 0.01 10*3/uL (ref 0.00–0.07)
Basophils Absolute: 0 10*3/uL (ref 0.0–0.1)
Basophils Relative: 1 %
Eosinophils Absolute: 0.1 10*3/uL (ref 0.0–0.5)
Eosinophils Relative: 3 %
Immature Granulocytes: 0 %
Lymphocytes Relative: 27 %
Lymphs Abs: 1.1 10*3/uL (ref 0.7–4.0)
Monocytes Absolute: 0.3 10*3/uL (ref 0.1–1.0)
Monocytes Relative: 6 %
Neutro Abs: 2.6 10*3/uL (ref 1.7–7.7)
Neutrophils Relative %: 63 %

## 2020-07-03 LAB — PROTIME-INR
INR: 1 (ref 0.8–1.2)
Prothrombin Time: 13.2 seconds (ref 11.4–15.2)

## 2020-07-03 LAB — CBC
HCT: 35.8 % — ABNORMAL LOW (ref 39.0–52.0)
Hemoglobin: 12.8 g/dL — ABNORMAL LOW (ref 13.0–17.0)
MCH: 29.2 pg (ref 26.0–34.0)
MCHC: 35.8 g/dL (ref 30.0–36.0)
MCV: 81.5 fL (ref 80.0–100.0)
Platelets: 165 10*3/uL (ref 150–400)
RBC: 4.39 MIL/uL (ref 4.22–5.81)
RDW: 14.5 % (ref 11.5–15.5)
WBC: 4.1 10*3/uL (ref 4.0–10.5)
nRBC: 0 % (ref 0.0–0.2)

## 2020-07-03 LAB — COMPREHENSIVE METABOLIC PANEL
ALT: 20 U/L (ref 0–44)
AST: 23 U/L (ref 15–41)
Albumin: 4.2 g/dL (ref 3.5–5.0)
Alkaline Phosphatase: 61 U/L (ref 38–126)
Anion gap: 9 (ref 5–15)
BUN: 12 mg/dL (ref 8–23)
CO2: 24 mmol/L (ref 22–32)
Calcium: 9.4 mg/dL (ref 8.9–10.3)
Chloride: 105 mmol/L (ref 98–111)
Creatinine, Ser: 1.43 mg/dL — ABNORMAL HIGH (ref 0.61–1.24)
GFR, Estimated: 52 mL/min — ABNORMAL LOW (ref >60)
Glucose, Bld: 176 mg/dL — ABNORMAL HIGH (ref 70–99)
Potassium: 3.5 mmol/L (ref 3.5–5.1)
Sodium: 138 mmol/L (ref 135–145)
Total Bilirubin: 1.2 mg/dL (ref 0.3–1.2)
Total Protein: 6.8 g/dL (ref 6.5–8.1)

## 2020-07-03 LAB — APTT: aPTT: 28 seconds (ref 24–36)

## 2020-07-03 MED ORDER — SODIUM CHLORIDE 0.9% FLUSH: 3.0000 mL | Freq: Once | INTRAVENOUS | Status: DC

## 2020-07-03 NOTE — Discharge Instructions (Signed)
You are seen in the ER for dizziness.  We recommend that you follow-up with your primary care doctor to see if you need work-up for mini stroke and also if you need further work-up for your low heart rate.  Although we noticed that your heart rate went as low as 48 in the ER, it was in a normal rhythm and the EKG actually had a heart rate in the 70s.  Return to the ER if you start having constant dizziness, abnormal balance, slurred speech, focal weakness/numbness, vision change.

## 2020-07-03 NOTE — ED Provider Notes (Signed)
[facility] EMERGENCY DEPARTMENT Provider Note   CSN: [id number] Arrival date & time: 07/03/20  0859     History Chief Complaint  Patient presents with  . Dizziness    Dennis Huffman is a 70 y.o. male.  HPI     70 year old male comes in a chief complaint of dizziness.  He has history of bradycardia, CKD, hyperlipidemia, hypertension.  He reports that this morning when he got up from his bed he was dizzy.  The dizziness lasted for about a minute.  He describes the dizziness as feeling swimmy headed.  No near fainting or fainting, no vertiginous symptoms.  He has tinnitus at the moment, but has not had any dizziness associated with it.  Patient denies any associated focal numbness, weakness, slurred speech, vision change.  No repeat episodes.  Patient just wanted to make sure that everything was fine.  He also wants [place] to review his heart rate.  He has been told that his heart rate is often in the 40s.  No new medications.   Past Medical History:  Diagnosis Date  . Arthritis    cervical spondylotic, radiculopathy  . Bradycardia    a. HR previously reported as 40 at PCP's office; event monitor 2017 showed nocturnal bradycardia but no daytime findings.  . CKD (chronic kidney disease), stage II   . Hypercholesteremia   . Hypertension   . Premature atrial contractions   . Prostate cancer ([place])   . PVC's (premature ventricular contractions)     Patient Active Problem List   Diagnosis Date Noted  . Degenerative joint disease of left hip 11/28/2018  . Prostate cancer ([place]) 08/29/2016  . Neck pain 05/12/2015  . Bradycardia 04/21/2014  . Hyperlipidemia 04/21/2014    Past Surgical History:  Procedure Laterality Date  . ANTERIOR CERVICAL DECOMP/DISCECTOMY FUSION N/A 05/12/2015   Procedure: ANTERIOR CERVICAL DECOMPRESSION/DISCECTOMY FUSION C3 - C6 3 LEVELS;  Surgeon: Melina Schools, MD;  Location: [facility];  Service: Orthopedics;  Laterality: N/A;  . colonscopy     . COLONSCOPY     . INSERTION PROSTATE RADIATION SEED  UNSURE OF DATE   DONE BY DR Karsten Ro   . PROSTATE BIOPSY    . ROTATOR CUFF REPAIR Right 03-13-12; 09-27-15  . TOTAL HIP ARTHROPLASTY Left 11/28/2018   Procedure: TOTAL HIP ARTHROPLASTY;  Surgeon: Earlie Server, MD;  Location: [facility];  Service: Orthopedics;  Laterality: Left;       Family History  Problem Relation Age of Onset  . Healthy Mother   . Healthy Father   . Stroke Sister   . Cancer Sister        breast    Social History   Tobacco Use  . Smoking status: Former Smoker    Quit date: 06/26/1971    Years since quitting: 49.0  . Smokeless tobacco: Never Used  Vaping Use  . Vaping Use: Never used  Substance Use Topics  . Alcohol use: Yes    Comment: socially   . Drug use: No    Home Medications Prior to Admission medications   Medication Sig Start Date End Date Taking? Authorizing Provider  acetaminophen (TYLENOL) 650 MG CR tablet Take 650-1,300 mg by mouth every 8 (eight) hours as needed for pain.    [provider]  amLODipine (NORVASC) 5 MG tablet Take 5 mg by mouth every evening.    [provider]  atorvastatin (LIPITOR) 10 MG tablet Take 10 mg by mouth daily.    [provider]  losartan (COZAAR) 100 MG tablet Take 100 mg by mouth daily.    [provider]  magnesium oxide (MAG-OX) 400 MG tablet Take 400 mg by mouth daily.    [provider]  Melatonin 10 MG CAPS Take 10-20 mg by mouth at bedtime.    [provider]  metoprolol succinate (TOPROL-XL) 25 MG 24 hr tablet TAKE 1 TABLET BY MOUTH EVERY DAY 05/09/20   Imogene Burn, PA-C  Multiple Vitamins-Minerals (MULTI FOR HIM 50+) TABS Take 1 tablet by mouth daily.    [provider]  Nutritional Supplements (IMMUNE ENHANCE) TABS Take 1 tablet by mouth daily.    [provider]  pantoprazole (PROTONIX) 40 MG tablet Take 40 mg by mouth daily. 06/28/20   [provider]    Allergies     Hydrocodone-acetaminophen, Ramipril, and Hydrocodone  Review of Systems   Review of Systems  Constitutional: Positive for activity change.  Eyes: Negative for visual disturbance.  Respiratory: Negative for shortness of breath.   Cardiovascular: Negative for chest pain.  Gastrointestinal: Negative for nausea and vomiting.  Neurological: Positive for dizziness. Negative for tremors, seizures, syncope, facial asymmetry, speech difficulty, weakness, numbness and headaches.  All other systems reviewed and are negative.   Physical Exam Updated Vital Signs BP 122/74   Pulse (!) 43   Temp 98.3 F (36.8 C) (Oral)   Resp 18   Ht 5\' 11"  (1.803 m)   Wt 103.4 kg   SpO2 100%   BMI 31.80 kg/m   Physical Exam Vitals and nursing note reviewed.  Constitutional:      Appearance: He is well-developed.  HENT:     Head: Atraumatic.  Cardiovascular:     Rate and Rhythm: Normal rate.  Pulmonary:     Effort: Pulmonary effort is normal.  Musculoskeletal:     Cervical back: Neck supple.  Skin:    General: Skin is warm.  Neurological:     Mental Status: He is alert and oriented to person, place, and time.     Comments: Cerebellar exam is normal (finger to nose) Sensory exam normal for bilateral upper and lower extremities - and patient is able to discriminate between sharp and dull. Motor exam is 4+/5      ED Results / Procedures / Treatments   Labs (all labs ordered are listed, but only abnormal results are displayed) Labs Reviewed  CBC - Abnormal; Notable for the following components:      Result Value   Hemoglobin 12.8 (*)    HCT 35.8 (*)    All other components within normal limits  COMPREHENSIVE METABOLIC PANEL - Abnormal; Notable for the following components:   Glucose, Bld 176 (*)    Creatinine, Ser 1.43 (*)    GFR, Estimated 52 (*)    All other components within normal limits  PROTIME-INR  APTT  DIFFERENTIAL    EKG EKG Interpretation  Date/Time:  Sunday July 03 2020 09:10:07 EST Ventricular Rate:  71 PR Interval:  186 QRS Duration: 92 QT Interval:  416 QTC Calculation: 452 R Axis:   4 Text Interpretation: Sinus rhythm with occasional Premature ventricular complexes Otherwise normal ECG No acute changes No significant change since last tracing Confirmed by [name] ([id number]) on 07/03/2020 2:58:57 PM   Radiology CT HEAD WO CONTRAST  Result Date: 07/03/2020 CLINICAL DATA:  Dizziness. Memory disturbance. Symptoms over the last week. EXAM: CT HEAD WITHOUT CONTRAST TECHNIQUE: Contiguous axial images were obtained from the base  of the skull through the vertex without intravenous contrast. COMPARISON:  MRI 06/29/2019 FINDINGS: Brain: Mild age related volume loss. No evidence of old or acute focal infarction, mass lesion, hemorrhage, hydrocephalus or extra-axial collection. Vascular: There is atherosclerotic calcification of the major vessels at the base of the brain. Skull: Normal Sinuses/Orbits: Paranasal sinuses are clear. No acute orbital pathology. Bilateral medial orbital blowout fractures in the distant past. Other: No fluid in the middle ears or mastoids. IMPRESSION: 1. No acute or reversible finding. Mild age related volume loss. Atherosclerotic calcification of the major vessels at the base of the brain. 2. Bilateral medial orbital blowout fractures in the distant past. Electronically Signed   By: Nelson Chimes M.D.   On: 07/03/2020 10:04    Procedures Procedures (including critical care time)  Medications Ordered in ED Medications  sodium chloride flush (NS) 0.9 % injection 3 mL (has no administration in time range)    ED Course  I have reviewed the triage vital signs and the nursing notes.  Pertinent labs & imaging results that were available during my care of the patient were reviewed by me and considered in my medical decision making (see chart for details).    MDM Rules/Calculators/A&P                          70 year old comes in w/  chief complaint of dizziness.  It appears that patient got up from the bed and had a minute long dizzy spell.  He has not had any recurrent dizziness since then and denies any other focal neuro deficits.  No neck pain, headaches.  I ambulated the patient in the hallway and he had no ataxia.  I suspect he had orthostatic dizziness.  His heart rate has dropped in the 40s in the past as well, which could have contributed.  He does not have any new medications on board.  He is on metoprolol.  I advised patient to follow-up with his PCP for evaluation of his heart rate.  It appears that he has documented known bradycardia.  He will discuss with them if the medications he is on, particularly the beta-blocker is fine.  He will also discussed with them if he needs TIA work-up.   Strict ER return precautions have been discussed, and patient is agreeing with the plan and is comfortable with the workup done and the recommendations from the ER.  He will return to the ER if he starts having any new neurologic symptoms including focal numbness, weakness, slurred speech, vision change or the dizziness becomes constant and is associated with nausea or vomiting.   Final Clinical Impression(s) / ED Diagnoses Final diagnoses:  Orthostatic dizziness  Sinus bradycardia    Rx / DC Orders ED Discharge Orders    None       [name], MD 07/03/20 1506

## 2020-07-03 NOTE — ED Notes (Signed)
DC instructions reviewed with pt. Pt verbalized understanding.  PT DC.  

## 2020-07-03 NOTE — ED Triage Notes (Signed)
Pt reports feeling "swimmy headed" after taking medication for "memory problems" approx 1 week ago.  States he stopped the medication and sent it back.  Reports dizziness has returned for the last 2-3 days.  Denies weakness/numbness.  No arm drift.

## 2020-07-05 DIAGNOSIS — H903 Sensorineural hearing loss, bilateral: Secondary | ICD-10-CM | POA: Diagnosis not present

## 2020-07-06 DIAGNOSIS — H9319 Tinnitus, unspecified ear: Secondary | ICD-10-CM | POA: Diagnosis not present

## 2020-07-06 DIAGNOSIS — R42 Dizziness and giddiness: Secondary | ICD-10-CM | POA: Diagnosis not present

## 2020-07-06 DIAGNOSIS — I10 Essential (primary) hypertension: Secondary | ICD-10-CM | POA: Diagnosis not present

## 2020-07-12 DIAGNOSIS — R109 Unspecified abdominal pain: Secondary | ICD-10-CM | POA: Diagnosis not present

## 2020-07-18 DIAGNOSIS — M25511 Pain in right shoulder: Secondary | ICD-10-CM | POA: Diagnosis not present

## 2020-07-27 DIAGNOSIS — H903 Sensorineural hearing loss, bilateral: Secondary | ICD-10-CM | POA: Diagnosis not present

## 2020-08-17 DIAGNOSIS — H5213 Myopia, bilateral: Secondary | ICD-10-CM | POA: Diagnosis not present

## 2020-08-17 NOTE — Progress Notes (Signed)
Cardiology Office Note   Date:  08/18/2020   ID:  Dennis Huffman, DOB Dec 30, 1950, MRN [id number]  PCP:  [name], MD    No chief complaint on file.  PACs  Wt Readings from Last 3 Encounters:  08/18/20 217 lb 6.4 oz (98.6 kg)  07/03/20 228 lb (103.4 kg)  02/23/20 230 lb (104.3 kg)       History of Present Illness: Dennis Huffman is a 70 y.o. male  who had arm pain and was given hydrocodonein 2014. At that time, he he noticed some palpitations so he stopped the medicine. The palpitations then resolved. ECG was done with Dr. Lysle Rubens and his HR was slow, per his report 40 bpm. Due to his age, he was referred here to be evaluated for bradycardia and was seen in 2015.He had a negative w/u at that time.  He no longer exercises regularly. He had a hip replacement in 2016, neck operation in 2016 and then a right shoulder operation Having some occasional skipped beats sensation. It has decreased. He avoids the pain meds now.   He had Prostate Cancer and had radiation. Testosterone was not recommended.    7/21 CTA aorta: "Greatest diameter of the ascending aorta on this non gated CT chest is estimated 37 mm. No comparison cross-sectional imaging available.  Small hiatal hernia  Minimal atherosclerosis of the thoracic aorta. Aortic Atherosclerosis (ICD10-I70.0)."  In early 2022, he has lost weight with portion control.   Denies : Chest pain. Leg edema. Nitroglycerin use. Orthopnea. Paroxysmal nocturnal dyspnea. Shortness of breath. Syncope.   Rare skipped beats lasting 1-2 seconds.   Had some dizziness in Jan 2022 after trying a memory supplement he ordered from a TV ad.  He stopped the medicine and sent it back to the company.   He has not had COVID.  He has been vaccinated against COVID.   Past Medical History:  Diagnosis Date  . Arthritis    cervical spondylotic, radiculopathy  . Bradycardia    a. HR previously reported as 40 at PCP's office;  event monitor 2017 showed nocturnal bradycardia but no daytime findings.  . CKD (chronic kidney disease), stage II   . Hypercholesteremia   . Hypertension   . Premature atrial contractions   . Prostate cancer ([place])   . PVC's (premature ventricular contractions)     Past Surgical History:  Procedure Laterality Date  . ANTERIOR CERVICAL DECOMP/DISCECTOMY FUSION N/A 05/12/2015   Procedure: ANTERIOR CERVICAL DECOMPRESSION/DISCECTOMY FUSION C3 - C6 3 LEVELS;  Surgeon: Melina Schools, MD;  Location: [facility];  Service: Orthopedics;  Laterality: N/A;  . colonscopy    . COLONSCOPY     . INSERTION PROSTATE RADIATION SEED  UNSURE OF DATE   DONE BY DR Karsten Ro   . PROSTATE BIOPSY    . ROTATOR CUFF REPAIR Right 03-13-12; 09-27-15  . TOTAL HIP ARTHROPLASTY Left 11/28/2018   Procedure: TOTAL HIP ARTHROPLASTY;  Surgeon: Earlie Server, MD;  Location: [facility];  Service: Orthopedics;  Laterality: Left;     Current Outpatient Medications  Medication Sig Dispense Refill  . acetaminophen (TYLENOL) 650 MG CR tablet Take 650-1,300 mg by mouth every 8 (eight) hours as needed for pain.    [name] amLODipine (NORVASC) 5 MG tablet Take 5 mg by mouth every evening.    [name] atorvastatin (LIPITOR) 10 MG tablet Take 10 mg by mouth daily.    [name] losartan (COZAAR) 100 MG tablet Take 100 mg by mouth daily.    [name]  magnesium oxide (MAG-OX) 400 MG tablet Take 400 mg by mouth daily.    . Melatonin 10 MG CAPS Take 10-20 mg by mouth at bedtime.    . metoprolol succinate (TOPROL-XL) 25 MG 24 hr tablet TAKE 1 TABLET BY MOUTH EVERY DAY 90 tablet 1  . Multiple Vitamins-Minerals (MULTI FOR HIM 50+) TABS Take 1 tablet by mouth daily.    . Nutritional Supplements (IMMUNE ENHANCE) TABS Take 1 tablet by mouth daily.     No current facility-administered medications for this visit.    Allergies:   Hydrocodone-acetaminophen, Ramipril, and Hydrocodone    Social History:  The patient  reports that he quit smoking about 49 years ago. He has never  used smokeless tobacco. He reports current alcohol use. He reports that he does not use drugs.   Family History:  The patient's family history includes Cancer in his sister; Healthy in his father and mother; Stroke in his sister.    ROS:  Please see the history of present illness.   Otherwise, review of systems are positive for intentional weight loss.   All other systems are reviewed and negative.    PHYSICAL EXAM: VS:  BP 118/62   Pulse 71   Ht 5\' 11"  (1.803 m)   Wt 217 lb 6.4 oz (98.6 kg)   SpO2 98%   BMI 30.32 kg/m  , BMI Body mass index is 30.32 kg/m. GEN: Well nourished, well developed, in no acute distress  HEENT: normal  Neck: no JVD, carotid bruits, or masses Cardiac: RRR; no murmurs, rubs, or gallops,no edema  Respiratory:  clear to auscultation bilaterally, normal work of breathing GI: soft, nontender, nondistended, + BS MS: no deformity or atrophy  Skin: warm and dry, no rash Neuro:  Strength and sensation are intact Psych: euthymic mood, full affect   EKG:   The ekg ordered in Jan 2022 (ER) demonstrates NSR, PVC   Recent Labs: 07/03/2020: ALT 20; BUN 12; Creatinine, Ser 1.43; Hemoglobin 12.8; Platelets 165; Potassium 3.5; Sodium 138   Lipid Panel No results found for: CHOL, TRIG, HDL, CHOLHDL, VLDL, LDLCALC, LDLDIRECT   Other studies Reviewed: Additional studies/ records that were reviewed today with results demonstrating: labs reviewed.   ASSESSMENT AND PLAN:  1. PAC/PVC:  This may contribute to his "bradycardia" if pulse is checked at the wrist. For now, on low dose metoprolol.  He wants to stop it and see if premature beats get worse.  He can take it as needed for palpitations. He has some at home already. Given dizziness, we can try him off of the medicine.  Stay well hydrated.  He takes supplements 2. Hyperlipidemia:  LDL 72.  Increase fiber intake.  Avoid processed foods.  3. HTN: The current medical regimen is effective;  continue present plan and  medications. 4. He reports test that sounds like an EGD followed with treatment with antibiotic.  Will check with GI if he can take aspirin 81 mg daily. ? H. Pylori.   Current medicines are reviewed at length with the patient today.  The patient concerns regarding his medicines were addressed.  The following changes have been made:  No change  Labs/ tests ordered today include:  No orders of the defined types were placed in this encounter.   Recommend 150 minutes/week of aerobic exercise Low fat, low carb, high fiber diet recommended  Disposition:   FU in 1 year   Signed, Larae Grooms, MD  08/18/2020 10:51 AM    [facility] [address], [address] [place], [place]  [address] Phone: [phone]; Fax: [phone]

## 2020-08-18 ENCOUNTER — Ambulatory Visit: Payer: Medicare HMO | Admitting: Interventional Cardiology

## 2020-08-18 ENCOUNTER — Other Ambulatory Visit: Payer: Self-pay

## 2020-08-18 ENCOUNTER — Encounter: Payer: Self-pay | Admitting: Interventional Cardiology

## 2020-08-18 VITALS — BP 118/62 | HR 71 | Ht 71.0 in | Wt 217.4 lb

## 2020-08-18 DIAGNOSIS — I491 Atrial premature depolarization: Secondary | ICD-10-CM | POA: Diagnosis not present

## 2020-08-18 DIAGNOSIS — E782 Mixed hyperlipidemia: Secondary | ICD-10-CM

## 2020-08-18 DIAGNOSIS — I493 Ventricular premature depolarization: Secondary | ICD-10-CM

## 2020-08-18 DIAGNOSIS — I10 Essential (primary) hypertension: Secondary | ICD-10-CM | POA: Diagnosis not present

## 2020-08-18 MED ORDER — METOPROLOL SUCCINATE ER 25 MG PO TB24: 25.0000 mg | ORAL_TABLET | Freq: Every day | ORAL | 11 refills | Status: DC | PRN

## 2020-08-18 NOTE — Patient Instructions (Signed)
Medication Instructions:   Your physician has recommended you make the following change in your medication: Decrease metoprolol to 25 mg daily as needed  *If you need a refill on your cardiac medications before your next appointment, please call your pharmacy*   Lab Work: none If you have labs (blood work) drawn today and your tests are completely normal, you will receive your results only by: [name] MyChart Message (if you have MyChart) OR . A paper copy in the mail If you have any lab test that is abnormal or we need to change your treatment, we will call you to review the results.   Testing/Procedures: none   Follow-Up: At [facility], you and your health needs are our priority.  As part of our continuing mission to provide you with exceptional heart care, we have created designated Provider Care Teams.  These Care Teams include your primary Cardiologist (physician) and Advanced Practice Providers (APPs -  Physician Assistants and Nurse Practitioners) who all work together to provide you with the care you need, when you need it.  We recommend signing up for the patient portal called "MyChart".  Sign up information is provided on this After Visit Summary.  MyChart is used to connect with patients for Virtual Visits (Telemedicine).  Patients are able to view lab/test results, encounter notes, upcoming appointments, etc.  Non-urgent messages can be sent to your provider as well.   To learn more about what you can do with MyChart, go to [email or web address].    Your next appointment:   12 month(s)  The format for your next appointment:   In Person  Provider:   You may see Larae Grooms, MD or one of the following Advanced Practice Providers on your designated Care Team:    Melina Copa, PA-C  Ermalinda Barrios, PA-C    Other Instructions  High-Fiber Eating Plan Fiber, also called dietary fiber, is a type of carbohydrate. It is found foods such as fruits, vegetables, whole  grains, and beans. A high-fiber diet can have many health benefits. Your health care provider may recommend a high-fiber diet to help:  Prevent constipation. Fiber can make your bowel movements more regular.  Lower your cholesterol.  Relieve the following conditions: ? Inflammation of veins in the anus (hemorrhoids). ? Inflammation of specific areas of the digestive tract (uncomplicated diverticulosis). ? A problem of the large intestine, also called the colon, that sometimes causes pain and diarrhea (irritable bowel syndrome, or IBS).  Prevent overeating as part of a weight-loss plan.  Prevent heart disease, type 2 diabetes, and certain cancers. What are tips for following this plan? Reading food labels  Check the nutrition facts label on food products for the amount of dietary fiber. Choose foods that have 5 grams of fiber or more per serving.  The goals for recommended daily fiber intake include: ? Men (age 10 or younger): 34-38 g. ? Men (over age 84): 28-34 g. ? Women (age 64 or younger): 25-28 g. ? Women (over age 29): 22-25 g. Your daily fiber goal is _____________ g.   Shopping  Choose whole fruits and vegetables instead of processed forms, such as apple juice or applesauce.  Choose a wide variety of high-fiber foods such as avocados, lentils, oats, and kidney beans.  Read the nutrition facts label of the foods you choose. Be aware of foods with added fiber. These foods often have high sugar and sodium amounts per serving. Cooking  Use whole-grain flour for baking and cooking.  Cook with  brown rice instead of white rice. Meal planning  Start the day with a breakfast that is high in fiber, such as a cereal that contains 5 g of fiber or more per serving.  Eat breads and cereals that are made with whole-grain flour instead of refined flour or white flour.  Eat brown rice, bulgur wheat, or millet instead of white rice.  Use beans in place of meat in soups, salads, and  pasta dishes.  Be sure that half of the grains you eat each day are whole grains. General information  You can get the recommended daily intake of dietary fiber by: ? Eating a variety of fruits, vegetables, grains, nuts, and beans. ? Taking a fiber supplement if you are not able to take in enough fiber in your diet. It is better to get fiber through food than from a supplement.  Gradually increase how much fiber you consume. If you increase your intake of dietary fiber too quickly, you may have bloating, cramping, or gas.  Drink plenty of water to help you digest fiber.  Choose high-fiber snacks, such as berries, raw vegetables, nuts, and popcorn. What foods should I eat? Fruits Berries. Pears. Apples. Oranges. Avocado. Prunes and raisins. Dried figs. Vegetables Sweet potatoes. Spinach. Kale. Artichokes. Cabbage. Broccoli. Cauliflower. Green peas. Carrots. Squash. Grains Whole-grain breads. Multigrain cereal. Oats and oatmeal. Brown rice. Barley. Bulgur wheat. [place]. Quinoa. Bran muffins. Popcorn. Rye wafer crackers. Meats and other proteins Navy beans, kidney beans, and pinto beans. Soybeans. Split peas. Lentils. Nuts and seeds. Dairy Fiber-fortified yogurt. Beverages Fiber-fortified soy milk. Fiber-fortified orange juice. Other foods Fiber bars. The items listed above may not be a complete list of recommended foods and beverages. Contact a dietitian for more information. What foods should I avoid? Fruits Fruit juice. Cooked, strained fruit. Vegetables Fried potatoes. Canned vegetables. Well-cooked vegetables. Grains White bread. Pasta made with refined flour. White rice. Meats and other proteins Fatty cuts of meat. Fried chicken or fried fish. Dairy Milk. Yogurt. Cream cheese. Sour cream. Fats and oils Butters. Beverages Soft drinks. Other foods Cakes and pastries. The items listed above may not be a complete list of foods and beverages to avoid. Talk with your  dietitian about what choices are best for you. Summary  Fiber is a type of carbohydrate. It is found in foods such as fruits, vegetables, whole grains, and beans.  A high-fiber diet has many benefits. It can help to prevent constipation, lower blood cholesterol, aid weight loss, and reduce your risk of heart disease, diabetes, and certain cancers.  Increase your intake of fiber gradually. Increasing fiber too quickly may cause cramping, bloating, and gas. Drink plenty of water while you increase the amount of fiber you consume.  The best sources of fiber include whole fruits and vegetables, whole grains, nuts, seeds, and beans. This information is not intended to replace advice given to you by your health care provider. Make sure you discuss any questions you have with your health care provider. Document Revised: 10/15/2019 Document Reviewed: 10/15/2019 Elsevier Patient Education  2021 [employer].

## 2020-08-22 NOTE — Progress Notes (Signed)
OK to take aspirin 81 mg daily per GI.   JV

## 2020-08-24 ENCOUNTER — Telehealth: Payer: Self-pay | Admitting: *Deleted

## 2020-08-24 NOTE — Telephone Encounter (Signed)
OK to take aspirin 81 mg daily per GI.   JV  I placed call to patient and left message to call office

## 2020-08-30 NOTE — Telephone Encounter (Signed)
Left message to call office

## 2020-08-31 DIAGNOSIS — Z8546 Personal history of malignant neoplasm of prostate: Secondary | ICD-10-CM | POA: Diagnosis not present

## 2020-09-07 DIAGNOSIS — N5201 Erectile dysfunction due to arterial insufficiency: Secondary | ICD-10-CM | POA: Diagnosis not present

## 2020-09-07 DIAGNOSIS — C61 Malignant neoplasm of prostate: Secondary | ICD-10-CM | POA: Diagnosis not present

## 2020-09-15 ENCOUNTER — Ambulatory Visit: Payer: Medicare HMO | Admitting: Family Medicine

## 2020-09-15 ENCOUNTER — Encounter: Payer: Self-pay | Admitting: Family Medicine

## 2020-09-15 VITALS — BP 112/69 | HR 57 | Ht 71.0 in | Wt 220.0 lb

## 2020-09-15 DIAGNOSIS — F320 Major depressive disorder, single episode, mild: Secondary | ICD-10-CM

## 2020-09-15 DIAGNOSIS — F32A Depression, unspecified: Secondary | ICD-10-CM

## 2020-09-15 DIAGNOSIS — R413 Other amnesia: Secondary | ICD-10-CM | POA: Diagnosis not present

## 2020-09-15 DIAGNOSIS — F5104 Psychophysiologic insomnia: Secondary | ICD-10-CM

## 2020-09-15 NOTE — Progress Notes (Signed)
PATIENT: Dennis Huffman DOB: [birth year]  REASON FOR VISIT: follow up HISTORY FROM: patient  Chief Complaint  Patient presents with  . Follow-up    RM 2 alone Pt states memory is about the same      HISTORY OF PRESENT ILLNESS: 09/15/20 ALL:  Dennis Huffman returns for follow up for memory loss. Dennis Huffman feels that Dennis Huffman is doing fairly well. Memory is about the same. Dennis Huffman had an episode 06/2020 of feeling "out of it" for about a week after starting a new medications Dennis Huffman bought for memory loss on the internet. CT was unremarkable. MRI 06/2019 normal for age. Dennis Huffman denies difficulty driving. Dennis Huffman is able to perform ADL independently. Dennis Huffman is able to pay bills and shop independently.  Dennis Huffman lives with his mother, brother and sister. Dennis Huffman has most difficulty with short term memory. Dennis Huffman does have hearing loss.   09/30/2019 ALL:  [name] is a 70 y.o. male here today for follow up of subjective memory complaints. Wellbutrin was prescribed in 05/2019 due to suspicion of underlying depression and lack of concentration. MRI was unremarkable. Dennis Huffman reports that Dennis Huffman read the side effects associated with Wellbutrin and decided against taking it. Dennis Huffman does continue to have concerns of memory loss and insomnia but feels symptoms are unchanged from last visit. Dennis Huffman ordered a medication online (unsure of name) to help with his memory but does not feel that it has helped much at all. Dennis Huffman goes to bed around 11p. Dennis Huffman usually wakes up around 2am and around 5am. Dennis Huffman usually gets out of bed around 9am. Dennis Huffman does watch TV until Dennis Huffman falls asleep. Dennis Huffman is using melatonin at home and feels it helps. Dennis Huffman does not snore. Dennis Huffman denies excessive daytime sleepiness or dry mouth.    HISTORY: (copied from Dr Garth Bigness note on 06/01/2019)  I had the pleasure of seeing your patient, Dennis Huffman, at [facility] for neurologic consultation regarding his memory loss.  Dennis Huffman is a 70 year old man who has noticed difficulties with his memory over the past  couple months.    The onset was gradual over this time.  Of note, Dennis Huffman notes memory problems more than those around him.    Dennis Huffman recalls a few examples.   Dennis Huffman did not remember some appointments.    Dennis Huffman is driving well and has not been lost.    If Dennis Huffman gets a hint, Dennis Huffman will remember,   Dennis Huffman feels Dennis Huffman is focusing worse than Dennis Huffman used to.    Dennis Huffman denies much depression but does note mild apathy compared to earlier this year.Dennis Huffman  Dennis Huffman tries to get outdoors some but is not going to the gym as Dennis Huffman used to.     Dennis Huffman served in [place] and denies any PTSD symptoms.      Dennis Huffman is sleeping more poorly.   Dennis Huffman typically goes to bed at 11 pm but wakes up around 2 am and then has trouble getting back to sleep.  Melatonin has helped some.   Dennis Huffman denies being told that Dennis Huffman snores.      Dennis Huffman has had thyroid tests and B12 tests.  B12 was 317 and TSH 1.07.  Dennis Huffman notes some tingling in the left arm but no numbness in feet.   Gait is unchanged.     Dennis Huffman has a h/o of prostate cancer, treated with radiation.     Dennis Huffman has a 9th grade education.   Montreal Cognitive Assessment  06/01/2019  Visuospatial/ Executive (0/5) 4  Naming (0/3) 3  Attention: Read list of digits (0/2) 2  Attention: Read list of letters (0/1) 1  Attention: Serial 7 subtraction starting at 100 (0/3) 3  Language: Repeat phrase (0/2) 2  Language : Fluency (0/1) 0  Abstraction (0/2) 0  Delayed Recall (0/5) 0  Orientation (0/6) 5  Total 20  Adjusted Score (based on education) 21   (Serial threes were substituted for serial sevens due to education level)    REVIEW OF SYSTEMS: Out of a complete 14 system review of symptoms, the patient complains only of the following symptoms, memory loss, ringing of ears and all other reviewed systems are negative.  ALLERGIES: Allergies  Allergen Reactions  . Hydrocodone-Acetaminophen Other (See Comments)  . Ramipril     Other reaction(s): coughing  . Hydrocodone Palpitations    HOME MEDICATIONS: Outpatient Medications Prior to  Visit  Medication Sig Dispense Refill  . acetaminophen (TYLENOL) 650 MG CR tablet Take 650-1,300 mg by mouth every 8 (eight) hours as needed for pain.    Dennis Huffman amLODipine (NORVASC) 5 MG tablet Take 5 mg by mouth every evening.    Dennis Huffman atorvastatin (LIPITOR) 10 MG tablet Take 10 mg by mouth daily.    Dennis Huffman losartan (COZAAR) 100 MG tablet Take 100 mg by mouth daily.    . magnesium oxide (MAG-OX) 400 MG tablet Take 400 mg by mouth daily.    . Melatonin 10 MG CAPS Take 10-20 mg by mouth at bedtime.    . metoprolol succinate (TOPROL XL) 25 MG 24 hr tablet Take 1 tablet (25 mg total) by mouth daily as needed. 30 tablet 11  . Multiple Vitamins-Minerals (MULTI FOR HIM 50+) TABS Take 1 tablet by mouth daily.    . Nutritional Supplements (IMMUNE ENHANCE) TABS Take 1 tablet by mouth daily.     No facility-administered medications prior to visit.    PAST MEDICAL HISTORY: Past Medical History:  Diagnosis Date  . Arthritis    cervical spondylotic, radiculopathy  . Bradycardia    a. HR previously reported as 40 at PCP's office; event monitor 2017 showed nocturnal bradycardia but no daytime findings.  . CKD (chronic kidney disease), stage II   . Hypercholesteremia   . Hypertension   . Premature atrial contractions   . Prostate cancer ([place])   . PVC's (premature ventricular contractions)     PAST SURGICAL HISTORY: Past Surgical History:  Procedure Laterality Date  . ANTERIOR CERVICAL DECOMP/DISCECTOMY FUSION N/A 05/12/2015   Procedure: ANTERIOR CERVICAL DECOMPRESSION/DISCECTOMY FUSION C3 - C6 3 LEVELS;  Surgeon: Melina Schools, MD;  Location: [facility];  Service: Orthopedics;  Laterality: N/A;  . colonscopy    . COLONSCOPY     . INSERTION PROSTATE RADIATION SEED  UNSURE OF DATE   DONE BY DR Karsten Ro   . PROSTATE BIOPSY    . ROTATOR CUFF REPAIR Right 03-13-12; 09-27-15  . TOTAL HIP ARTHROPLASTY Left 11/28/2018   Procedure: TOTAL HIP ARTHROPLASTY;  Surgeon: Earlie Server, MD;  Location: [facility];  Service:  Orthopedics;  Laterality: Left;    FAMILY HISTORY: Family History  Problem Relation Age of Onset  . Healthy Mother   . Healthy Father   . Stroke Sister   . Cancer Sister        breast    SOCIAL HISTORY: Social History   Socioeconomic History  . Marital status: Single    Spouse name: Not on file  . Number of children: Not on file  . Years of education: Not on file  .  Highest education level: Not on file  Occupational History  . Not on file  Tobacco Use  . Smoking status: Former Smoker    Quit date: 06/26/1971    Years since quitting: 49.2  . Smokeless tobacco: Never Used  Vaping Use  . Vaping Use: Never used  Substance and Sexual Activity  . Alcohol use: Yes    Comment: socially   . Drug use: No  . Sexual activity: Yes  Other Topics Concern  . Not on file  Social History Narrative   Right handed    Caffeine use: 1 cup coffee per day   Soda- not on a regular basis   Lives with mother, sister, brother, and nephew.   Social Determinants of Health   Financial Resource Strain: Not on file  Food Insecurity: Not on file  Transportation Needs: Not on file  Physical Activity: Not on file  Stress: Not on file  Social Connections: Not on file  Intimate Partner Violence: Not on file      PHYSICAL EXAM  Vitals:   09/15/20 1052  BP: 112/69  Pulse: (!) 57  Weight: 220 lb (99.8 kg)  Height: 5\' 11"  (1.803 m)   Body mass index is 30.68 kg/m.  Generalized: Well developed, in no acute distress  Cardiology: normal rate and rhythm, no murmur noted Respiratory: clear to auscultation bilaterally  Neurological examination  Mentation: Alert oriented to time, place, history taking. Follows all commands speech and language fluent Cranial nerve II-XII: Pupils were equal round reactive to light. Extraocular movements were full, visual field were full  Motor: The motor testing reveals 5 over 5 strength of all 4 extremities. Good symmetric motor tone is noted throughout.   Cerebellar: normal finger to nose  Gait and station: Gait is normal.   DIAGNOSTIC DATA (LABS, IMAGING, TESTING) - I reviewed patient records, labs, notes, testing and imaging myself where available.  No flowsheet data found.   Lab Results  Component Value Date   WBC 4.1 07/03/2020   HGB 12.8 (L) 07/03/2020   HCT 35.8 (L) 07/03/2020   MCV 81.5 07/03/2020   PLT 165 07/03/2020      Component Value Date/Time   NA 138 07/03/2020 0917   NA 137 01/22/2020 0920   K 3.5 07/03/2020 0917   CL 105 07/03/2020 0917   CO2 24 07/03/2020 0917   GLUCOSE 176 (H) 07/03/2020 0917   BUN 12 07/03/2020 0917   BUN 10 01/22/2020 0920   CREATININE 1.43 (H) 07/03/2020 0917   CALCIUM 9.4 07/03/2020 0917   PROT 6.8 07/03/2020 0917   ALBUMIN 4.2 07/03/2020 0917   AST 23 07/03/2020 0917   ALT 20 07/03/2020 0917   ALKPHOS 61 07/03/2020 0917   BILITOT 1.2 07/03/2020 0917   GFRNONAA 52 (L) 07/03/2020 0917   GFRAA 63 01/22/2020 0920   No results found for: CHOL, HDL, LDLCALC, LDLDIRECT, TRIG, CHOLHDL No results found for: HGBA1C No results found for: VITAMINB12 No results found for: TSH   Montreal Cognitive Assessment  09/15/2020 09/30/2019 06/01/2019  Visuospatial/ Executive (0/5) 3 5 4   Naming (0/3) 3 2 3   Attention: Read list of digits (0/2) 2 2 2   Attention: Read list of letters (0/1) 1 1 1   Attention: Serial 7 subtraction starting at 100 (0/3) 0 1 3  Language: Repeat phrase (0/2) 2 2 2   Language : Fluency (0/1) 0 1 0  Abstraction (0/2) 2 2 0  Delayed Recall (0/5) 0 0 0  Orientation (0/6) 6 5 5  Total 19 21 20   Adjusted Score (based on education) - 22 21     ASSESSMENT AND PLAN 70 y.o. year old male  has a past medical history of Arthritis, Bradycardia, CKD (chronic kidney disease), stage II, Hypercholesteremia, Hypertension, Premature atrial contractions, Prostate cancer ([place]), and PVC's (premature ventricular contractions). here with     ICD-10-CM   1. Memory loss  R41.3 Ambulatory  referral to Neuropsychology  2. Mild depression ([place])  F32.0   3. Psychophysiological insomnia  F51.04      Mr Vanhoesen feels symptoms are stable. Dennis Huffman is sleeping well. Dennis Huffman does continue to have difficulty with short term memory loss. Dennis Huffman is using memory compensation strategies. We have reviewed CT and MRI results. We will refer him for formal neurocognitive testing. Dennis Huffman is not interested in starting any new medications at this time. Dennis Huffman was encouraged to stay well hydrated, focus on eating a healthy well balanced diet and getting regular physical and mental exercise. Dennis Huffman will follow up closely with PCP as directed. Dennis Huffman will return to see [place] as needed. Dennis Huffman verbalizes understanding and agreement with this plan.    Orders Placed This Encounter  Procedures  . Ambulatory referral to Neuropsychology    Referral Priority:   Routine    Referral Type:   Psychiatric    Referral Reason:   Specialty Services Required    Requested Specialty:   Psychology    Number of Visits Requested:   1     No orders of the defined types were placed in this encounter.     I spent 15 minutes with the patient. 50% of this time was spent counseling and educating patient on plan of care and medications.    Debbora Presto, FNP-C 09/15/2020, 12:16 PM [facility] [address] [place],  [address] [phone]

## 2020-09-15 NOTE — Patient Instructions (Addendum)
Below is our plan:  We will continue to monitor symptoms. Continue memory compensation strategies. I will refer you to neuropsychology for formal memory testing. They should be calling you soon to schedule. If you do not hear back in 2 weeks please call me and I will check on it for you.   Please make sure you are staying well hydrated. I recommend 50-60 ounces daily. Well balanced diet and regular exercise encouraged. Consistent sleep schedule with 6-8 hours recommended.   Please continue follow up with care team as directed.   Follow up pending memory evaluation.   You may receive a survey regarding today's visit. I encourage you to leave honest feed back as I do use this information to improve patient care. Thank you for seeing me today!    Memory Compensation Strategies  1. Use "WARM" strategy.  W= write it down  A= associate it  R= repeat it  M= make a mental note  2.   You can keep a Social worker.  Use a 3-ring notebook with sections for the following: calendar, important names and phone numbers,  medications, doctors' names/phone numbers, lists/reminders, and a section to journal what you did  each day.   3.    Use a calendar to write appointments down.  4.    Write yourself a schedule for the day.  This can be placed on the calendar or in a separate section of the Memory Notebook.  Keeping a  regular schedule can help memory.  5.    Use medication organizer with sections for each day or morning/evening pills.  You may need help loading it  6.    Keep a basket, or pegboard by the door.  Place items that you need to take out with you in the basket or on the pegboard.  You may also want to  include a message board for reminders.  7.    Use sticky notes.  Place sticky notes with reminders in a place where the task is performed.  For example: " turn off the  stove" placed by the stove, "lock the door" placed on the door at eye level, " take your medications" on  the  bathroom mirror or by the place where you normally take your medications.  8.    Use alarms/timers.  Use while cooking to remind yourself to check on food or as a reminder to take your medicine, or as a  reminder to make a call, or as a reminder to perform another task, etc.   Quality Sleep Information, Adult Quality sleep is important for your mental and physical health. It also improves your quality of life. Quality sleep means you:  Are asleep for most of the time you are in bed.  Fall asleep within 30 minutes.  Wake up no more than once a night.  Are awake for no longer than 20 minutes if you do wake up during the night. Most adults need 7-8 hours of quality sleep each night. How can poor sleep affect me? If you do not get enough quality sleep, you may have:  Mood swings.  Daytime sleepiness.  Confusion.  Decreased reaction time.  Sleep disorders, such as insomnia and sleep apnea.  Difficulty with: ? Solving problems. ? Coping with stress. ? Paying attention. These issues may affect your performance and productivity at work, school, and at home. Lack of sleep may also put you at higher risk for accidents, suicide, and risky behaviors. If you do not  get quality sleep you may also be at higher risk for several health problems, including:  Infections.  Type 2 diabetes.  Heart disease.  High blood pressure.  Obesity.  Worsening of long-term conditions, like arthritis, kidney disease, depression, Parkinson's disease, and epilepsy. What actions can I take to get more quality sleep?  Stick to a sleep schedule. Go to sleep and wake up at about the same time each day. Do not try to sleep less on weekdays and make up for lost sleep on weekends. This does not work.  Try to get about 30 minutes of exercise on most days. Do not exercise 2-3 hours before going to bed.  Limit naps during the day to 30 minutes or less.  Do not use any products that contain nicotine or  tobacco, such as cigarettes or e-cigarettes. If you need help quitting, ask your health care provider.  Do not drink caffeinated beverages for at least 8 hours before going to bed. Coffee, tea, and some sodas contain caffeine.  Do not drink alcohol close to bedtime.  Do not eat large meals close to bedtime.  Do not take naps in the late afternoon.  Try to get at least 30 minutes of sunlight every day. Morning sunlight is best.  Make time to relax before bed. Reading, listening to music, or taking a hot bath promotes quality sleep.  Make your bedroom a place that promotes quality sleep. Keep your bedroom dark, quiet, and at a comfortable room temperature. Make sure your bed is comfortable. Take out sleep distractions like TV, a computer, smartphone, and bright lights.  If you are lying awake in bed for longer than 20 minutes, get up and do a relaxing activity until you feel sleepy.  Work with your health care provider to treat medical conditions that may affect sleeping, such as: ? Nasal obstruction. ? Snoring. ? Sleep apnea and other sleep disorders.  Talk to your health care provider if you think any of your prescription medicines may cause you to have difficulty falling or staying asleep.  If you have sleep problems, talk with a sleep consultant. If you think you have a sleep disorder, talk with your health care provider about getting evaluated by a specialist.      Where to find more information  [place] website: https://sleepfoundation.org  National Heart, Lung, and [place] ([place]): http://www.saunders.info/.pdf  Centers for Disease Control and Prevention (CDC): LearningDermatology.pl Contact a health care provider if you:  Have trouble getting to sleep or staying asleep.  Often wake up very early in the morning and cannot get back to sleep.  Have daytime sleepiness.  Have daytime sleep attacks of suddenly  falling asleep and sudden muscle weakness (narcolepsy).  Have a tingling sensation in your legs with a strong urge to move your legs (restless legs syndrome).  Stop breathing briefly during sleep (sleep apnea).  Think you have a sleep disorder or are taking a medicine that is affecting your quality of sleep. Summary  Most adults need 7-8 hours of quality sleep each night.  Getting enough quality sleep is an important part of health and well-being.  Make your bedroom a place that promotes quality sleep and avoid things that may cause you to have poor sleep, such as alcohol, caffeine, smoking, and large meals.  Talk to your health care provider if you have trouble falling asleep or staying asleep. This information is not intended to replace advice given to you by your health care provider. Make  sure you discuss any questions you have with your health care provider. Document Revised: 09/18/2017 Document Reviewed: 09/18/2017 Elsevier Patient Education  2021 [employer].

## 2020-09-15 NOTE — Progress Notes (Signed)
I have read the note, and I agree with the clinical assessment and plan.  Kalani Sthilaire A. Kanita Delage, MD, PhD, FAAN Certified in Neurology, Clinical Neurophysiology, Sleep Medicine, Pain Medicine and Neuroimaging  [facility] [address], [address] [place], [place] [address] [phone]  

## 2020-09-19 NOTE — Progress Notes (Signed)
I have read the note, and I agree with the clinical assessment and plan.  Balin Vandegrift A. Valiant Dills, MD, PhD, FAAN Certified in Neurology, Clinical Neurophysiology, Sleep Medicine, Pain Medicine and Neuroimaging  [facility] [address], [address] [place], [place] [address] [phone]  

## 2020-09-26 MED ORDER — ASPIRIN EC 81 MG PO TBEC: 81.0000 mg | DELAYED_RELEASE_TABLET | Freq: Every day | ORAL | 3 refills | Status: DC

## 2020-09-26 NOTE — Telephone Encounter (Signed)
I spoke with patient and gave him message from Dr Irish Lack.  I advised him to get enteric coated 81 mg aspirin

## 2020-09-26 NOTE — Telephone Encounter (Signed)
Left message on home and cell numbers to call office

## 2020-09-28 DIAGNOSIS — M25511 Pain in right shoulder: Secondary | ICD-10-CM | POA: Diagnosis not present

## 2020-09-29 ENCOUNTER — Encounter: Payer: Self-pay | Admitting: Psychology

## 2020-10-24 DIAGNOSIS — M25511 Pain in right shoulder: Secondary | ICD-10-CM | POA: Diagnosis not present

## 2020-10-27 DIAGNOSIS — M25511 Pain in right shoulder: Secondary | ICD-10-CM | POA: Diagnosis not present

## 2020-11-01 ENCOUNTER — Encounter: Payer: Medicare HMO | Attending: Psychology | Admitting: Psychology

## 2020-11-01 ENCOUNTER — Encounter: Payer: Self-pay | Admitting: Psychology

## 2020-11-01 ENCOUNTER — Other Ambulatory Visit: Payer: Self-pay

## 2020-11-01 DIAGNOSIS — R413 Other amnesia: Secondary | ICD-10-CM | POA: Insufficient documentation

## 2020-11-01 NOTE — Progress Notes (Signed)
NEUROBEHAVIORAL STATUS EXAM   Name: Dennis Huffman Date of Birth: 10-09-1950 Date of Interview: 11/01/2020  Reason for Referral:  Dennis Huffman is a 70 y.o. male who is referred for neuropsychological evaluation by Debbora Presto, NP (Neurology) of [facility] due to subjective memory complaints. This patient is unaccompanied in the office and self-reports history.  History of Presenting Problem:  Dennis Huffman is a 70 y.o. male with a medical history remarkable for Arthritis, Bradycardia, CKD (chronic kidney disease), stage II, Hypercholesteremia, Hypertension, Premature atrial contractions, Prostate cancer ([place]), and PVC's (premature ventricular contractions) here  today for follow up of subjective memory complaints.   According to medical records, the patient was last seen by Debbora Presto, NP (Neurology) on 09/15/20. This provider's note indicated the following:   "Wellbutrin was prescribed in 05/2019 due to suspicion of underlying depression and lack of concentration. MRI was unremarkable. He reports that he read the side effects associated with Wellbutrin and decided against taking it. He does continue to have concerns of memory loss and insomnia but feels symptoms are unchanged from last visit. He ordered a medication online (unsure of name) to help with his memory but does not feel that it has helped much at all. He goes to bed around 11p. He usually wakes up around 2am and around 5am. He usually gets out of bed around 9am. He does watch TV until he falls asleep. He is using melatonin at home and feels it helps. He does not snore. He denies excessive daytime sleepiness or dry mouth."  HISTORY: (copied from Dr Garth Bigness note on 06/01/2019): "...noticed difficulties with his memory over the past couple months. The onset was gradual over this time. Of note, he notes memory problems more than those around him. He recalls a few examples. He did not remember some appointments.  He is driving well and has not been lost.If he gets a hint, he will remember. He feels he is focusing worse than he used to.  He deniesmuch depression but does note mild apathy compared to earlier this year. He tries to get outdoors some but is not going to the gym as he used to. He served in [place] and denies any PTSD symptoms. He is sleeping more poorly. He typically goes to bed at 11 pm but wakes up around 2 am and then has trouble getting back to sleep. Melatonin has helped some. He denies being told that he snores. He has had thyroid tests and B12 tests.B12 was 317 and TSH 1.07. He notes some tingling in the left arm but no numbness in feet. Gait is unchanged. He has a h/o of prostate cancer, treated with radiation. He has a 9th grade education."  The following portions of the patient's history were reviewed and updated as appropriate: allergies, current medications, past family history, past medical history, past social history, past surgical history and problem list   MoCA was 21/30 on 06/01/2019 . Review of Systems  Neurological: Positive for dizziness (Mild ), light-headedness (initially denies but then admits to experiencing this after "getting up too fast" on infrequent basis) and numbness (mild in right thumb). Negative for tremors, seizures, syncope, facial asymmetry, speech difficulty, weakness and headaches.  Psychiatric/Behavioral: Positive for behavioral problems (mild apathy), confusion (More confused about navigating to less familiar places) and sleep disturbance (mild. Wakes up around 2am but able to get back to sleep until 5. ). Negative for agitation, decreased concentration, dysphoric mood, hallucinations, self-injury and suicidal ideas. The  patient is not nervous/anxious and is not hyperactive.    Onset/Course  Upon direct questioning, the patient reported:   Forgetting recent conversations/events: Mild difficulties  Repeating statements/questions: On occasion   Misplacing/losing items: On occasion  Forgetting appointments or other obligations: Occasionally without reminders. Improving with use of calendar  Forgetting to take medications: Denies   Difficulty concentrating: Denies  Starting but not finishing tasks: Denies  Distracted easily: Denies Processing information more slowly: Denies   Word-finding difficulty: Denies  Word substitutions: Denies Writing difficulty: Denies  Spelling difficulty: Denies  Comprehension difficulty: Denies   Getting lost when driving: Denies  Making wrong turns when driving: Denies  Uncertain about directions when driving or passenger: Sometimes gets in car and forgets where he was going to drive. Reports frequency around twice per year.   Family neuro hx: None reported  Any family hx dementia? Denies   Current Functioning: Work: Retired   Residence: Lives with immediate family (mother, brother, and sister)  Complex ADLs Driving: Able to perform without difficulty. Medication management: Performs independently without trouble. Keeps daily log and signs off after taking each one.  Management of finances: Performs independently Appointments: Uses calendar on phone without difficulty and denies missing any appointments  Cooking: Relies on family to cook most meals but able to use basic appliances and follow recipe.   Medical/Physical complaints:  Any hx of stroke/TIA, MI, LOC/TBI, Sz? 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  Hx falls? Denies  Balance, probs walking? Denies  Sleep: Insomnia? OSA? CPAP? REM sleep beh sx? Typically Sleeps from 11:00PM-5:00AM and   Feels rested most mornings; wakes up once to use restroom and has no trouble returning back to sleep.  Visual illusions/hallucinations? Denies  Appetite/Nutrition/Weight changes? Denies   Current mood: "Alright"   Behavioral disturbance/Personality change: Denies   Suicidal Ideation/Intention: Denies    Psychiatric History: History of depression, anxiety, other MH disorder: Denies  History of MH treatment: Denies  History of SI: Denies  History of substance dependence/treatment: Denies   Social History: Born/Raised:  Education: 9. 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 Arthritis    cervical spondylotic, radiculopathy  . Bradycardia    a. HR previously reported as 40 at PCP's office; event monitor 2017 showed nocturnal bradycardia but no daytime findings.  . CKD (chronic kidney disease), stage II   . Hypercholesteremia   . Hypertension   . Premature atrial contractions   . Prostate cancer ([place])   . PVC's (premature ventricular contractions)    Imaging:  MRI of brain on 06/28/19 was unremarkable and showed average brain volume for age.   CT of head on 07/03/20 showed the following impressions:  1. No acute or reversible finding. Mild age related volume loss. Atherosclerotic calcification of the major vessels at the base of the brain. 2. Bilateral medial orbital blowout fractures in the distant past. . Current Medications:  Outpatient Encounter Medications as of 11/01/2020  Medication Sig  . acetaminophen (TYLENOL) 650 MG CR tablet Take 650-1,300 mg by mouth every 8 (eight) hours as needed for pain.  [name] amLODipine (NORVASC) 5 MG tablet Take 5 mg by mouth every evening.  [name] aspirin EC 81 MG tablet Take 1 tablet (81 mg total) by mouth daily. Swallow whole.  [name] atorvastatin (  LIPITOR) 10 MG tablet Take 10 mg by mouth daily.  [name] losartan (COZAAR) 100 MG tablet Take 100 mg by mouth daily.  . magnesium oxide (MAG-OX) 400 MG tablet Take 400 mg by mouth daily.  . Melatonin 10 MG CAPS Take 10-20 mg by mouth at bedtime.  . metoprolol succinate (TOPROL XL) 25 MG 24 hr tablet Take 1 tablet  (25 mg total) by mouth daily as needed.  . Multiple Vitamins-Minerals (MULTI FOR HIM 50+) TABS Take 1 tablet by mouth daily.  . Nutritional Supplements (IMMUNE ENHANCE) TABS Take 1 tablet by mouth daily.   No facility-administered encounter medications on file as of 11/01/2020.   Behavioral Observations:   Appearance: Casually and appropriately dressed and groomed. Gait: Ambulated independently, no gross abnormalities observed. Slow movements  Speech: Fluent; normal rate, rhythm and volume. Mild word finding difficulty. Thought process: Linear, goal directed Affect: Full, congruent with mood.  Interpersonal: Pleasant, appropriate Mental Status: He is alert and oriented to person, place, and time.   60 minutes spent face-to-face with patient completing neurobehavioral status exam. 60 minutes spent integrating medical records/clinical data and completing this report. [id number] unit; [id number].   TESTING: There is medical necessity to proceed with neuropsychological assessment as the results will be used to aid in differential diagnosis and clinical decision-making and to inform specific treatment recommendations. Per the patient,  and medical records reviewed, there has been a change in cognitive functioning and a reasonable suspicion of a neurocognitive disorder.  Clinical Decision Making: In considering the patient's current level of functioning, level of presumed impairment, nature of symptoms, emotional and behavioral responses during the interview, level of literacy, and observed level of motivation, a battery of tests was selected and communicated to the psychometrician.   PLAN: The patient will return to complete the above referenced full battery of neuropsychological testing with a psychometrician under my supervision. Education regarding testing procedures was provided to the patient. Subsequently, the patient will see this provider for a follow-up session at which time his test  performances and my impressions and treatment recommendations will be reviewed in detail.   Evaluation ongoing; full report to follow.

## 2020-11-02 DIAGNOSIS — R0789 Other chest pain: Secondary | ICD-10-CM | POA: Diagnosis not present

## 2020-11-16 ENCOUNTER — Other Ambulatory Visit: Payer: Self-pay

## 2020-11-16 DIAGNOSIS — N183 Chronic kidney disease, stage 3 unspecified: Secondary | ICD-10-CM | POA: Diagnosis not present

## 2020-11-16 DIAGNOSIS — Z8546 Personal history of malignant neoplasm of prostate: Secondary | ICD-10-CM | POA: Diagnosis not present

## 2020-11-16 DIAGNOSIS — N182 Chronic kidney disease, stage 2 (mild): Secondary | ICD-10-CM | POA: Diagnosis not present

## 2020-11-16 DIAGNOSIS — E7800 Pure hypercholesterolemia, unspecified: Secondary | ICD-10-CM | POA: Diagnosis not present

## 2020-11-16 DIAGNOSIS — M169 Osteoarthritis of hip, unspecified: Secondary | ICD-10-CM | POA: Diagnosis not present

## 2020-11-16 DIAGNOSIS — I10 Essential (primary) hypertension: Secondary | ICD-10-CM | POA: Diagnosis not present

## 2020-11-16 DIAGNOSIS — R413 Other amnesia: Secondary | ICD-10-CM | POA: Diagnosis not present

## 2020-11-16 DIAGNOSIS — C61 Malignant neoplasm of prostate: Secondary | ICD-10-CM | POA: Diagnosis not present

## 2020-11-22 ENCOUNTER — Other Ambulatory Visit: Payer: Self-pay

## 2020-11-22 ENCOUNTER — Encounter (HOSPITAL_BASED_OUTPATIENT_CLINIC_OR_DEPARTMENT_OTHER): Payer: Medicare HMO | Admitting: Psychology

## 2020-11-22 DIAGNOSIS — F03A0 Unspecified dementia, mild, without behavioral disturbance, psychotic disturbance, mood disturbance, and anxiety: Secondary | ICD-10-CM

## 2020-11-22 DIAGNOSIS — F0280 Dementia in other diseases classified elsewhere without behavioral disturbance: Secondary | ICD-10-CM

## 2020-11-22 DIAGNOSIS — R413 Other amnesia: Secondary | ICD-10-CM | POA: Diagnosis not present

## 2020-11-23 ENCOUNTER — Other Ambulatory Visit: Payer: Self-pay

## 2020-11-23 ENCOUNTER — Emergency Department (HOSPITAL_COMMUNITY)
Admission: EM | Admit: 2020-11-23 | Discharge: 2020-11-23 | Disposition: A | Discharge status: Home / Self Care | Payer: Medicare HMO | Attending: Emergency Medicine | Admitting: Emergency Medicine

## 2020-11-23 DIAGNOSIS — Z87891 Personal history of nicotine dependence: Secondary | ICD-10-CM | POA: Diagnosis not present

## 2020-11-23 DIAGNOSIS — Z79899 Other long term (current) drug therapy: Secondary | ICD-10-CM | POA: Diagnosis not present

## 2020-11-23 DIAGNOSIS — Z8546 Personal history of malignant neoplasm of prostate: Secondary | ICD-10-CM | POA: Insufficient documentation

## 2020-11-23 DIAGNOSIS — Z7982 Long term (current) use of aspirin: Secondary | ICD-10-CM | POA: Diagnosis not present

## 2020-11-23 DIAGNOSIS — R202 Paresthesia of skin: Secondary | ICD-10-CM | POA: Insufficient documentation

## 2020-11-23 DIAGNOSIS — I129 Hypertensive chronic kidney disease with stage 1 through stage 4 chronic kidney disease, or unspecified chronic kidney disease: Secondary | ICD-10-CM | POA: Insufficient documentation

## 2020-11-23 DIAGNOSIS — N182 Chronic kidney disease, stage 2 (mild): Secondary | ICD-10-CM | POA: Insufficient documentation

## 2020-11-23 DIAGNOSIS — R001 Bradycardia, unspecified: Secondary | ICD-10-CM | POA: Diagnosis not present

## 2020-11-23 DIAGNOSIS — Z96642 Presence of left artificial hip joint: Secondary | ICD-10-CM | POA: Diagnosis not present

## 2020-11-23 LAB — I-STAT CHEM 8, ED
BUN: 14 mg/dL (ref 8–23)
Calcium, Ion: 1.26 mmol/L (ref 1.15–1.40)
Chloride: 106 mmol/L (ref 98–111)
Creatinine, Ser: 1.2 mg/dL (ref 0.61–1.24)
Glucose, Bld: 88 mg/dL (ref 70–99)
HCT: 35 % — ABNORMAL LOW (ref 39.0–52.0)
Hemoglobin: 11.9 g/dL — ABNORMAL LOW (ref 13.0–17.0)
Potassium: 3.6 mmol/L (ref 3.5–5.1)
Sodium: 141 mmol/L (ref 135–145)
TCO2: 25 mmol/L (ref 22–32)

## 2020-11-23 MED ORDER — PREDNISONE 20 MG PO TABS: 40.0000 mg | ORAL_TABLET | Freq: Every day | ORAL | 0 refills | Status: DC

## 2020-11-23 MED ORDER — AMLODIPINE BESYLATE 5 MG PO TABS
5.0000 mg | ORAL_TABLET | Freq: Once | ORAL | Status: AC
  Administered 2020-11-23: 5 mg via ORAL
  Filled 2020-11-23: qty 1

## 2020-11-23 NOTE — ED Notes (Signed)
Doctor [facility] consulted about bradycardia.  She states that he is not symptomatic and has hx of same so ok to dC.

## 2020-11-23 NOTE — ED Provider Notes (Signed)
Emergency Medicine Provider Triage Evaluation Note  Dennis Huffman , a 70 y.o. male  was evaluated in triage.  Pt complains of hand tingling.  Review of Systems  Positive: L hand tingling, concern about BP Negative: Cp, weakness, injury  Physical Exam  BP (!) 144/77 (BP Location: Left Arm)   Pulse 61   Temp 98.9 F (37.2 C) (Oral)   Resp 14   SpO2 100%  Gen:   Awake, no distress   Resp:  Normal effort  MSK:   Moves extremities without difficulty  Other:  Normal grip strength bilaterally  Medical Decision Making  Medically screening exam initiated at 6:46 PM.  Appropriate orders placed.  ELDRED SOOY was informed that the remainder of the evaluation will be completed by another provider, this initial triage assessment does not replace that evaluation, and the importance of remaining in the ED until their evaluation is complete.  L hand tingling since yesterday.  Worry about high BP.  No cp, sob, no change in activity.  Is R hand dominant.  No hx of diabetes.    Domenic Moras, PA-C 11/23/20 Pollie Meyer    Blanchie Dessert, MD 11/23/20 2011

## 2020-11-23 NOTE — ED Triage Notes (Signed)
Pt reports L hand pain and tingling since yesterday. Denies injury.

## 2020-11-23 NOTE — ED Notes (Signed)
DC instructions reviewed with pt. PT verbalized understanding. Pt DC °

## 2020-11-23 NOTE — ED Provider Notes (Signed)
[facility] EMERGENCY DEPARTMENT Provider Note   CSN: [id number] Arrival date & time: 11/23/20  1638     History Chief Complaint  Patient presents with  . Hand Pain    Dennis Huffman is a 70 y.o. male.  Patient is a 70 year old male with a history of hypertension, hypercholesterolemia, CKD stage II, bradycardia and arthritis who is presenting today with left hand tingling.  He reports that it started yesterday and has been persistent.  It is over the entire palm of his hand but does not really affect the dorsum of his hand or burning up his arm.  Nothing seems to make it better or worse.  Is not affecting any of his daily activity.  Patient is right-handed.  It is not getting worse it is just not gone away.  He has had symptoms like this before but it was worse and in the entire arm and resolved after he got a cortisone injection in his right arm.  He denies any neck pain no recent trauma.  No repetitive activities.  He has not noticed any swelling or color change in his hand.  He was concerned that maybe his blood pressure was elevated and that was causing his symptoms.  He is taking his daily blood pressure medication losartan 100 mg but had not taken the amlodipine 5 mg yet.  He does not check his blood pressure at home.  He has not had any chest pain, shortness of breath or exertional symptoms.  He has been eating and drinking normally and denies any abdominal pain or lower extremity swelling.  The history is provided by the patient.  Hand Pain This is a new problem.       Past Medical History:  Diagnosis Date  . Arthritis    cervical spondylotic, radiculopathy  . Bradycardia    a. HR previously reported as 40 at PCP's office; event monitor 2017 showed nocturnal bradycardia but no daytime findings.  . CKD (chronic kidney disease), stage II   . Hypercholesteremia   . Hypertension   . Premature atrial contractions   . Prostate cancer ([place])   . PVC's (premature  ventricular contractions)     Patient Active Problem List   Diagnosis Date Noted  . Degenerative joint disease of left hip 11/28/2018  . Prostate cancer ([place]) 08/29/2016  . Neck pain 05/12/2015  . Bradycardia 04/21/2014  . Hyperlipidemia 04/21/2014    Past Surgical History:  Procedure Laterality Date  . ANTERIOR CERVICAL DECOMP/DISCECTOMY FUSION N/A 05/12/2015   Procedure: ANTERIOR CERVICAL DECOMPRESSION/DISCECTOMY FUSION C3 - C6 3 LEVELS;  Surgeon: Melina Schools, MD;  Location: [facility];  Service: Orthopedics;  Laterality: N/A;  . colonscopy    . COLONSCOPY     . INSERTION PROSTATE RADIATION SEED  UNSURE OF DATE   DONE BY DR Karsten Ro   . PROSTATE BIOPSY    . ROTATOR CUFF REPAIR Right 03-13-12; 09-27-15  . TOTAL HIP ARTHROPLASTY Left 11/28/2018   Procedure: TOTAL HIP ARTHROPLASTY;  Surgeon: Earlie Server, MD;  Location: [facility];  Service: Orthopedics;  Laterality: Left;       Family History  Problem Relation Age of Onset  . Healthy Mother   . Healthy Father   . Stroke Sister   . Cancer Sister        breast    Social History   Tobacco Use  . Smoking status: Former Smoker    Quit date: 06/26/1971    Years since quitting: 49.4  .  Smokeless tobacco: Never Used  Vaping Use  . Vaping Use: Never used  Substance Use Topics  . Alcohol use: Yes    Comment: socially   . Drug use: No    Home Medications Prior to Admission medications   Medication Sig Start Date End Date Taking? Authorizing Provider  acetaminophen (TYLENOL) 650 MG CR tablet Take 650-1,300 mg by mouth every 8 (eight) hours as needed for pain.    [provider]  amLODipine (NORVASC) 5 MG tablet Take 5 mg by mouth every evening.    [provider]  aspirin EC 81 MG tablet Take 1 tablet (81 mg total) by mouth daily. Swallow whole. 09/26/20   Jettie Booze, MD  atorvastatin (LIPITOR) 10 MG tablet Take 10 mg by mouth daily.    [provider]  losartan (COZAAR) 100 MG tablet Take 100 mg  by mouth daily.    [provider]  magnesium oxide (MAG-OX) 400 MG tablet Take 400 mg by mouth daily.    [provider]  Melatonin 10 MG CAPS Take 10-20 mg by mouth at bedtime.    [provider]  metoprolol succinate (TOPROL XL) 25 MG 24 hr tablet Take 1 tablet (25 mg total) by mouth daily as needed. 08/18/20   Jettie Booze, MD  Multiple Vitamins-Minerals [facility] FOR HIM 50+) TABS Take 1 tablet by mouth daily.    [provider]  Nutritional Supplements (IMMUNE ENHANCE) TABS Take 1 tablet by mouth daily.    [provider]    Allergies    Hydrocodone-acetaminophen, Ramipril, and Hydrocodone  Review of Systems   Review of Systems  All other systems reviewed and are negative.   Physical Exam Updated Vital Signs BP (!) 157/59   Pulse (!) 50   Temp 98 F (36.7 C) (Oral)   Resp 18   SpO2 100%   Physical Exam Vitals and nursing note reviewed.  Constitutional:      General: He is not in acute distress.    Appearance: Normal appearance. He is well-developed.  HENT:     Head: Normocephalic and atraumatic.  Eyes:     Conjunctiva/sclera: Conjunctivae normal.     Pupils: Pupils are equal, round, and reactive to light.  Cardiovascular:     Rate and Rhythm: Regular rhythm. Bradycardia present.     Pulses: Normal pulses.     Heart sounds: No murmur heard.   Pulmonary:     Effort: Pulmonary effort is normal. No respiratory distress.     Breath sounds: Normal breath sounds. No wheezing or rales.  Abdominal:     General: There is no distension.     Palpations: Abdomen is soft.     Tenderness: There is no abdominal tenderness. There is no guarding or rebound.  Musculoskeletal:        General: No tenderness. Normal range of motion.     Cervical back: Normal range of motion and neck supple. No tenderness.     Comments: 2+ radial pulse bilaterally.  No tenderness with palpation of the shoulder and full range of motion.  No cervical  spine tenderness  Skin:    General: Skin is warm and dry.     Findings: No erythema or rash.  Neurological:     Mental Status: He is alert and oriented to person, place, and time. Mental status is at baseline.     Comments: 5 out of 5 grip in bilateral upper extremities.  Sensation is intact.  Psychiatric:  Mood and Affect: Mood normal.        Behavior: Behavior normal.      ED Results / Procedures / Treatments   Labs (all labs ordered are listed, but only abnormal results are displayed) Labs Reviewed  I-STAT CHEM 8, ED - Abnormal; Notable for the following components:      Result Value   Hemoglobin 11.9 (*)    HCT 35.0 (*)    All other components within normal limits    EKG EKG Interpretation  Date/Time:  Wednesday November 23 2020 18:50:41 EDT Ventricular Rate:  56 PR Interval:  182 QRS Duration: 88 QT Interval:  458 QTC Calculation: 441 R Axis:   20 Text Interpretation: Sinus bradycardia Otherwise normal ECG No significant change since last tracing Confirmed by Blanchie Dessert [phone]) on 11/23/2020 10:04:01 PM   Radiology No results found.  Procedures Procedures   Medications Ordered in ED Medications  amLODipine (NORVASC) tablet 5 mg (5 mg Oral Given 11/23/20 2219)    ED Course  I have reviewed the triage vital signs and the nursing notes.  Pertinent labs & imaging results that were available during my care of the patient were reviewed by me and considered in my medical decision making (see chart for details).    MDM Rules/Calculators/A&P                          Elderly male presenting today with numbness in the palm of his left hand.  He denies any traumatic events or repetitive activities.  He has no similar sensation in the upper arm or shoulder.  He has no neck pain.  He has normal pulse and good grip strength bilaterally.  He has no associated chest pain or shortness of breath with the symptoms.  This has been present since yesterday and has been  persistent.  He has had similar discomfort in the past but reports it was much worse and resolved after a steroid injection.  Today's EKG without acute findings.  Low suspicion for stroke.  Patient thought his blood pressure may be elevated but does not check it at home.  He is on losartan which he did take today but has not taken his 5 mg of amlodipine.  Patient is well-appearing.  Suspect symptoms are musculoskeletal in nature with possible nerve compression.  Low suspicion for any cardiac cause for his symptoms.  Patient given his dose of amlodipine.  Blood pressure 157/59.  Will discharge home with steroids and follow-up with his orthopedist.  I-STAT Chem-8 today is at baseline with creatinine of 1.2 and normal electrolytes.  MDM Number of Diagnoses or Management Options   Amount and/or Complexity of Data Reviewed Clinical lab tests: reviewed Tests in the medicine section of CPT: reviewed Independent visualization of images, tracings, or specimens: yes   Final Clinical Impression(s) / ED Diagnoses Final diagnoses:  Left hand paresthesia    Rx / DC Orders ED Discharge Orders    None       Blanchie Dessert, MD 11/23/20 2309

## 2020-11-23 NOTE — Discharge Instructions (Addendum)
You take 2 blood pressure pills (Losartan 100mg  and Amlodipine 5mg ).  You take 1 cholesterol medicine (atorvastatin).  You received Amlodipine in the ER so do not take it tonight when you get home.  However if you still have your losartan in your box you can take it before you go to bed.  If not take it in the morning like usual.  The steroid medication for your hand was sent to the pharmacy.  If it does not get better with the medicine you need to follow up with your orthopeadist Dr. French Ana.  If you start having arm or leg weakness or trouble walking or holding things in that arm you need to return to the ER.

## 2020-11-24 DIAGNOSIS — M79642 Pain in left hand: Secondary | ICD-10-CM | POA: Diagnosis not present

## 2020-11-25 DIAGNOSIS — R001 Bradycardia, unspecified: Secondary | ICD-10-CM | POA: Diagnosis not present

## 2020-11-25 DIAGNOSIS — I10 Essential (primary) hypertension: Secondary | ICD-10-CM | POA: Diagnosis not present

## 2020-11-25 NOTE — Progress Notes (Signed)
   Neuropsychology Note  MAXAMILIAN AMADON completed 60 minutes of neuropsychological testing with technician, Dina Rich, BS, under the supervision of Alfonso Ellis, PsyD., Clinical Neuropsychologist. The patient did not appear overtly distressed by the testing session, per behavioral observation or via self-report to the technician. Rest breaks were offered.   Clinical Decision Making: In considering the patient's current level of functioning, level of presumed impairment, nature of symptoms, emotional and behavioral responses during the interview, level of literacy, and observed level of motivation/effort, a battery of tests was selected and communicated to the technician.  Communication between the neuropsychologist and technician was ongoing throughout the testing session and changes were made as deemed necessary based on patient performance on testing, technician observations and additional pertinent factors such as those listed above.  Tests Administered: . Repeatable Battery for the Assessment of Neuropsychological Status (RBANS); Form A . [place] Functional Living Scale (TFLS)   Results: Will be included in final report   Feedback to Patient: ROREY BISSON will return on 11/29/20 for an interactive feedback session with Dr. Darol Destine at which time his test performances, clinical impressions and treatment recommendations will be reviewed in detail. The patient understands he can contact our office should he require our assistance before this time.  60 minutes spent face-to-face with patient administering standardized tests, 35 minutes spent scoring Environmental education officer). [CPT [id number], [id number]]  Full report to follow.

## 2020-11-28 ENCOUNTER — Encounter: Payer: Self-pay | Admitting: Psychology

## 2020-11-28 NOTE — Progress Notes (Signed)
NEUROPSYCHOLOGICAL EVALUATION   Name:    Dennis Huffman  Date of Birth:   1950/10/11 Date of Interview:  11/01/20 Date of Testing:  11/16/20 Date of Report:  11/22/20      Background Information:  Reason for Referral:  Dennis Huffman is a 70 y.o. male referred by Debbora Presto, NP of [facility] to assess his current level of cognitive functioning and assist in differential diagnosis. The current evaluation consisted of a review of available medical records, an interview with the patient (11/01/20), and the completion of a neuropsychological testing battery with technician on 11/17/20. Informed consent was obtained.  History of Presenting Problem: Dennis Huffman a 70 y.o.malewith a medical history remarkable for Arthritis, Bradycardia, CKD (chronic kidney disease), stage II, Hypercholesteremia, Hypertension, Premature atrial contractions, Prostate cancer ([place]), and PVC's (premature ventricular contractions) here today for follow up of subjective memory complaints.  According to medical records, the patient was last seen by Debbora Presto, NP (Neurology) on 09/15/20. This provider's note indicated the following: "Wellbutrin was prescribed in 05/2019 due to suspicion of underlying depression and lack of concentration. MRI was unremarkable. He reports that he read the side effects associated with Wellbutrin and decided against taking it. He does continue to have concerns of memory loss and insomnia but feels symptoms are unchanged from last visit. He ordered a medication online (unsure of name) to help with his memory but does not feel that it has helped much at all. He goes to bed around 11p. He usually wakes up around 2am and around 5am. He usually gets out of bed around 9am. He does watch TV until he falls asleep. He is using melatonin at home and feels it helps. He does not snore. He denies excessive daytime sleepiness or dry mouth.  (copied from Dr Garth Bigness note on 06/01/2019):  "... noticed difficulties with his memory over the past couple months. The onset was gradual over this time. Of note, he notes memory problems more than those around him. He recalls a few examples. He did not remember some appointments. He is driving well and has not been lost.If he gets a hint, he will remember. He feels he is focusing worse than he used to.  He deniesmuch depression but does note mild apathy compared to earlier this year. He tries to get outdoors some but is not going to the gym as he used to. He served in [place] and denies any PTSD symptoms. He is sleeping more poorly. He typically goes to bed at 11 pm but wakes up around 2 am and then has trouble getting back to sleep. Melatonin has helped some. He denies being told that he snores. He has had thyroid tests and B12 tests.B12 was 317 and TSH 1.07. He notes some tingling in the left arm but no numbness in feet. Gait is unchanged. He has a h/o of prostate cancer, treated with radiation. He has a 9th grade education."  The following portions of the patient's history were reviewed and updated as appropriate: allergies, current medications, past family history, past medical history, past social history, past surgical history and problem list   MoCA was 21/30 on 06/01/2019 . Review of Systems  Neurological: Positive for dizziness (Mild ), light-headedness (initially denies but then admits to experiencing this after "getting up too fast" on infrequent basis) and numbness (mild in right thumb). Negative for tremors, seizures, syncope, facial asymmetry, speech difficulty, weakness and headaches.  Psychiatric/Behavioral: Positive for behavioral problems (mild apathy),  confusion (More confused about navigating to less familiar places) and sleep disturbance (Mild. Wakes up around 2am but able to get back to sleep until 5). Negative for agitation, decreased concentration, dysphoric mood, hallucinations, self-injury and suicidal ideas. The patient  is not nervous/anxious and is not hyperactive.    Upon direct questioning, the patient reported the following:   Forgetting recent conversations/events: Mild difficulties  Repeating statements/questions: On occasion  Misplacing/losing items: On occasion  Forgetting appointments or other obligations: Occasionally without reminders. Improving with use of calendar  Forgetting to take medications: Denies    Difficulty concentrating: Denies  Starting but not finishing tasks: Denies  Distracted easily: Denies Processing information more slowly: Denies   Word-finding difficulty: Denies  Word substitutions: Denies Writing difficulty: Denies  Spelling difficulty: Denies  Comprehension difficulty: Denies   Getting lost when driving: Denies  Making wrong turns when driving: Denies  Uncertain about directions when driving or passenger: Sometimes gets in car and forgets where he was going to drive. Reports frequency around twice per year.   Family neuro hx: None reported  Any family hx dementia? Denies   Current Functioning: Work: Retired   Residence: Lives with immediate family (mother, brother, and sister)  Complex ADLs Driving: Able to perform without difficulty. Medication management: Performs independently without trouble. Keeps daily log and signs off after taking each one.  Management of finances: Performs independently Appointments: Uses calendar on phone without difficulty and denies missing any appointments  Cooking: Relies on family to cook most meals but able to use basic appliances and follow recipe.   Medical/Physical complaints:  Any hx of stroke/TIA, MI, LOC/TBI, Sz? 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  Hx falls? Denies  Balance, probs walking? Denies  Sleep: Insomnia? OSA? CPAP? REM sleep beh sx? Typically Sleeps from 11:00PM-5:00AM and feels rested most mornings; wakes up once to use restroom and has no trouble  returning back to sleep.  Visual illusions/hallucinations? Denies  Appetite/Nutrition/Weight changes? Denies   Current mood: "Alright"   Behavioral disturbance/Personality change: Denies   Suicidal Ideation/Intention: Denies   Psychiatric History: History of depression, anxiety, other MH disorder: Denies  History of MH treatment: Denies  History of SI: Denies  History of substance dependence/treatment: Denies   Social History: Education: Dropped out in 9th grade. 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 Arthritis    cervical spondylotic, radiculopathy  . Bradycardia    a. HR previously reported as 40 at PCP's office; event monitor 2017 showed nocturnal bradycardia but no daytime findings.  . CKD (chronic kidney disease), stage II   . Hypercholesteremia   . Hypertension   . Premature atrial contractions   . Prostate cancer ([place])   . PVC's (premature ventricular contractions)    Imaging:  MRI of brain on 06/28/19 was unremarkable and showed average brain volume for age.   CT of head on 07/03/20 showed the following impressions:  1. No acute or reversible finding. Mild age related volume loss. Atherosclerotic calcification of the major vessels at the base of the brain. 2. Bilateral medial orbital blowout fractures in the distant past.  Current medications:  Outpatient Encounter Medications as of 11/22/2020  Medication Sig  . acetaminophen (TYLENOL) 650 MG CR tablet Take 650-1,300 mg by mouth every 8 (  eight) hours as needed for pain.  [name] amLODipine (NORVASC) 5 MG tablet Take 5 mg by mouth every evening.  [name] aspirin EC 81 MG tablet Take 1 tablet (81 mg total) by mouth daily. Swallow whole.  [name] atorvastatin (LIPITOR) 10 MG tablet Take 10 mg by mouth daily.  [name] losartan  (COZAAR) 100 MG tablet Take 100 mg by mouth daily.  . magnesium oxide (MAG-OX) 400 MG tablet Take 400 mg by mouth daily.  . Melatonin 10 MG CAPS Take 10-20 mg by mouth at bedtime.  . metoprolol succinate (TOPROL XL) 25 MG 24 hr tablet Take 1 tablet (25 mg total) by mouth daily as needed.  . Multiple Vitamins-Minerals (MULTI FOR HIM 50+) TABS Take 1 tablet by mouth daily.  . Nutritional Supplements (IMMUNE ENHANCE) TABS Take 1 tablet by mouth daily.   No facility-administered encounter medications on file as of 11/22/2020.   Current Examination:  Behavioral Observations:   Appearance: Casually and appropriately dressed and groomed. Gait: Ambulated independently, no gross abnormalities observed. Slow movements  Speech: Fluent; normal rate, rhythm and volume. Mild word finding difficulty. Thought process: Linear, goal directed Affect: Full, congruent with mood.  Interpersonal: Pleasant, appropriate Mental Status: He is alert and oriented to person, place, and time.   Orientation: Oriented to all spheres. Accurately named the current President and his predecessor.   Dennis Huffman completed 60 minutes of neuropsychological testing with technician, Dina Rich, BS, under the supervision of this provider. The patient did not appear overtly distressed by the testing session, per behavioral observation or via self-report to the technician. Rest breaks were offered.   Clinical Decision Making: In considering the patient's current level of functioning, level of presumed impairment, nature of symptoms, emotional and behavioral responses during the interview, level of literacy, and observed level of motivation/effort, a battery of tests was selected and communicated to the technician.  Communication between the neuropsychologist and technician was ongoing throughout the testing session and changes were made as deemed necessary based on patient performance on testing, technician observations and  additional pertinent factors such as those listed above.  Tests Administered:  Repeatable Battery for the Assessment of Neuropsychological Status (RBANS); Form A  [place] Functional Living Scale (TFLS)   Test Results: Note: Standardized scores are presented only for use by appropriately trained professionals and to allow for any future test-retest comparison. These scores should not be interpreted without consideration of all the information that is contained in the rest of the report. The most recent standardization samples from the test publisher or other sources were used whenever possible to derive standard scores; scores were corrected for age, gender, ethnicity and education when available.   Test Scores:  TEST SCORES:  Note: This summary of test scores accompanies the interpretive report and should not be considered in isolation without reference to the appropriate sections in the text. Descriptors are based on appropriate normative data and may be adjusted based on clinical judgment. The terms "impaired" and "within normal limits (WNL)" are used when a more specific level of functioning cannot be determined.    Validity Testing:     Descriptor         RBANS Effort Index: --- --- Within Expectation         Cognitive Screening:               RBANS, Form A: Standard Score/ Scaled Score Percentile    Total Score 72 3 Well Below Average  Immediate Memory 65 1 Exceptionally  Low  List Learning 4 2 Well Below Average  Story Memory 4 2 Well Below Average  Visuospatial/Constructional 100 50 Average  Figure Copy 14 91 Above Average  Line Orientation 12/20 3-9 Well Below Average  Language 82 12 Below Average  Picture Naming 10/10 51-75 Average  Semantic Fluency 3 1 Exceptionally Low  Attention 91 27 Average  Digit Span 10 50 Average  Coding 7 16 Below Average  Delayed Memory 52 <1 Exceptionally Low  List Recall 0/10 <2 Exceptionally Low  List Recognition 14/20 <2 Exceptionally Low   Story Recall 1 <1 Exceptionally Low  Figure Recall 6 9 Below Average         Adaptive Functioning            Raw Score Cumulative Percentile   [place] Functional Living Scale (TFLS)     Time 7 17-25 Average  Memory & Calculation 7 26-50 Average  Communication  16 ?2 Exceptionally Low   Memory 5 26-50 Average  Total 32 (T=32) 4 Well Below Average        Description of Test Results:  VALIDITY: The validity of neuropsychological testing is limited by the extent to which the individual being tested may be assumed to have exerted adequate effort during testing. Behaviorally, Mr. Ashraf appeared motivated and he persisted well. He did not bring glasses and did  have problems seeing some test items, especially small print.  An embedded validity indicator (RBANS Effort Index) was within normal limits. Overall, the current results are likely a valid estimate of his capabilities  PREMORBID INTELLECTUAL ABILITY: Estimated to have been within the below average to average range based on education and occupational history.   OVERALL COGNITIVE STATUS: Dennis Huffman was given a neuropsychological screening battery (RBANS), which contains 12 subtests covering five neuropsychological domains. His total scale score on that battery, a composite of performance across tests and estimate of global cognitive functioning, was 72, which was well below average for his age (3rd percentile). Performance on individual tests and domain scores are provided below.   ATTENTION/PROCESSING SPEED: Dennis Huffman RBANS Attention Index score of 91 was average for his age (27th percentile). His performance on a test of auditory attention (RBANS Digit Span) was average (50th percentile) and he repeated up to 6 digits in order. A test of processing speed with visual scanning (RBANS Coding) was below average (16th percentile).   LANGUAGE: The tone, prosody, and fluency of Dennis Huffman speech were normal, with no clear errors or difficulty  in speech and receptive language was intact. His RBANS Language Index score of 82 was below average for his age (12th percentile). Confrontation naming (RBANS Picture Naming) was error-free, which is at least average (51-75th percentile). Category fluency (RBANS Semantic Fluency) was exceptionally low (1st percentile).   VISUOSPATIAL ABILITIES: Visuospatial ability was a relative strength. Dennis Huffman RBANS Visuospatial Index score of 100 was average for his age (50th percentile). Copy of a geometric figure (RBANS Figure Copy) was above average (91st percentile) and visuoperception (RBANS Line Orientation) was well below average (3-9th percentile); likely impacted by not wearing corrective lenses.   MEMORY: Dennis Huffman RBANS Immediate Memory Index score of 65 was exceptionally low for his age (1st percentile). Immediate recall for a list of 10 words read to him 4 times (RBANS List Learning) was well below average (2nd percentile). Immediate recall for a story read 2 times (RBANS Story Memory) was also well below average (2nd percentile).  Dennis Huffman RBANS Delayed Memory Index score of  80 was exceptionally low for his age (<1st percentile). Spontaneous recall for the list of words read to him after delay (RBANS List Recall) was exceptionally low (0 words, <2nd percentile) and yes/no recognition for the words  (RBANS List Recognition) was also impaired although somewhat improved (14/20, <2nd percentile). Delayed recall for the story [facility] Story Recall) was exceptionally low (<2nd percentile). Spontaneous recall for the copied figure (RBANS Figure Recall) scored  below average (9th percentile).   On a self-report measure of mood, the patient's responses were not indicative of clinically significant depression at the present time. On a self-report measure of anxiety, the patient did not endorse clinically significant generalized anxiety at the present time.   ADAPTIVE FUNCTIONING: Dennis Huffman was  administered the [place] Functional Living Scale (TFLS) to assess various instrumental activities of daily living. His ability to read and use clocks and calenders was average. His ability to perform calculations using time and money was also average. His ability to conduct basic daily activities such as paying bills and using a phone book was exceptionally low and likely impacted by visual difficulties; target information is presented in small print for these tasks. He was able to correctly identify appropriate contact in case of an emergency and steps for making a simple meal (i.e., PB&J). He had trouble following directions on a food label to pretend to set a microwave to cook food correctly. His ability to recall previously presented information was average.   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 However, there are a few areas of relative impairment suggestive of mild fronto-subcortical dysfunction (e.g., variable encoding abilities with fair consolidation; impaired verbal fluency, intact adaptive function). His cognitive profile appears most consistent with a major neurocognitive disorder due to multiple etiologies (most prominently vascular), but not with a progressive neurodegenerative dementia. Potential etiologies may include a combination of the sequelae stemming from the effects of cerebrovascular disease and multiple medical conditions (e.g. chronic kidney disease, hypertension, diabetes, hyperlipidemia, bradycardia, etc.). Recent neuroimaging did  demonstrate atherosclerotic calcification of the major vessels at the base of the brain. Fortunately, there is no evidence of depression or anxiety.   Recommendations: Based on the findings of the present evaluation, the  following recommendations are offered:  1. Optimal control of vascular risk factors is necessary to reduce the risk of further cognitive decline.   2. Neuropsychological re-assessment in one year (or sooner if there is a change in cognition or behavior) is recommended in order to monitor cognitive status, track any progression of symptoms and further assist with treatment planning.  Feedback to Patient: Dennis Huffman will return on 11/29/20 for an interactive feedback session with this provider at which time his test performances, clinical impressions and treatment recommendations will be reviewed in detail. The patient understands he can contact our office should he require our assistance before this time.  Total time spent on this patient's case:  120 minutes for neurobehavioral status exam with psychologist including, integration of patient data, clinical decision making, and preparation of initial progress report (CPT code 96116/96121); 95 minutes of testing/scoring by psychometrician under psychologist's supervision (CPT codes [phone], [phone] units); 60 minutes for integration of patient data, interpretation of standardized test results and clinical data, clinical decision making, treatment planning and preparation of this report (CPT code [phone]).    Thank you for  your referral of Dennis Huffman. Please feel free to contact me if you have any questions or concerns regarding this report.

## 2020-11-29 ENCOUNTER — Encounter: Payer: Medicare HMO | Admitting: Psychology

## 2020-11-30 ENCOUNTER — Encounter: Payer: Medicare HMO | Attending: Psychology | Admitting: Psychology

## 2020-11-30 ENCOUNTER — Other Ambulatory Visit: Payer: Self-pay

## 2020-11-30 ENCOUNTER — Encounter: Payer: Self-pay | Admitting: Psychology

## 2020-11-30 DIAGNOSIS — F0280 Dementia in other diseases classified elsewhere without behavioral disturbance: Secondary | ICD-10-CM

## 2020-11-30 DIAGNOSIS — R413 Other amnesia: Secondary | ICD-10-CM | POA: Diagnosis not present

## 2020-11-30 DIAGNOSIS — F03A0 Unspecified dementia, mild, without behavioral disturbance, psychotic disturbance, mood disturbance, and anxiety: Secondary | ICD-10-CM

## 2020-11-30 NOTE — Progress Notes (Signed)
   Neuropsychology Feedback Appointment  Dennis Huffman returned for a feedback appointment today to review the results of his recent neuropsychological evaluation with this provider. 60 minutes face-to-face time was spent reviewing his test results, my impressions and my recommendations as detailed in his report. Education was provided about mild major neurocognitive disorder due to multiple etiologies. The patient was given the opportunity to ask questions, and I did my best to answer these to his  satisfaction.

## 2020-12-09 DIAGNOSIS — C61 Malignant neoplasm of prostate: Secondary | ICD-10-CM | POA: Diagnosis not present

## 2020-12-09 DIAGNOSIS — I129 Hypertensive chronic kidney disease with stage 1 through stage 4 chronic kidney disease, or unspecified chronic kidney disease: Secondary | ICD-10-CM | POA: Diagnosis not present

## 2020-12-09 DIAGNOSIS — N183 Chronic kidney disease, stage 3 unspecified: Secondary | ICD-10-CM | POA: Diagnosis not present

## 2020-12-09 DIAGNOSIS — I10 Essential (primary) hypertension: Secondary | ICD-10-CM | POA: Diagnosis not present

## 2020-12-09 DIAGNOSIS — N182 Chronic kidney disease, stage 2 (mild): Secondary | ICD-10-CM | POA: Diagnosis not present

## 2020-12-09 DIAGNOSIS — Z8546 Personal history of malignant neoplasm of prostate: Secondary | ICD-10-CM | POA: Diagnosis not present

## 2020-12-09 DIAGNOSIS — N1831 Chronic kidney disease, stage 3a: Secondary | ICD-10-CM | POA: Diagnosis not present

## 2020-12-09 DIAGNOSIS — M169 Osteoarthritis of hip, unspecified: Secondary | ICD-10-CM | POA: Diagnosis not present

## 2020-12-09 DIAGNOSIS — E7800 Pure hypercholesterolemia, unspecified: Secondary | ICD-10-CM | POA: Diagnosis not present

## 2020-12-12 DIAGNOSIS — R202 Paresthesia of skin: Secondary | ICD-10-CM | POA: Diagnosis not present

## 2020-12-12 DIAGNOSIS — I129 Hypertensive chronic kidney disease with stage 1 through stage 4 chronic kidney disease, or unspecified chronic kidney disease: Secondary | ICD-10-CM | POA: Diagnosis not present

## 2020-12-12 DIAGNOSIS — N1831 Chronic kidney disease, stage 3a: Secondary | ICD-10-CM | POA: Diagnosis not present

## 2020-12-16 DIAGNOSIS — R001 Bradycardia, unspecified: Secondary | ICD-10-CM | POA: Diagnosis not present

## 2020-12-22 DIAGNOSIS — M79642 Pain in left hand: Secondary | ICD-10-CM | POA: Diagnosis not present

## 2020-12-28 DIAGNOSIS — I10 Essential (primary) hypertension: Secondary | ICD-10-CM | POA: Diagnosis not present

## 2020-12-28 DIAGNOSIS — M519 Unspecified thoracic, thoracolumbar and lumbosacral intervertebral disc disorder: Secondary | ICD-10-CM | POA: Diagnosis not present

## 2020-12-28 DIAGNOSIS — Z0000 Encounter for general adult medical examination without abnormal findings: Secondary | ICD-10-CM | POA: Diagnosis not present

## 2020-12-28 DIAGNOSIS — M5090 Cervical disc disorder, unspecified, unspecified cervical region: Secondary | ICD-10-CM | POA: Diagnosis not present

## 2020-12-28 DIAGNOSIS — R7303 Prediabetes: Secondary | ICD-10-CM | POA: Diagnosis not present

## 2020-12-28 DIAGNOSIS — N182 Chronic kidney disease, stage 2 (mild): Secondary | ICD-10-CM | POA: Diagnosis not present

## 2020-12-28 DIAGNOSIS — Z8546 Personal history of malignant neoplasm of prostate: Secondary | ICD-10-CM | POA: Diagnosis not present

## 2020-12-28 DIAGNOSIS — Z1389 Encounter for screening for other disorder: Secondary | ICD-10-CM | POA: Diagnosis not present

## 2020-12-28 DIAGNOSIS — M1990 Unspecified osteoarthritis, unspecified site: Secondary | ICD-10-CM | POA: Diagnosis not present

## 2020-12-28 DIAGNOSIS — I700 Atherosclerosis of aorta: Secondary | ICD-10-CM | POA: Diagnosis not present

## 2020-12-28 DIAGNOSIS — E7800 Pure hypercholesterolemia, unspecified: Secondary | ICD-10-CM | POA: Diagnosis not present

## 2021-01-03 DIAGNOSIS — M79642 Pain in left hand: Secondary | ICD-10-CM | POA: Diagnosis not present

## 2021-01-11 DIAGNOSIS — R001 Bradycardia, unspecified: Secondary | ICD-10-CM | POA: Diagnosis not present

## 2021-01-12 DIAGNOSIS — I129 Hypertensive chronic kidney disease with stage 1 through stage 4 chronic kidney disease, or unspecified chronic kidney disease: Secondary | ICD-10-CM | POA: Diagnosis not present

## 2021-01-12 DIAGNOSIS — M169 Osteoarthritis of hip, unspecified: Secondary | ICD-10-CM | POA: Diagnosis not present

## 2021-01-12 DIAGNOSIS — N1831 Chronic kidney disease, stage 3a: Secondary | ICD-10-CM | POA: Diagnosis not present

## 2021-01-12 DIAGNOSIS — E7800 Pure hypercholesterolemia, unspecified: Secondary | ICD-10-CM | POA: Diagnosis not present

## 2021-01-12 DIAGNOSIS — I10 Essential (primary) hypertension: Secondary | ICD-10-CM | POA: Diagnosis not present

## 2021-01-13 DIAGNOSIS — S02839D Fracture of medial orbital wall, unspecified side, subsequent encounter for fracture with routine healing: Secondary | ICD-10-CM | POA: Diagnosis not present

## 2021-01-24 DIAGNOSIS — N182 Chronic kidney disease, stage 2 (mild): Secondary | ICD-10-CM | POA: Diagnosis not present

## 2021-01-24 DIAGNOSIS — I700 Atherosclerosis of aorta: Secondary | ICD-10-CM | POA: Diagnosis not present

## 2021-01-24 DIAGNOSIS — I10 Essential (primary) hypertension: Secondary | ICD-10-CM | POA: Diagnosis not present

## 2021-01-25 DIAGNOSIS — H43813 Vitreous degeneration, bilateral: Secondary | ICD-10-CM | POA: Diagnosis not present

## 2021-02-14 DIAGNOSIS — Q800 Ichthyosis vulgaris: Secondary | ICD-10-CM | POA: Diagnosis not present

## 2021-02-24 DIAGNOSIS — M7521 Bicipital tendinitis, right shoulder: Secondary | ICD-10-CM | POA: Diagnosis not present

## 2021-02-24 DIAGNOSIS — R202 Paresthesia of skin: Secondary | ICD-10-CM | POA: Diagnosis not present

## 2021-03-03 DIAGNOSIS — C61 Malignant neoplasm of prostate: Secondary | ICD-10-CM | POA: Diagnosis not present

## 2021-03-06 DIAGNOSIS — M25511 Pain in right shoulder: Secondary | ICD-10-CM | POA: Diagnosis not present

## 2021-03-08 ENCOUNTER — Other Ambulatory Visit: Payer: Self-pay | Admitting: Physician Assistant

## 2021-03-13 ENCOUNTER — Encounter: Payer: Self-pay | Admitting: Neurology

## 2021-03-14 DIAGNOSIS — M79601 Pain in right arm: Secondary | ICD-10-CM | POA: Diagnosis not present

## 2021-03-14 DIAGNOSIS — R2231 Localized swelling, mass and lump, right upper limb: Secondary | ICD-10-CM | POA: Diagnosis not present

## 2021-03-14 DIAGNOSIS — G5603 Carpal tunnel syndrome, bilateral upper limbs: Secondary | ICD-10-CM | POA: Diagnosis not present

## 2021-03-15 DIAGNOSIS — M1990 Unspecified osteoarthritis, unspecified site: Secondary | ICD-10-CM | POA: Diagnosis not present

## 2021-03-15 DIAGNOSIS — I10 Essential (primary) hypertension: Secondary | ICD-10-CM | POA: Diagnosis not present

## 2021-03-15 DIAGNOSIS — I129 Hypertensive chronic kidney disease with stage 1 through stage 4 chronic kidney disease, or unspecified chronic kidney disease: Secondary | ICD-10-CM | POA: Diagnosis not present

## 2021-03-15 DIAGNOSIS — C61 Malignant neoplasm of prostate: Secondary | ICD-10-CM | POA: Diagnosis not present

## 2021-03-15 DIAGNOSIS — M169 Osteoarthritis of hip, unspecified: Secondary | ICD-10-CM | POA: Diagnosis not present

## 2021-03-15 DIAGNOSIS — E7800 Pure hypercholesterolemia, unspecified: Secondary | ICD-10-CM | POA: Diagnosis not present

## 2021-03-15 DIAGNOSIS — Z8546 Personal history of malignant neoplasm of prostate: Secondary | ICD-10-CM | POA: Diagnosis not present

## 2021-03-15 DIAGNOSIS — N183 Chronic kidney disease, stage 3 unspecified: Secondary | ICD-10-CM | POA: Diagnosis not present

## 2021-03-20 DIAGNOSIS — N400 Enlarged prostate without lower urinary tract symptoms: Secondary | ICD-10-CM | POA: Diagnosis not present

## 2021-03-20 DIAGNOSIS — Z8546 Personal history of malignant neoplasm of prostate: Secondary | ICD-10-CM | POA: Diagnosis not present

## 2021-03-23 DIAGNOSIS — G5622 Lesion of ulnar nerve, left upper limb: Secondary | ICD-10-CM | POA: Diagnosis not present

## 2021-03-23 DIAGNOSIS — G6282 Radiation-induced polyneuropathy: Secondary | ICD-10-CM | POA: Diagnosis not present

## 2021-03-23 DIAGNOSIS — G5603 Carpal tunnel syndrome, bilateral upper limbs: Secondary | ICD-10-CM | POA: Diagnosis not present

## 2021-03-23 DIAGNOSIS — G609 Hereditary and idiopathic neuropathy, unspecified: Secondary | ICD-10-CM | POA: Diagnosis not present

## 2021-03-29 DIAGNOSIS — M79601 Pain in right arm: Secondary | ICD-10-CM | POA: Diagnosis not present

## 2021-04-03 ENCOUNTER — Other Ambulatory Visit: Payer: Self-pay

## 2021-04-03 DIAGNOSIS — R202 Paresthesia of skin: Secondary | ICD-10-CM

## 2021-04-04 ENCOUNTER — Other Ambulatory Visit: Payer: Self-pay

## 2021-04-04 ENCOUNTER — Ambulatory Visit: Payer: Medicare HMO | Admitting: Neurology

## 2021-04-04 DIAGNOSIS — M5412 Radiculopathy, cervical region: Secondary | ICD-10-CM

## 2021-04-04 DIAGNOSIS — R202 Paresthesia of skin: Secondary | ICD-10-CM | POA: Diagnosis not present

## 2021-04-04 DIAGNOSIS — G5621 Lesion of ulnar nerve, right upper limb: Secondary | ICD-10-CM

## 2021-04-04 DIAGNOSIS — G5601 Carpal tunnel syndrome, right upper limb: Secondary | ICD-10-CM

## 2021-04-04 NOTE — Procedures (Signed)
[facility]  [address], [address]  [place], [place] [address] Tel: [phone] Fax:  [phone] Test Date:  04/04/2021  Patient: Dennis Huffman DOB: August 25, 1950 Physician: Narda Amber, DO  Sex: Male Height: 5\' 11"  Ref Phys: Faythe Casa, MD  ID#: [id number]   Technician:    Patient Complaints: This is a 70 year old man referred for evaluation of right arm pain and paresthesias.  NCV & EMG Findings: Extensive electrodiagnostic testing of the right upper extremity shows:  Right median and ulnar sensory responses show prolonged latency (R3.9, R3.4 ms). Right median motor responses within normal limits.  Right ulnar motor response shows slowed conduction velocity across the elbow (A Elbow-B Elbow, 36 m/s).   Chronic motor axonal loss changes are seen affecting the C5 myotome as well as the ulnar innervated muscles on the right.  Impression: Chronic C5 radiculopathy affecting the right upper extremity, moderate. Right ulnar neuropathy with slowing across the elbow, demyelinating, moderate. Right median neuropathy at or distal to the wrist (mild), consistent with a clinical diagnosis of carpal tunnel syndrome.     ___________________________ Narda Amber, DO    Nerve Conduction Studies Anti Sensory Summary Table   Stim Site NR Peak (ms) Norm Peak (ms) P-T Amp (V) Norm P-T Amp  Right Median Anti Sensory (2nd Digit)  Wrist    3.9 <3.8 12.6 >10  Right Ulnar Anti Sensory (5th Digit)  Wrist    3.4 <3.2 12.1 >5   Motor Summary Table   Stim Site NR Onset (ms) Norm Onset (ms) O-P Amp (mV) Norm O-P Amp Site1 Site2 Delta-0 (ms) Dist (cm) Vel (m/s) Norm Vel (m/s)  Right Median Motor (Abd Poll Brev)  Wrist    3.6 <4.0 11.1 >5 Elbow Wrist 5.8 31.0 53 >50  Elbow    9.4  10.6         Right Ulnar Motor (Abd Dig Minimi)  Wrist    3.0 <3.1 9.1 >7 B Elbow Wrist 4.7 24.0 51 >50  B Elbow    7.7  8.7  A Elbow B Elbow 2.8 10.0 36 >50  A Elbow    10.5  8.1          EMG    Side Muscle Ins Act Fibs Psw Fasc Number Recrt Dur Dur. Amp Amp. Poly Poly. Comment  Right 1stDorInt Nml Nml Nml Nml 2- Rapid Some 1+ Some 1+ Some 1+ N/A  Right Abd Poll Brev Nml Nml Nml Nml Nml Nml Nml Nml Nml Nml Nml Nml N/A  Right PronatorTeres Nml Nml Nml Nml Nml Nml Nml Nml Nml Nml Nml Nml N/A  Right Biceps Nml Nml Nml Nml 2- Rapid Most 1+ Most 1+ Most 1+ N/A  Right Triceps Nml Nml Nml Nml Nml Nml Nml Nml Nml Nml Nml Nml N/A  Right Deltoid Nml Nml Nml Nml 2- Rapid Most 1+ Most 1+ Most 1+ N/A  Right ABD Dig Min Nml Nml Nml Nml 2- Rapid Some 1+ Some 1+ Some 1+ N/A  Right FlexCarpiUln Nml Nml Nml Nml 1- Rapid Some 1+ Some 1+ Some 1+ N/A      Waveforms:

## 2021-04-05 DIAGNOSIS — G5602 Carpal tunnel syndrome, left upper limb: Secondary | ICD-10-CM | POA: Diagnosis not present

## 2021-04-10 DIAGNOSIS — M79642 Pain in left hand: Secondary | ICD-10-CM | POA: Diagnosis not present

## 2021-04-19 DIAGNOSIS — S46111A Strain of muscle, fascia and tendon of long head of biceps, right arm, initial encounter: Secondary | ICD-10-CM | POA: Diagnosis not present

## 2021-04-19 DIAGNOSIS — G5603 Carpal tunnel syndrome, bilateral upper limbs: Secondary | ICD-10-CM | POA: Diagnosis not present

## 2021-04-19 DIAGNOSIS — M79601 Pain in right arm: Secondary | ICD-10-CM | POA: Diagnosis not present

## 2021-05-10 DIAGNOSIS — Z8546 Personal history of malignant neoplasm of prostate: Secondary | ICD-10-CM | POA: Diagnosis not present

## 2021-05-10 DIAGNOSIS — I130 Hypertensive heart and chronic kidney disease with heart failure and stage 1 through stage 4 chronic kidney disease, or unspecified chronic kidney disease: Secondary | ICD-10-CM | POA: Diagnosis not present

## 2021-05-10 DIAGNOSIS — Z23 Encounter for immunization: Secondary | ICD-10-CM | POA: Diagnosis not present

## 2021-05-10 DIAGNOSIS — E669 Obesity, unspecified: Secondary | ICD-10-CM | POA: Diagnosis not present

## 2021-05-10 DIAGNOSIS — E559 Vitamin D deficiency, unspecified: Secondary | ICD-10-CM | POA: Diagnosis not present

## 2021-05-10 DIAGNOSIS — Z0001 Encounter for general adult medical examination with abnormal findings: Secondary | ICD-10-CM | POA: Diagnosis not present

## 2021-05-10 DIAGNOSIS — I509 Heart failure, unspecified: Secondary | ICD-10-CM | POA: Diagnosis not present

## 2021-05-10 DIAGNOSIS — D649 Anemia, unspecified: Secondary | ICD-10-CM | POA: Diagnosis not present

## 2021-05-10 DIAGNOSIS — Z7189 Other specified counseling: Secondary | ICD-10-CM | POA: Diagnosis not present

## 2021-05-10 DIAGNOSIS — Z79899 Other long term (current) drug therapy: Secondary | ICD-10-CM | POA: Diagnosis not present

## 2021-05-10 DIAGNOSIS — E785 Hyperlipidemia, unspecified: Secondary | ICD-10-CM | POA: Diagnosis not present

## 2021-05-10 DIAGNOSIS — F17211 Nicotine dependence, cigarettes, in remission: Secondary | ICD-10-CM | POA: Diagnosis not present

## 2021-05-11 DIAGNOSIS — L810 Postinflammatory hyperpigmentation: Secondary | ICD-10-CM | POA: Diagnosis not present

## 2021-05-11 DIAGNOSIS — Q800 Ichthyosis vulgaris: Secondary | ICD-10-CM | POA: Diagnosis not present

## 2021-05-22 DIAGNOSIS — I129 Hypertensive chronic kidney disease with stage 1 through stage 4 chronic kidney disease, or unspecified chronic kidney disease: Secondary | ICD-10-CM | POA: Diagnosis not present

## 2021-05-22 DIAGNOSIS — M1990 Unspecified osteoarthritis, unspecified site: Secondary | ICD-10-CM | POA: Diagnosis not present

## 2021-05-22 DIAGNOSIS — M169 Osteoarthritis of hip, unspecified: Secondary | ICD-10-CM | POA: Diagnosis not present

## 2021-05-22 DIAGNOSIS — E7800 Pure hypercholesterolemia, unspecified: Secondary | ICD-10-CM | POA: Diagnosis not present

## 2021-05-22 DIAGNOSIS — N182 Chronic kidney disease, stage 2 (mild): Secondary | ICD-10-CM | POA: Diagnosis not present

## 2021-05-22 DIAGNOSIS — I10 Essential (primary) hypertension: Secondary | ICD-10-CM | POA: Diagnosis not present

## 2021-05-23 ENCOUNTER — Other Ambulatory Visit: Payer: Self-pay

## 2021-05-23 ENCOUNTER — Encounter (HOSPITAL_COMMUNITY): Payer: Self-pay | Admitting: Emergency Medicine

## 2021-05-23 ENCOUNTER — Emergency Department (HOSPITAL_COMMUNITY)
Admission: EM | Admit: 2021-05-23 | Discharge: 2021-05-23 | Disposition: A | Discharge status: Home / Self Care | Payer: Medicare HMO | Attending: Emergency Medicine | Admitting: Emergency Medicine

## 2021-05-23 DIAGNOSIS — I129 Hypertensive chronic kidney disease with stage 1 through stage 4 chronic kidney disease, or unspecified chronic kidney disease: Secondary | ICD-10-CM | POA: Diagnosis not present

## 2021-05-23 DIAGNOSIS — Z96642 Presence of left artificial hip joint: Secondary | ICD-10-CM | POA: Insufficient documentation

## 2021-05-23 DIAGNOSIS — M79659 Pain in unspecified thigh: Secondary | ICD-10-CM | POA: Diagnosis not present

## 2021-05-23 DIAGNOSIS — N182 Chronic kidney disease, stage 2 (mild): Secondary | ICD-10-CM | POA: Diagnosis not present

## 2021-05-23 DIAGNOSIS — Z8546 Personal history of malignant neoplasm of prostate: Secondary | ICD-10-CM | POA: Diagnosis not present

## 2021-05-23 DIAGNOSIS — Z79899 Other long term (current) drug therapy: Secondary | ICD-10-CM | POA: Insufficient documentation

## 2021-05-23 DIAGNOSIS — M545 Low back pain, unspecified: Secondary | ICD-10-CM | POA: Diagnosis not present

## 2021-05-23 DIAGNOSIS — Z87891 Personal history of nicotine dependence: Secondary | ICD-10-CM | POA: Diagnosis not present

## 2021-05-23 DIAGNOSIS — Z7982 Long term (current) use of aspirin: Secondary | ICD-10-CM | POA: Diagnosis not present

## 2021-05-23 LAB — URINALYSIS, ROUTINE W REFLEX MICROSCOPIC
Bilirubin Urine: NEGATIVE
Glucose, UA: NEGATIVE mg/dL
Hgb urine dipstick: NEGATIVE
Ketones, ur: NEGATIVE mg/dL
Leukocytes,Ua: NEGATIVE
Nitrite: NEGATIVE
Protein, ur: NEGATIVE mg/dL
Specific Gravity, Urine: 1.02 (ref 1.005–1.030)
pH: 6 (ref 5.0–8.0)

## 2021-05-23 NOTE — ED Triage Notes (Addendum)
Pt states, "my kidneys hurt."  C/o L lower back pain x 1-2 weeks.  Denies urinary complaints.  States pain is positional.

## 2021-05-23 NOTE — ED Notes (Signed)
Pt provided discharge instructions and prescription information. Pt was given the opportunity to ask questions and questions were answered. Discharge signature not obtained in the setting of the COVID-19 pandemic in order to reduce high touch surfaces.  ° °

## 2021-05-23 NOTE — ED Provider Notes (Signed)
[facility] EMERGENCY DEPARTMENT Provider Note   CSN: [id number] Arrival date & time: 05/23/21  0909     History Chief Complaint  Patient presents with   Flank Pain    Dennis Huffman is a 70 y.o. male w/ PMHx of CKD, prostate cancer, and arthritis presents 2/2 4-5 days of lower back pain. He does not remember any specific injury to his back and initially worried about his kidneys. He denies current pain. Pain occurs with certain positions and movement. He does have some intermittent anterior thigh pain as well but does not radiate from his back. Denies issue with urination or bowel movements. Also denies fever and other sick symptoms. He has not tried anything for pain relief.    Flank Pain Pertinent negatives include no chest pain, no abdominal pain, no headaches and no shortness of breath.      Past Medical History:  Diagnosis Date   Arthritis    cervical spondylotic, radiculopathy   Bradycardia    a. HR previously reported as 40 at PCP's office; event monitor 2017 showed nocturnal bradycardia but no daytime findings.   CKD (chronic kidney disease), stage II    Hypercholesteremia    Hypertension    Premature atrial contractions    Prostate cancer (HCC)    PVC's (premature ventricular contractions)     Patient Active Problem List   Diagnosis Date Noted   Degenerative joint disease of left hip 11/28/2018   Prostate cancer ([place]) 08/29/2016   Neck pain 05/12/2015   Bradycardia 04/21/2014   Hyperlipidemia 04/21/2014    Past Surgical History:  Procedure Laterality Date   ANTERIOR CERVICAL DECOMP/DISCECTOMY FUSION N/A 05/12/2015   Procedure: ANTERIOR CERVICAL DECOMPRESSION/DISCECTOMY FUSION C3 - C6 3 LEVELS;  Surgeon: Melina Schools, MD;  Location: [facility];  Service: Orthopedics;  Laterality: N/A;   colonscopy     COLONSCOPY      INSERTION PROSTATE RADIATION SEED  UNSURE OF DATE   DONE BY DR Karsten Ro    PROSTATE BIOPSY     ROTATOR CUFF REPAIR Right  03-13-12; 09-27-15   TOTAL HIP ARTHROPLASTY Left 11/28/2018   Procedure: TOTAL HIP ARTHROPLASTY;  Surgeon: Earlie Server, MD;  Location: [facility];  Service: Orthopedics;  Laterality: Left;       Family History  Problem Relation Age of Onset   Healthy Mother    Healthy Father    Stroke Sister    Cancer Sister        breast    Social History   Tobacco Use   Smoking status: Former    Types: Cigarettes    Quit date: 06/26/1971    Years since quitting: 49.9   Smokeless tobacco: Never  Vaping Use   Vaping Use: Never used  Substance Use Topics   Alcohol use: Yes    Comment: socially    Drug use: No    Home Medications Prior to Admission medications   Medication Sig Start Date End Date Taking? Authorizing Provider  acetaminophen (TYLENOL) 650 MG CR tablet Take 650-1,300 mg by mouth every 8 (eight) hours as needed for pain.    [provider]  amLODipine (NORVASC) 5 MG tablet Take 5 mg by mouth every evening.    [provider]  aspirin EC 81 MG tablet Take 1 tablet (81 mg total) by mouth daily. Swallow whole. 09/26/20   Jettie Booze, MD  atorvastatin (LIPITOR) 10 MG tablet Take 10 mg by mouth daily.    [provider]  losartan (COZAAR) 100 MG tablet Take 100 mg by mouth daily.    [provider]  magnesium oxide (MAG-OX) 400 MG tablet Take 400 mg by mouth daily.    [provider]  Melatonin 10 MG CAPS Take 10-20 mg by mouth at bedtime.    [provider]  metoprolol succinate (TOPROL-XL) 25 MG 24 hr tablet TAKE 1 TABLET EVERY DAY 03/09/21   Jettie Booze, MD  Multiple Vitamins-Minerals [facility] FOR HIM 50+) TABS Take 1 tablet by mouth daily.    [provider]  Nutritional Supplements (IMMUNE ENHANCE) TABS Take 1 tablet by mouth daily.    [provider]  predniSONE (DELTASONE) 20 MG tablet Take 2 tablets (40 mg total) by mouth daily. 11/23/20   Blanchie Dessert, MD    Allergies     Hydrocodone-acetaminophen, Ramipril, and Hydrocodone  Review of Systems   Review of Systems  Constitutional:  Positive for activity change. Negative for appetite change, chills and fever.  HENT:  Negative for congestion, rhinorrhea and sore throat.   Eyes:  Negative for visual disturbance.  Respiratory:  Negative for cough and shortness of breath.   Cardiovascular:  Negative for chest pain.  Gastrointestinal:  Negative for abdominal pain, constipation, diarrhea, nausea and vomiting.  Endocrine: Negative for polyuria.  Genitourinary:  Positive for flank pain. Negative for decreased urine volume and difficulty urinating.  Musculoskeletal:  Positive for arthralgias. Negative for myalgias.  Neurological:  Negative for headaches.  Psychiatric/Behavioral:  Negative for confusion.    Physical Exam Updated Vital Signs BP 139/84   Pulse 75   Temp 98.6 F (37 C) (Oral)   Resp 16   SpO2 98%   Physical Exam Vitals and nursing note reviewed.  Constitutional:      General: He is not in acute distress.    Appearance: Normal appearance. He is not ill-appearing, toxic-appearing or diaphoretic.  HENT:     Head: Normocephalic.     Right Ear: External ear normal.     Left Ear: External ear normal.     Nose: Nose normal.  Eyes:     General: No scleral icterus.    Conjunctiva/sclera: Conjunctivae normal.  Cardiovascular:     Rate and Rhythm: Normal rate and regular rhythm.     Heart sounds: Normal heart sounds.  Pulmonary:     Effort: Pulmonary effort is normal. No respiratory distress.     Breath sounds: Normal breath sounds. No wheezing or rhonchi.  Abdominal:     General: Abdomen is flat. Bowel sounds are normal.     Palpations: Abdomen is soft.     Tenderness: There is no abdominal tenderness. There is no right CVA tenderness, left CVA tenderness or guarding.  Musculoskeletal:        General: No swelling or tenderness.     Right lower leg: No edema.     Left lower leg: No edema.   Neurological:     Mental Status: He is alert and oriented to person, place, and time.     Gait: Gait normal.     Deep Tendon Reflexes: Reflexes normal.  Psychiatric:        Mood and Affect: Mood normal.        Behavior: Behavior normal.    ED Results / Procedures / Treatments   Labs (all labs ordered are listed, but only abnormal results are displayed) Labs Reviewed  URINALYSIS, ROUTINE W REFLEX MICROSCOPIC    EKG None  Radiology No results found.  Procedures  Procedures   Medications Ordered in ED Medications - No data to display  ED Course  I have reviewed the triage vital signs and the nursing notes.  Pertinent labs & imaging results that were available during my care of the patient were reviewed by me and considered in my medical decision making (see chart for details).    MDM Rules/Calculators/A&P                          70 yo M w/ PMHx of CKD, prostate cancer, and arthritis presents 2/2 acute lower lumbar pain localized to the left side. It is intermittent and not currently present. He denies fever and issue with bowel/bladder.   VSS. He is not in acute distress and is ambulating on his own. On exam, left lower back is non-tender to palpation/essentially non-reproducible pain. He is also without CVA tenderness. Lower extremity DTRs appropriate. No LE edema or tenderness. UA wnl.   History of sciatica or meralgia unclear. At this time he is not experiencing pain and there is no emergent treatment need at this time.   Patient expressed wanting to see a kidney doctor to Dr. Vanita Panda. Discussed following up with primary care doctor and office number for [facility] given to patient to scheduled a visit if desired.   Patient discharged in stable condition.   Final Clinical Impression(s) / ED Diagnoses Final diagnoses:  Acute left-sided low back pain without sciatica    Rx / DC Orders ED Discharge Orders     None        Gerlene Fee,  DO 05/23/21 1535    Carmin Muskrat, MD 05/27/21 1426

## 2021-05-23 NOTE — ED Provider Notes (Signed)
Emergency Medicine Provider Triage Evaluation Note  Dennis Huffman , a 70 y.o. male  was evaluated in triage.  Pt complains of low left-sided back pain for the past 3 to 4 days.  Patient reports "my kidneys hurt" and points to his lower back just above his hip bones.  Patient ports he was told previously he had CKD.  He denies any traumas, falls, or MVC's.  Denies any fevers.  Denies any urinary symptoms.  Denies any radiation.  Review of Systems  Positive: Back pain  Negative: Fevers, urinary symptoms  Physical Exam  BP (!) 155/86 (BP Location: Left Arm)   Pulse 73   Temp 98.6 F (37 C) (Oral)   Resp 14   SpO2 99%  Gen:   Awake, no distress   Resp:  Normal effort  MSK:   Moves extremities without difficulty  Other:  Mild tenderness palpation of left paraspinal lumbar.  No midline tenderness.  Medical Decision Making  Medically screening exam initiated at 10:05 AM.  Appropriate orders placed.  NAT LOWENTHAL was informed that the remainder of the evaluation will be completed by another provider, this initial triage assessment does not replace that evaluation, and the importance of remaining in the ED until their evaluation is complete.  Urinalysis ordered.   Sherrell Puller, PA-C 05/23/21 1007    Carmin Muskrat, MD 05/27/21 1426

## 2021-05-23 NOTE — Discharge Instructions (Signed)
Today we saw you for lower back pain. Your vitals are normal and urine study was normal. Please follow up with your primary care doctor and we have given you the number to call the kidney doctor to schedule a visit.

## 2021-05-24 ENCOUNTER — Other Ambulatory Visit: Payer: Self-pay | Admitting: Physician Assistant

## 2021-05-24 DIAGNOSIS — R109 Unspecified abdominal pain: Secondary | ICD-10-CM | POA: Diagnosis not present

## 2021-07-25 ENCOUNTER — Other Ambulatory Visit: Payer: Self-pay

## 2021-07-25 DIAGNOSIS — R202 Paresthesia of skin: Secondary | ICD-10-CM

## 2021-08-15 ENCOUNTER — Other Ambulatory Visit: Payer: Self-pay | Admitting: Internal Medicine

## 2021-08-15 ENCOUNTER — Ambulatory Visit
Admission: RE | Admit: 2021-08-15 | Discharge: 2021-08-15 | Disposition: A | Discharge status: Home / Self Care | Payer: Medicare HMO | Source: Ambulatory Visit | Attending: Internal Medicine | Admitting: Internal Medicine

## 2021-08-15 ENCOUNTER — Other Ambulatory Visit: Payer: Self-pay

## 2021-08-15 DIAGNOSIS — M778 Other enthesopathies, not elsewhere classified: Secondary | ICD-10-CM

## 2021-08-29 ENCOUNTER — Ambulatory Visit: Payer: Medicare HMO | Admitting: Neurology

## 2021-08-29 ENCOUNTER — Other Ambulatory Visit: Payer: Self-pay

## 2021-08-29 DIAGNOSIS — G5622 Lesion of ulnar nerve, left upper limb: Secondary | ICD-10-CM

## 2021-08-29 DIAGNOSIS — R202 Paresthesia of skin: Secondary | ICD-10-CM | POA: Diagnosis not present

## 2021-08-29 NOTE — Procedures (Signed)
[facility]  ?[address], [address] ? [place], [place] [address] ?Tel: [phone] ?Fax:  [phone] ?Test Date:  08/29/2021 ? ?Patient: Dennis Huffman DOB: Oct 13, 1950 Physician: Narda Amber, DO  ?Sex: Male Height: '5\' 4"'$  Ref Phys: Faythe Casa, MD  ?ID#: [id number]   Technician:   ? ?Patient Complaints: ?This is a 71 year old man referred for evaluation of left hand paresthesias. ? ?NCV & EMG Findings: ?Extensive electrodiagnostic testing of the left upper extremity shows:  ?Left median sensory response is within normal limits.  Left ulnar sensory response shows prolonged latency (3.5 ms).  Left mixed palmar sensory study shows absent ulnar response. ?Left median motor responses within normal limits.  Left ulnar motor response shows reduced amplitude at the abductor digiti minimi and first dorsal interosseous muscles (L6.8, L5.3 ms), slowed conduction elocity (A Elbow-B Elbow, L48, L45 m/s), and prolonged latency (4.6 ms) at the first dorsal interosseous muscle.  ?Chronic motor axonal loss changes are seen affecting the ulnar innervated muscles, without accompanying active denervation.  ? ?Impression: ?Left ulnar neuropathy across the elbow with demyelinating and axonal features, moderate. ?There is no evidence of carpal tunnel syndrome or cervical radiculopathy affecting the left upper extremity. ? ? ?___________________________ ?Narda Amber, DO ? ? ? ?Nerve Conduction Studies ?Anti Sensory Summary Table ? ? Stim Site NR Peak (ms) Norm Peak (ms) P-T Amp (?V) Norm P-T Amp  ?Left Median Anti Sensory (2nd Digit)  32?C  ?Wrist    3.3 <3.8 23.0 >10  ?Left Ulnar Anti Sensory (5th Digit)  32?C  ?Wrist    3.5 <3.2 6.2 >5  ? ?Motor Summary Table ? ? Stim Site NR Onset (ms) Norm Onset (ms) O-P Amp (mV) Norm O-P Amp Site1 Site2 Delta-0 (ms) Dist (cm) Vel (m/s) Norm Vel (m/s)  ?Left Median Motor (Abd Poll Brev)  32?C  ?Wrist    3.1 <4.0 12.4 >5 Elbow Wrist 5.5 34.0 62 >50  ?Elbow    8.6  11.4         ?Left  Ulnar Motor (Abd Dig Minimi)  32?C  ?Wrist    2.8 <3.1 6.8 >7 B Elbow Wrist 4.5 26.0 58 >50  ?B Elbow    7.3  4.5  A Elbow B Elbow 2.1 10.0 48 >50  ?A Elbow    9.4  4.5         ?Left Ulnar (FDI) Motor (1st DI)  32?C  ?Wrist    4.6 <4.5 5.3 >7 B Elbow Wrist 4.5 26.0 58 >50  ?B Elbow    9.1  4.6  A Elbow B Elbow 2.2 10.0 45 >50  ?A Elbow    11.3  4.4         ? ?Comparison Summary Table ? ? Stim Site NR Peak (ms) Norm Peak (ms) P-T Amp (?V) Site1 Site2 Delta-P (ms) Norm Delta (ms)  ?Left Median/Ulnar Palm Comparison (Wrist - 8cm)  32?C  ?Median Palm    1.9 <2.2 18.0 Median Palm Ulnar Palm    ?Ulnar Palm NR  <2.2       ? ?EMG ? ? Side Muscle Ins Act Fibs Psw Fasc Number Recrt Dur Dur. Amp Amp. Poly Poly. Comment  ?Left 1stDorInt Nml Nml Nml Nml 2- Rapid Many 1+ Many 1+ Many 1+ N/A  ?Left PronatorTeres Nml Nml Nml Nml Nml Nml Nml Nml Nml Nml Nml Nml N/A  ?Left Biceps Nml Nml Nml Nml Nml Nml Nml Nml Nml Nml Nml Nml N/A  ?Left Triceps Nml Nml  Nml Nml Nml Nml Nml Nml Nml Nml Nml Nml N/A  ?Left Deltoid Nml Nml Nml Nml Nml Nml Nml Nml Nml Nml Nml Nml N/A  ?Left ABD Dig Min Nml Nml Nml Nml 2- Rapid Many 1+ Many 1+ Many 1+ N/A  ?Left FlexCarpiUln Nml Nml Nml Nml 2- Rapid Many 1+ Some 1+ Many 1+ N/A  ? ? ? ? ?Waveforms: ?    ? ?    ? ? ?

## 2021-09-12 ENCOUNTER — Other Ambulatory Visit: Payer: Self-pay | Admitting: Internal Medicine

## 2021-09-12 ENCOUNTER — Ambulatory Visit
Admission: RE | Admit: 2021-09-12 | Discharge: 2021-09-12 | Disposition: A | Discharge status: Home / Self Care | Payer: Medicare HMO | Source: Ambulatory Visit | Attending: Internal Medicine | Admitting: Internal Medicine

## 2021-09-12 DIAGNOSIS — R202 Paresthesia of skin: Secondary | ICD-10-CM

## 2021-09-14 ENCOUNTER — Ambulatory Visit (HOSPITAL_COMMUNITY): Payer: Medicare HMO

## 2021-10-18 ENCOUNTER — Other Ambulatory Visit: Payer: Self-pay | Admitting: Interventional Cardiology

## 2021-11-30 ENCOUNTER — Encounter: Payer: Medicare HMO | Admitting: Psychology

## 2022-01-01 DIAGNOSIS — Z8546 Personal history of malignant neoplasm of prostate: Secondary | ICD-10-CM | POA: Diagnosis not present

## 2022-01-01 DIAGNOSIS — N1831 Chronic kidney disease, stage 3a: Secondary | ICD-10-CM | POA: Diagnosis not present

## 2022-01-01 DIAGNOSIS — I10 Essential (primary) hypertension: Secondary | ICD-10-CM | POA: Diagnosis not present

## 2022-01-01 DIAGNOSIS — R7303 Prediabetes: Secondary | ICD-10-CM | POA: Diagnosis not present

## 2022-01-01 DIAGNOSIS — E7800 Pure hypercholesterolemia, unspecified: Secondary | ICD-10-CM | POA: Diagnosis not present

## 2022-01-01 DIAGNOSIS — I700 Atherosclerosis of aorta: Secondary | ICD-10-CM | POA: Diagnosis not present

## 2022-01-01 DIAGNOSIS — M5090 Cervical disc disorder, unspecified, unspecified cervical region: Secondary | ICD-10-CM | POA: Diagnosis not present

## 2022-01-01 DIAGNOSIS — K219 Gastro-esophageal reflux disease without esophagitis: Secondary | ICD-10-CM | POA: Diagnosis not present

## 2022-01-01 DIAGNOSIS — Z1331 Encounter for screening for depression: Secondary | ICD-10-CM | POA: Diagnosis not present

## 2022-01-01 DIAGNOSIS — Z0000 Encounter for general adult medical examination without abnormal findings: Secondary | ICD-10-CM | POA: Diagnosis not present

## 2022-01-09 NOTE — Progress Notes (Signed)
Cardiology Office Note   Date:  01/10/2022   ID:  Dennis Huffman, DOB 1950-09-12, MRN [id number]  PCP:  [name], MD    No chief complaint on file.  PVCs  Wt Readings from Last 3 Encounters:  01/10/22 207 lb (93.9 kg)  09/15/20 220 lb (99.8 kg)  08/18/20 217 lb 6.4 oz (98.6 kg)       History of Present Illness: Dennis Huffman is a 71 y.o. male  who had arm pain and was given hydrocodone in 2014.  At that time, he he noticed some palpitations so he stopped the medicine.  The palpitations then resolved.   ECG was done with Dr. Lysle Rubens and his HR was slow, per his report 40 bpm.  Due to his age, he was referred here to be evaluated for bradycardia and was seen in 2015.  He had a negative w/u at that time.    He no longer exercises regularly.  He had a hip replacement in 2016, neck operation in 2016 and then a right shoulder operation  Having some occasional skipped beats sensation.  It has decreased.    He avoids the pain meds now.     He had Prostate Cancer and had radiation. Testosterone was not recommended.     7/21 CTA aorta: "Greatest diameter of the ascending aorta on this non gated CT chest is estimated 37 mm. No comparison cross-sectional imaging available.   Small hiatal hernia   Minimal atherosclerosis of the thoracic aorta. Aortic Atherosclerosis (ICD10-I70.0)."   In early 2022, he has lost weight with portion control.   Had some dizziness in Jan 2022 after trying a memory supplement he ordered from a TV ad.  He stopped the medicine and sent it back to the company.   Denies : Chest pain. Dizziness. Leg edema. Nitroglycerin use. Orthopnea. Palpitations. Paroxysmal nocturnal dyspnea. Shortness of breath. Syncope.    Past Medical History:  Diagnosis Date   Arthritis    cervical spondylotic, radiculopathy   Bradycardia    a. HR previously reported as 40 at PCP's office; event monitor 2017 showed nocturnal bradycardia but no daytime findings.   CKD  (chronic kidney disease), stage II    Hypercholesteremia    Hypertension    Premature atrial contractions    Prostate cancer (HCC)    PVC's (premature ventricular contractions)     Past Surgical History:  Procedure Laterality Date   ANTERIOR CERVICAL DECOMP/DISCECTOMY FUSION N/A 05/12/2015   Procedure: ANTERIOR CERVICAL DECOMPRESSION/DISCECTOMY FUSION C3 - C6 3 LEVELS;  Surgeon: Melina Schools, MD;  Location: [facility];  Service: Orthopedics;  Laterality: N/A;   colonscopy     COLONSCOPY      INSERTION PROSTATE RADIATION SEED  UNSURE OF DATE   DONE BY DR Karsten Ro    PROSTATE BIOPSY     ROTATOR CUFF REPAIR Right 03-13-12; 09-27-15   TOTAL HIP ARTHROPLASTY Left 11/28/2018   Procedure: TOTAL HIP ARTHROPLASTY;  Surgeon: Earlie Server, MD;  Location: [facility];  Service: Orthopedics;  Laterality: Left;     Current Outpatient Medications  Medication Sig Dispense Refill   acetaminophen (TYLENOL) 650 MG CR tablet Take 650-1,300 mg by mouth every 8 (eight) hours as needed for pain.     amLODipine (NORVASC) 5 MG tablet Take 5 mg by mouth every evening.     aspirin EC 81 MG tablet Take 1 tablet (81 mg total) by mouth daily. Swallow whole. 90 tablet 3   atorvastatin (LIPITOR) 10 MG tablet  Take 10 mg by mouth daily.     losartan (COZAAR) 100 MG tablet Take 100 mg by mouth daily.     magnesium oxide (MAG-OX) 400 MG tablet Take 400 mg by mouth daily.     Melatonin 10 MG CAPS Take 10-20 mg by mouth at bedtime.     metoprolol succinate (TOPROL-XL) 25 MG 24 hr tablet Take 1 tablet (25 mg total) by mouth daily. PLEASE KEEP APPOINTMENT WITH CARDIOLOGIST FOR FURTHER REFILLS 90 tablet 0   Multiple Vitamins-Minerals (MULTI FOR HIM 50+) TABS Take 1 tablet by mouth daily.     Nutritional Supplements (IMMUNE ENHANCE) TABS Take 1 tablet by mouth daily.     predniSONE (DELTASONE) 20 MG tablet Take 2 tablets (40 mg total) by mouth daily. 10 tablet 0   No current facility-administered medications for this visit.     Allergies:   Hydrocodone-acetaminophen, Ramipril, and Hydrocodone    Social History:  The patient  reports that he quit smoking about 50 years ago. His smoking use included cigarettes. He has never used smokeless tobacco. He reports current alcohol use. He reports that he does not use drugs.   Family History:  The patient's family history includes Cancer in his sister; Healthy in his father and mother; Stroke in his sister.    ROS:  Please see the history of present illness.   Otherwise, review of systems are positive for tingling in fingers of left hand; knot feeling in his right upper arm.   All other systems are reviewed and negative.    PHYSICAL EXAM: VS:  BP 120/80 (BP Location: Left Arm, Patient Position: Sitting, Cuff Size: Normal)   Pulse (!) 50   Ht '5\' 11"'$  (1.803 m)   Wt 207 lb (93.9 kg)   SpO2 97%   BMI 28.87 kg/m  , BMI Body mass index is 28.87 kg/m. GEN: Well nourished, well developed, in no acute distress HEENT: normal Neck: no JVD, carotid bruits, or masses Cardiac: bradycardic, S1S2; no murmurs, rubs, or gallops,no edema  Respiratory:  clear to auscultation bilaterally, normal work of breathing GI: soft, nontender, nondistended, + BS MS: no deformity or atrophy Skin: warm and dry, no rash Neuro:  Strength and sensation are intact Psych: euthymic mood, full affect   EKG:   The ekg ordered today demonstrates sinus bradycardia, nonspecific ST changes   Recent Labs: No results found for requested labs within last 365 days.   Lipid Panel No results found for: "CHOL", "TRIG", "HDL", "CHOLHDL", "VLDL", "LDLCALC", "LDLDIRECT"   Other studies Reviewed: Additional studies/ records that were reviewed today with results demonstrating: Total cholesterol 130 HDL 56 LDL 58 in July 2023 A1c 5.7 creatinine 1.26.   ASSESSMENT AND PLAN:  PAC/PVC: Metoprolol was changed to prn in February 2022, but he continued to take it daily despite no symptoms.  He was having some  false bradycardia when checked at the radial site due to premature beats.  Will stop daily metoprolol and allow him to take it as needed for palpitations.  Hyperlipidemia: The current medical regimen is effective;  continue present plan and medications. Hypertension: Avoid excessive salt. Aortic atherosclerosis: Whole food, plant-based diet.  High-fiber diet.  Continue atorvastatin.   Current medicines are reviewed at length with the patient today.  The patient concerns regarding his medicines were addressed.  The following changes have been made:    Labs/ tests ordered today include:  No orders of the defined types were placed in this encounter.   Recommend 150  minutes/week of aerobic exercise Low fat, low carb, high fiber diet recommended  Disposition:   FU in 1 year   Signed, Larae Grooms, MD  01/10/2022 3:35 PM    [facility] [place], [place], [place]  [address] Phone: [phone]; Fax: [phone]

## 2022-01-10 ENCOUNTER — Ambulatory Visit: Payer: Medicare HMO | Admitting: Interventional Cardiology

## 2022-01-10 ENCOUNTER — Encounter: Payer: Self-pay | Admitting: Interventional Cardiology

## 2022-01-10 VITALS — BP 120/80 | HR 50 | Ht 71.0 in | Wt 207.0 lb

## 2022-01-10 DIAGNOSIS — R001 Bradycardia, unspecified: Secondary | ICD-10-CM | POA: Diagnosis not present

## 2022-01-10 DIAGNOSIS — I10 Essential (primary) hypertension: Secondary | ICD-10-CM

## 2022-01-10 DIAGNOSIS — I491 Atrial premature depolarization: Secondary | ICD-10-CM

## 2022-01-10 DIAGNOSIS — I493 Ventricular premature depolarization: Secondary | ICD-10-CM | POA: Diagnosis not present

## 2022-01-10 DIAGNOSIS — E782 Mixed hyperlipidemia: Secondary | ICD-10-CM

## 2022-01-10 MED ORDER — METOPROLOL SUCCINATE ER 25 MG PO TB24
ORAL_TABLET | ORAL | 11 refills | Status: DC
Start: 2022-01-10 — End: 2022-06-01

## 2022-01-10 NOTE — Patient Instructions (Signed)
Medication Instructions:  Your physician has recommended you make the following change in your medication: Change metoprolol to 25 mg by mouth daily as needed for palpitations  *If you need a refill on your cardiac medications before your next appointment, please call your pharmacy*   Lab Work: none If you have labs (blood work) drawn today and your tests are completely normal, you will receive your results only by: [place] (if you have MyChart) OR A paper copy in the mail If you have any lab test that is abnormal or we need to change your treatment, we will call you to review the results.   Testing/Procedures: none   Follow-Up: At [facility], you and your health needs are our priority.  As part of our continuing mission to provide you with exceptional heart care, we have created designated Provider Care Teams.  These Care Teams include your primary Cardiologist (physician) and Advanced Practice Providers (APPs -  Physician Assistants and Nurse Practitioners) who all work together to provide you with the care you need, when you need it.  We recommend signing up for the patient portal called "MyChart".  Sign up information is provided on this After Visit Summary.  MyChart is used to connect with patients for Virtual Visits (Telemedicine).  Patients are able to view lab/test results, encounter notes, upcoming appointments, etc.  Non-urgent messages can be sent to your provider as well.   To learn more about what you can do with MyChart, go to [email or web address].    Your next appointment:   12 month(s)  The format for your next appointment:   In Person  Provider:   Larae Grooms, MD     Other Instructions    Important Information About Sugar

## 2022-01-24 DIAGNOSIS — Z79899 Other long term (current) drug therapy: Secondary | ICD-10-CM | POA: Diagnosis not present

## 2022-01-24 DIAGNOSIS — Z6828 Body mass index (BMI) 28.0-28.9, adult: Secondary | ICD-10-CM | POA: Diagnosis not present

## 2022-01-24 DIAGNOSIS — E559 Vitamin D deficiency, unspecified: Secondary | ICD-10-CM | POA: Diagnosis not present

## 2022-01-24 DIAGNOSIS — I130 Hypertensive heart and chronic kidney disease with heart failure and stage 1 through stage 4 chronic kidney disease, or unspecified chronic kidney disease: Secondary | ICD-10-CM | POA: Diagnosis not present

## 2022-01-24 DIAGNOSIS — E663 Overweight: Secondary | ICD-10-CM | POA: Diagnosis not present

## 2022-01-24 DIAGNOSIS — H9319 Tinnitus, unspecified ear: Secondary | ICD-10-CM | POA: Diagnosis not present

## 2022-01-25 ENCOUNTER — Other Ambulatory Visit: Payer: Self-pay | Admitting: Registered Nurse

## 2022-01-25 DIAGNOSIS — R17 Unspecified jaundice: Secondary | ICD-10-CM

## 2022-02-09 ENCOUNTER — Ambulatory Visit
Admission: RE | Admit: 2022-02-09 | Discharge: 2022-02-09 | Disposition: A | Discharge status: Home / Self Care | Payer: Medicare HMO | Source: Ambulatory Visit | Attending: Registered Nurse | Admitting: Registered Nurse

## 2022-02-09 DIAGNOSIS — K5730 Diverticulosis of large intestine without perforation or abscess without bleeding: Secondary | ICD-10-CM | POA: Diagnosis not present

## 2022-02-09 DIAGNOSIS — Z8546 Personal history of malignant neoplasm of prostate: Secondary | ICD-10-CM | POA: Diagnosis not present

## 2022-02-09 DIAGNOSIS — R17 Unspecified jaundice: Secondary | ICD-10-CM

## 2022-02-09 DIAGNOSIS — M1611 Unilateral primary osteoarthritis, right hip: Secondary | ICD-10-CM | POA: Diagnosis not present

## 2022-02-09 DIAGNOSIS — K429 Umbilical hernia without obstruction or gangrene: Secondary | ICD-10-CM | POA: Diagnosis not present

## 2022-02-09 MED ORDER — IOPAMIDOL (ISOVUE-300) INJECTION 61%
100.0000 mL | Freq: Once | INTRAVENOUS | Status: AC | PRN
  Administered 2022-02-09: 100 mL via INTRAVENOUS

## 2022-02-19 DIAGNOSIS — C61 Malignant neoplasm of prostate: Secondary | ICD-10-CM | POA: Diagnosis not present

## 2022-02-20 DIAGNOSIS — H31001 Unspecified chorioretinal scars, right eye: Secondary | ICD-10-CM | POA: Diagnosis not present

## 2022-04-18 ENCOUNTER — Encounter: Payer: Medicare HMO | Admitting: Psychology

## 2022-04-20 ENCOUNTER — Encounter: Payer: Medicare HMO | Attending: Psychology | Admitting: Psychology

## 2022-04-20 DIAGNOSIS — F03A0 Unspecified dementia, mild, without behavioral disturbance, psychotic disturbance, mood disturbance, and anxiety: Secondary | ICD-10-CM | POA: Insufficient documentation

## 2022-04-24 ENCOUNTER — Encounter (HOSPITAL_COMMUNITY): Payer: Self-pay

## 2022-04-24 ENCOUNTER — Emergency Department (HOSPITAL_COMMUNITY)
Admission: EM | Admit: 2022-04-24 | Discharge: 2022-04-24 | Disposition: A | Discharge status: Home / Self Care | Payer: Medicare HMO | Attending: Emergency Medicine | Admitting: Emergency Medicine

## 2022-04-24 DIAGNOSIS — Z8546 Personal history of malignant neoplasm of prostate: Secondary | ICD-10-CM | POA: Diagnosis not present

## 2022-04-24 DIAGNOSIS — I129 Hypertensive chronic kidney disease with stage 1 through stage 4 chronic kidney disease, or unspecified chronic kidney disease: Secondary | ICD-10-CM | POA: Diagnosis not present

## 2022-04-24 DIAGNOSIS — I10 Essential (primary) hypertension: Secondary | ICD-10-CM

## 2022-04-24 DIAGNOSIS — Z7982 Long term (current) use of aspirin: Secondary | ICD-10-CM | POA: Insufficient documentation

## 2022-04-24 DIAGNOSIS — N189 Chronic kidney disease, unspecified: Secondary | ICD-10-CM | POA: Insufficient documentation

## 2022-04-24 DIAGNOSIS — Z79899 Other long term (current) drug therapy: Secondary | ICD-10-CM | POA: Diagnosis not present

## 2022-04-24 DIAGNOSIS — R001 Bradycardia, unspecified: Secondary | ICD-10-CM | POA: Insufficient documentation

## 2022-04-24 DIAGNOSIS — R202 Paresthesia of skin: Secondary | ICD-10-CM | POA: Diagnosis present

## 2022-04-24 NOTE — Discharge Instructions (Signed)
You have been seen today for your complaint of high blood pressure. Home care instructions are as follows:  You should continue to take your blood pressure medications as prescribed.  You should reduce salt. Follow up with: Primary care provider as scheduled. Please seek immediate medical care if you develop any of the following symptoms: Develop a severe headache or confusion. Have unusual weakness or numbness. Feel faint. Have severe pain in your chest or abdomen. Vomit repeatedly. Have trouble breathing. At this time there does not appear to be the presence of an emergent medical condition, however there is always the potential for conditions to change. Please read and follow the below instructions.  Do not take your medicine if  develop an itchy rash, swelling in your mouth or lips, or difficulty breathing; call 911 and seek immediate emergency medical attention if this occurs.  You may review your lab tests and imaging results in their entirety on your MyChart account.  Please discuss all results of fully with your primary care provider and other specialist at your follow-up visit.  Note: Portions of this text may have been transcribed using voice recognition software. Every effort was made to ensure accuracy; however, inadvertent computerized transcription errors may still be present.

## 2022-04-24 NOTE — ED Triage Notes (Signed)
Pt states that his BP has been high x 1 week. Pt states his BP has been as high as 499 systolic. Denies headache. Pt states tingling in fingers and toes.

## 2022-04-24 NOTE — ED Provider Notes (Signed)
[facility] EMERGENCY DEPARTMENT Provider Note   CSN: [id number] Arrival date & time: 04/24/22  1333     History  Chief Complaint  Patient presents with   Hypertension    Dennis Huffman is a 71 y.o. male.  With history of hypercholesterolemia, arthritis, bradycardia, hypertension, CKD, prostate cancer presents to the ED mistakenly.  He reports he had an appointment at [facility].  On his calendar today but accidentally showed up to [address] and checked in assuming he was here for an appointment.  He states he did have an appointment yesterday with his cardiologist and also had an appointment on his calendar today.  Reports tingling in his hands and feet that have been present for approximately 1 month.  No change in his symptoms.  Denies all other symptoms.  Has been taking his medications as prescribed.  When asked if he would like to be seen in the ED for further evaluation he declined.  Also declined screening labs.   Hypertension       Home Medications Prior to Admission medications   Medication Sig Start Date End Date Taking? Authorizing Provider  acetaminophen (TYLENOL) 650 MG CR tablet Take 650-1,300 mg by mouth every 8 (eight) hours as needed for pain.    [provider]  amLODipine (NORVASC) 5 MG tablet Take 5 mg by mouth every evening.    [provider]  aspirin EC 81 MG tablet Take 1 tablet (81 mg total) by mouth daily. Swallow whole. 09/26/20   Jettie Booze, MD  atorvastatin (LIPITOR) 10 MG tablet Take 10 mg by mouth daily.    [provider]  losartan (COZAAR) 100 MG tablet Take 100 mg by mouth daily.    [provider]  magnesium oxide (MAG-OX) 400 MG tablet Take 400 mg by mouth daily.    [provider]  Melatonin 10 MG CAPS Take 10-20 mg by mouth at bedtime.    [provider]  metoprolol succinate (TOPROL XL) 25 MG 24 hr tablet Take one tablet by mouth daily as needed for  palpitations 01/10/22   Jettie Booze, MD  Multiple Vitamins-Minerals [facility] FOR HIM 50+) TABS Take 1 tablet by mouth daily.    [provider]  Nutritional Supplements (IMMUNE ENHANCE) TABS Take 1 tablet by mouth daily.    [provider]  predniSONE (DELTASONE) 20 MG tablet Take 2 tablets (40 mg total) by mouth daily. 11/23/20   Blanchie Dessert, MD      Allergies    Hydrocodone-acetaminophen, Ramipril, and Hydrocodone    Review of Systems   Review of Systems  Neurological:  Positive for numbness.  All other systems reviewed and are negative.   Physical Exam Updated Vital Signs BP (!) 168/81 (BP Location: Right Arm)   Pulse (!) 48   Temp 99.2 F (37.3 C)   Resp 16   Ht '5\' 11"'$  (1.803 m)   Wt 96.2 kg   SpO2 100%   BMI 29.57 kg/m  Physical Exam Vitals and nursing note reviewed.  Constitutional:      General: He is not in acute distress.    Appearance: Normal appearance. He is normal weight. He is not ill-appearing.  HENT:     Head: Normocephalic and atraumatic.  Eyes:     Pupils: Pupils are equal, round, and reactive to light.  Cardiovascular:     Rate and Rhythm: Regular rhythm. Bradycardia present.     Pulses: Normal pulses.  Heart sounds: Normal heart sounds.  Pulmonary:     Effort: Pulmonary effort is normal. No respiratory distress.     Breath sounds: Normal breath sounds.  Abdominal:     General: Abdomen is flat.     Tenderness: There is no abdominal tenderness.  Musculoskeletal:        General: Normal range of motion.     Cervical back: Neck supple.     Right lower leg: No edema.     Left lower leg: No edema.  Skin:    General: Skin is warm and dry.     Capillary Refill: Capillary refill takes less than 2 seconds.  Neurological:     General: No focal deficit present.     Mental Status: He is alert and oriented to person, place, and time.  Psychiatric:        Mood and Affect: Mood normal.        Behavior: Behavior normal.      ED Results / Procedures / Treatments   Labs (all labs ordered are listed, but only abnormal results are displayed) Labs Reviewed - No data to display  EKG None  Radiology No results found.  Procedures Procedures    Medications Ordered in ED Medications - No data to display  ED Course/ Medical Decision Making/ A&P                           Medical Decision Making This patient presents to the ED for concern of hypertension, this involves an extensive number of treatment options, and is a complaint that carries with it a high risk of complications and morbidity.  The differential diagnosis for hypertension includes but is not limited to hypertensive emergency, hypertensive urgency, stroke, sympathomimetic ingestion, acute pulmonary edema, ischemic stroke, intracranial hemorrhage, preeclampsia or eclampsia, autonomic dysreflexia, acute glomerulonephritis, type I MI, volume overload, urinary obstruction, pain, renal artery stenosis, polycystic kidney disease, Cushing syndrome, OSA, pheochromocytoma, hyperaldosteronism, hypothyroidism, anxiety    Co morbidities that complicate the patient evaluation  Hypertension, CKD, hyperlipidemia, arthritis, bradycardia   Additional history obtained from: Nursing notes from this visit. Previous records within EMR system cardiology visits on 01/10/2022.  Last recommendation was to follow-up in 1 year and follow low-salt diet   I ordered, reviewed and interpreted labs which include: Patient denied labs   I ordered imaging studies including patient denied imaging  Cardiac Monitoring:  The patient was maintained on a cardiac monitor.  I personally viewed and interpreted the cardiac monitored which showed an underlying rhythm of: Patient denied EKG   Afebrile, minimally hypertensive.  Known bradycardia.  Patient's was confused on location and accidentally presented to the ED thinking he was checking in for an outpatient appointment.  When  told that he was in the ED he states he must have shown up to the wrong place and had the wrong address.  Patient reported symptoms of minor tingling in the hands and feet that has been present for the past month.  No recent changes in the symptoms.  Patient has no other symptoms.  When asked if he would like an emergency departments work-up, patient declined.  I believe this is appropriate since he had no intentions of coming to the ED.  Informed patient to continue taking his medications and follow-up with his primary care provider as needed.  Stable at discharge.  At this time there does not appear to be any evidence of an acute emergency medical condition  and the patient appears stable for discharge with appropriate outpatient follow up. Diagnosis was discussed with patient who verbalizes understanding of care plan and is agreeable to discharge. I have discussed return precautions with patient who verbalizes understanding. Patient encouraged to follow-up with their PCP within 1 week. All questions answered.  Note: Portions of this report may have been transcribed using voice recognition software. Every effort was made to ensure accuracy; however, inadvertent computerized transcription errors may still be present.          Final Clinical Impression(s) / ED Diagnoses Final diagnoses:  Hypertension, unspecified type    Rx / DC Orders ED Discharge Orders     None         Roylene Reason, PA-C 04/24/22 1544    Regan Lemming, MD 04/24/22 [phone]

## 2022-04-25 ENCOUNTER — Ambulatory Visit: Payer: Medicare HMO | Admitting: Psychology

## 2022-04-26 ENCOUNTER — Ambulatory Visit: Payer: Medicare HMO | Admitting: Psychology

## 2022-05-03 ENCOUNTER — Emergency Department (HOSPITAL_COMMUNITY)
Admission: EM | Admit: 2022-05-03 | Discharge: 2022-05-03 | Disposition: A | Discharge status: Home / Self Care | Payer: Medicare HMO | Attending: Emergency Medicine | Admitting: Emergency Medicine

## 2022-05-03 ENCOUNTER — Other Ambulatory Visit: Payer: Self-pay

## 2022-05-03 ENCOUNTER — Encounter (HOSPITAL_COMMUNITY): Payer: Self-pay | Admitting: *Deleted

## 2022-05-03 DIAGNOSIS — R202 Paresthesia of skin: Secondary | ICD-10-CM | POA: Diagnosis present

## 2022-05-03 DIAGNOSIS — Z7982 Long term (current) use of aspirin: Secondary | ICD-10-CM | POA: Insufficient documentation

## 2022-05-03 DIAGNOSIS — I129 Hypertensive chronic kidney disease with stage 1 through stage 4 chronic kidney disease, or unspecified chronic kidney disease: Secondary | ICD-10-CM | POA: Diagnosis not present

## 2022-05-03 DIAGNOSIS — N182 Chronic kidney disease, stage 2 (mild): Secondary | ICD-10-CM | POA: Insufficient documentation

## 2022-05-03 DIAGNOSIS — Z79899 Other long term (current) drug therapy: Secondary | ICD-10-CM | POA: Diagnosis not present

## 2022-05-03 DIAGNOSIS — I10 Essential (primary) hypertension: Secondary | ICD-10-CM

## 2022-05-03 DIAGNOSIS — Z8546 Personal history of malignant neoplasm of prostate: Secondary | ICD-10-CM | POA: Insufficient documentation

## 2022-05-03 LAB — CBC WITH DIFFERENTIAL/PLATELET
Abs Immature Granulocytes: 0 10*3/uL (ref 0.00–0.07)
Basophils Absolute: 0 10*3/uL (ref 0.0–0.1)
Basophils Relative: 1 %
Eosinophils Absolute: 0.2 10*3/uL (ref 0.0–0.5)
Eosinophils Relative: 5 %
HCT: 34.2 % — ABNORMAL LOW (ref 39.0–52.0)
Hemoglobin: 12 g/dL — ABNORMAL LOW (ref 13.0–17.0)
Immature Granulocytes: 0 %
Lymphocytes Relative: 27 %
Lymphs Abs: 1 10*3/uL (ref 0.7–4.0)
MCH: 28.2 pg (ref 26.0–34.0)
MCHC: 35.1 g/dL (ref 30.0–36.0)
MCV: 80.5 fL (ref 80.0–100.0)
Monocytes Absolute: 0.2 10*3/uL (ref 0.1–1.0)
Monocytes Relative: 7 %
Neutro Abs: 2.2 10*3/uL (ref 1.7–7.7)
Neutrophils Relative %: 60 %
Platelets: 157 10*3/uL (ref 150–400)
RBC: 4.25 MIL/uL (ref 4.22–5.81)
RDW: 14.3 % (ref 11.5–15.5)
WBC: 3.7 10*3/uL — ABNORMAL LOW (ref 4.0–10.5)
nRBC: 0 % (ref 0.0–0.2)

## 2022-05-03 LAB — COMPREHENSIVE METABOLIC PANEL
ALT: 16 U/L (ref 0–44)
AST: 22 U/L (ref 15–41)
Albumin: 3.9 g/dL (ref 3.5–5.0)
Alkaline Phosphatase: 53 U/L (ref 38–126)
Anion gap: 7 (ref 5–15)
BUN: 12 mg/dL (ref 8–23)
CO2: 23 mmol/L (ref 22–32)
Calcium: 9.1 mg/dL (ref 8.9–10.3)
Chloride: 108 mmol/L (ref 98–111)
Creatinine, Ser: 1.24 mg/dL (ref 0.61–1.24)
GFR, Estimated: >60 mL/min (ref >60)
Glucose, Bld: 135 mg/dL — ABNORMAL HIGH (ref 70–99)
Potassium: 3.6 mmol/L (ref 3.5–5.1)
Sodium: 138 mmol/L (ref 135–145)
Total Bilirubin: 1.7 mg/dL — ABNORMAL HIGH (ref 0.3–1.2)
Total Protein: 5.9 g/dL — ABNORMAL LOW (ref 6.5–8.1)

## 2022-05-03 MED ORDER — AMLODIPINE BESYLATE 5 MG PO TABS: 10.0000 mg | ORAL_TABLET | Freq: Every evening | ORAL | 0 refills | Status: DC

## 2022-05-03 NOTE — ED Provider Notes (Signed)
[facility] EMERGENCY DEPARTMENT Provider Note   CSN: [id number] Arrival date & time: 05/03/22  6269     History  Chief Complaint  Patient presents with   Hypertension    Dennis Huffman is a 71 y.o. male.   Hypertension  Patient presents with hypertension.  States he is monitoring his blood pressure at home and has been high.  States up to 190s.  No chest pain.  No trouble breathing.  States systolics are running 485I to 180s.  States occasionally feels tingling's in his fingers and toes.    Past Medical History:  Diagnosis Date   Arthritis    cervical spondylotic, radiculopathy   Bradycardia    a. HR previously reported as 40 at PCP's office; event monitor 2017 showed nocturnal bradycardia but no daytime findings.   CKD (chronic kidney disease), stage II    Hypercholesteremia    Hypertension    Premature atrial contractions    Prostate cancer (HCC)    PVC's (premature ventricular contractions)     Home Medications Prior to Admission medications   Medication Sig Start Date End Date Taking? Authorizing Provider  acetaminophen (TYLENOL) 650 MG CR tablet Take 650-1,300 mg by mouth every 8 (eight) hours as needed for pain.    [provider]  amLODipine (NORVASC) 5 MG tablet Take 2 tablets (10 mg total) by mouth every evening. 05/03/22   Davonna Belling, MD  aspirin EC 81 MG tablet Take 1 tablet (81 mg total) by mouth daily. Swallow whole. 09/26/20   Jettie Booze, MD  atorvastatin (LIPITOR) 10 MG tablet Take 10 mg by mouth daily.    [provider]  losartan (COZAAR) 100 MG tablet Take 100 mg by mouth daily.    [provider]  magnesium oxide (MAG-OX) 400 MG tablet Take 400 mg by mouth daily.    [provider]  Melatonin 10 MG CAPS Take 10-20 mg by mouth at bedtime.    [provider]  metoprolol succinate (TOPROL XL) 25 MG 24 hr tablet Take one tablet by mouth daily as needed for palpitations 01/10/22    Jettie Booze, MD  Multiple Vitamins-Minerals [facility] FOR HIM 50+) TABS Take 1 tablet by mouth daily.    [provider]  Nutritional Supplements (IMMUNE ENHANCE) TABS Take 1 tablet by mouth daily.    [provider]  predniSONE (DELTASONE) 20 MG tablet Take 2 tablets (40 mg total) by mouth daily. 11/23/20   Blanchie Dessert, MD      Allergies    Hydrocodone-acetaminophen, Ramipril, and Hydrocodone    Review of Systems   Review of Systems  Physical Exam Updated Vital Signs BP (!) 180/98 (BP Location: Right Arm)   Pulse 62   Temp 98.3 F (36.8 C)   Resp 16   Ht '5\' 11"'$  (1.803 m)   Wt 96.2 kg   SpO2 100%   BMI 29.57 kg/m  Physical Exam Vitals and nursing note reviewed.  Cardiovascular:     Rate and Rhythm: Regular rhythm. Bradycardia present.  Pulmonary:     Breath sounds: No wheezing.  Musculoskeletal:     Cervical back: Neck supple.     Right lower leg: No edema.     Left lower leg: No edema.  Skin:    General: Skin is warm.     Capillary Refill: Capillary refill takes less than 2 seconds.  Neurological:     Mental Status: He is alert.     ED Results /  Procedures / Treatments   Labs (all labs ordered are listed, but only abnormal results are displayed) Labs Reviewed  COMPREHENSIVE METABOLIC PANEL - Abnormal; Notable for the following components:      Result Value   Glucose, Bld 135 (*)    Total Protein 5.9 (*)    Total Bilirubin 1.7 (*)    All other components within normal limits  CBC WITH DIFFERENTIAL/PLATELET - Abnormal; Notable for the following components:   WBC 3.7 (*)    Hemoglobin 12.0 (*)    HCT 34.2 (*)    All other components within normal limits    EKG None  Radiology No results found.  Procedures Procedures    Medications Ordered in ED Medications - No data to display  ED Course/ Medical Decision Making/ A&P                           Medical Decision Making  Patient with hypertension.  History of same.   On Cozaar at 100 and on amlodipine at 5.  No chest pain.  No trouble breathing.  States he will sometimes feel tingling in his fingers or toes.  No headache.  No confusion.  Does have PCP and cardiologist.  Labs reassuring.  No endorgan damage.  EKG done and shows  Appears stable for discharge home.  Do not see acute endorgan damage but will increase his amlodipine up to 10 mg.  Follow-up with PCP.Visit plasty with mild to moderate this is less than 50% up to pulmonary edema and then 3 is like          Final Clinical Impression(s) / ED Diagnoses Final diagnoses:  Hypertension, unspecified type    Rx / DC Orders ED Discharge Orders          Ordered    amLODipine (NORVASC) 5 MG tablet  Every evening        05/03/22 1212              Davonna Belling, MD 05/03/22 1215

## 2022-05-03 NOTE — ED Triage Notes (Signed)
Patient states he has been taking his blood pressures at home and at times they are elevated  denies headache or blurred vision, states he just wants his blood pressure checked.

## 2022-05-03 NOTE — Discharge Instructions (Signed)
Take 2 tablets of the lisinopril a day instead of 1.  Follow-up with your doctor for recheck.

## 2022-05-03 NOTE — ED Provider Triage Note (Signed)
Emergency Medicine Provider Triage Evaluation Note  Dennis Huffman , a 71 y.o. male  was evaluated in triage.  Pt complains of high blood pressure reading.  Patient states that over the past 5 to 6 days he has had elevated blood pressure readings.  He is currently complaining of no symptoms.  Denies dizziness, lightheadedness, changes in vision, slurred speech, facial droop, weakness/sensory deficits, chest pain, shortness of breath, abdominal pain, urinary symptoms.  Patient states he has had no changes to his medication.  He states he has been eating fast food sandwiches multiple times daily and has since stopped approximately 6 to 7 days ago but is concerned about continued elevated blood pressures.  States that in the 938H to 829H systolic.[name]  Review of Systems  Positive: See above Negative:   Physical Exam  BP (!) 180/98 (BP Location: Right Arm)   Pulse 62   Temp 98.3 F (36.8 C)   Resp 16   Ht '5\' 11"'$  (1.803 m)   Wt 96.2 kg   SpO2 100%   BMI 29.57 kg/m  Gen:   Awake, no distress   Resp:  Normal effort  MSK:   Moves extremities without difficulty  Other:    Medical Decision Making  Medically screening exam initiated at 9:28 AM.  Appropriate orders placed.  Dennis Huffman was informed that the remainder of the evaluation will be completed by another provider, this initial triage assessment does not replace that evaluation, and the importance of remaining in the ED until their evaluation is complete.     Wilnette Kales, [place] 05/03/22 [phone]

## 2022-05-07 ENCOUNTER — Emergency Department (HOSPITAL_COMMUNITY)
Admission: EM | Admit: 2022-05-07 | Discharge: 2022-05-07 | Disposition: A | Discharge status: Home / Self Care | Payer: Medicare HMO | Attending: Emergency Medicine | Admitting: Emergency Medicine

## 2022-05-07 ENCOUNTER — Other Ambulatory Visit: Payer: Self-pay

## 2022-05-07 DIAGNOSIS — Z0130 Encounter for examination of blood pressure without abnormal findings: Secondary | ICD-10-CM | POA: Diagnosis not present

## 2022-05-07 DIAGNOSIS — I10 Essential (primary) hypertension: Secondary | ICD-10-CM | POA: Diagnosis not present

## 2022-05-07 DIAGNOSIS — Z7982 Long term (current) use of aspirin: Secondary | ICD-10-CM | POA: Insufficient documentation

## 2022-05-07 DIAGNOSIS — Z79899 Other long term (current) drug therapy: Secondary | ICD-10-CM | POA: Insufficient documentation

## 2022-05-07 NOTE — ED Triage Notes (Signed)
Pt reports hx of hypertension and a blood pressure reading of 937 systolic today and is concerned. Compliant with meds. Normotensive in triage.

## 2022-05-07 NOTE — Discharge Instructions (Signed)
Record your blood pressure readings and take to a follow up appointment with your doctor.

## 2022-05-07 NOTE — ED Notes (Signed)
Pt reports leaving a bag of his medications in the previous triage room. Triage checked for medications, family member made aware no bags of meds were located.

## 2022-05-07 NOTE — ED Provider Notes (Signed)
[facility] EMERGENCY [facility]MENT Provider Note   CSN: [id number] Arrival date & time: 05/07/22  1050     History  No chief complaint on file.   Dennis Huffman is a 71 y.o. male.  71 year old male presents with concern for elevated blood pressure.  Patient does not have a history of hypertension, states that he is checked his blood pressure at home today and it was 570 systolic.  He denies any chest pain, shortness of breath, headache, visual disturbance, unilateral weakness or numbness, changes in speech.  No other complaints or concerns today.       Home Medications Prior to Admission medications   Medication Sig Start Date End Date Taking? Authorizing Provider  acetaminophen (TYLENOL) 650 MG CR tablet Take 650-1,300 mg by mouth every 8 (eight) hours as needed for pain.    [provider]  amLODipine (NORVASC) 5 MG tablet Take 2 tablets (10 mg total) by mouth every evening. 05/03/22   Davonna Belling, MD  aspirin EC 81 MG tablet Take 1 tablet (81 mg total) by mouth daily. Swallow whole. 09/26/20   Jettie Booze, MD  atorvastatin (LIPITOR) 10 MG tablet Take 10 mg by mouth daily.    [provider]  losartan (COZAAR) 100 MG tablet Take 100 mg by mouth daily.    [provider]  magnesium oxide (MAG-OX) 400 MG tablet Take 400 mg by mouth daily.    [provider]  Melatonin 10 MG CAPS Take 10-20 mg by mouth at bedtime.    [provider]  metoprolol succinate (TOPROL XL) 25 MG 24 hr tablet Take one tablet by mouth daily as needed for palpitations 01/10/22   Jettie Booze, MD  Multiple Vitamins-Minerals [facility] FOR HIM 50+) TABS Take 1 tablet by mouth daily.    [provider]  Nutritional Supplements (IMMUNE ENHANCE) TABS Take 1 tablet by mouth daily.    [provider]  predniSONE (DELTASONE) 20 MG tablet Take 2 tablets (40 mg total) by mouth daily. 11/23/20   Blanchie Dessert, MD       Allergies    Hydrocodone-acetaminophen, Ramipril, and Hydrocodone    Review of Systems   Review of Systems Negative except as per HPI Physical Exam Updated Vital Signs BP 118/87 (BP Location: Right Arm)   Pulse (!) 56   Temp 98.6 F (37 C) (Oral)   Resp 16   SpO2 100%  Physical Exam Vitals and nursing note reviewed.  Constitutional:      General: He is not in acute distress.    Appearance: He is well-developed. He is not diaphoretic.  HENT:     Head: Normocephalic and atraumatic.  Cardiovascular:     Rate and Rhythm: Normal rate and regular rhythm.     Heart sounds: Normal heart sounds.  Pulmonary:     Effort: Pulmonary effort is normal.     Breath sounds: Normal breath sounds.  Abdominal:     Palpations: Abdomen is soft.     Tenderness: There is no abdominal tenderness.  Skin:    General: Skin is warm and dry.     Findings: No erythema or rash.  Neurological:     Mental Status: He is alert and oriented to person, place, and time.     Gait: Gait normal.  Psychiatric:        Behavior: Behavior normal.     ED Results / Procedures / Treatments   Labs (all labs ordered are listed, but only  abnormal results are displayed) Labs Reviewed - No data to display  EKG None  Radiology No results found.  Procedures Procedures    Medications Ordered in ED Medications - No data to display  ED Course/ Medical Decision Making/ A&P                           Medical Decision Making  71 year old male presents with concern for elevated blood pressure without history of hypertension.  Review of records, does have a history of hypertension.  Is prescribed metoprolol to take as needed for palpitations as well as prescriptions for Cozaar and Norvasc.  Patient's blood pressure is normal today.  He is provided with a blood pressure log to record his readings on and encouraged to follow-up with his primary care provider for further management.        Final Clinical  Impression(s) / ED Diagnoses Final diagnoses:  Blood pressure check    Rx / DC Orders ED Discharge Orders     None         Tacy Learn, PA-C 05/07/22 1141    Audley Hose, MD 05/07/22 [phone]

## 2022-05-07 NOTE — ED Provider Triage Note (Signed)
Emergency Medicine Provider Triage Evaluation Note  ATIF CHAPPLE , a 71 y.o. male  was evaluated in triage.  Pt complains of elevated BP 096K systolic this AM. No hx of HTN, denies headache, vision changes, leg swelling, SHOB, numbness or weakness.  Review of Systems  Positive: As above Negative: As above   Physical Exam  There were no vitals taken for this visit. Gen:   Awake, no distress   Resp:  Normal effort  MSK:   Moves extremities without difficulty  Other:    Medical Decision Making  Medically screening exam initiated at 11:26 AM.  Appropriate orders placed.  NASIF BOS was informed that the remainder of the evaluation will be completed by another provider, this initial triage assessment does not replace that evaluation, and the importance of remaining in the ED until their evaluation is complete.  122/75.   Tacy Learn, PA-C 05/07/22 1127

## 2022-05-08 ENCOUNTER — Encounter: Payer: Medicare HMO | Attending: Psychology

## 2022-05-08 ENCOUNTER — Telehealth: Payer: Self-pay

## 2022-05-08 DIAGNOSIS — F03A0 Unspecified dementia, mild, without behavioral disturbance, psychotic disturbance, mood disturbance, and anxiety: Secondary | ICD-10-CM | POA: Insufficient documentation

## 2022-05-08 NOTE — Progress Notes (Signed)
   Behavioral Observations The patient appeared well-groomed and appropriately dressed. His manners were polite and appropriate to the situation. The patient was apathetic toward testing and his effort was poor (ex: no response to questions or responded with "I couldn't remember that even if I tried").    Neuropsychology Note  Benard Rink completed 30 minutes of neuropsychological testing with technician, Dina Rich, BA, under the supervision of Ilean Skill, PsyD., Clinical Neuropsychologist. The patient did not appear overtly distressed by the testing session, per behavioral observation or via self-report to the technician. Rest breaks were offered.   Clinical Decision Making: In considering the patient's current level of functioning, level of presumed impairment, nature of symptoms, emotional and behavioral responses during clinical interview, level of literacy, and observed level of motivation/effort, a battery of tests was selected by Dr. Sima Matas during initial consultation on 04/20/2022. This was communicated to the technician. Communication between the neuropsychologist and technician was ongoing throughout the testing session and changes were made as deemed necessary based on patient performance on testing, technician observations and additional pertinent factors such as those listed above.  Tests Administered: Repeatable Battery for the Assessment of Neuropsychological Status (RBANS); Form A  Results: Will be included in final report   Feedback to Patient: NATHANEAL SOMMERS will return on 11/05/2022 for an interactive feedback session with Dr. Sima Matas at which time his test performances, clinical impressions and treatment recommendations will be reviewed in detail. The patient understands he can contact our office should he require our assistance before this time.  30 minutes spent face-to-face with patient administering standardized tests, 30 minutes spent scoring  Environmental education officer). [CPT [id number], 94801]  Full report to follow.

## 2022-05-08 NOTE — Telephone Encounter (Signed)
        Patient  visited [place] on 10/31     Telephone encounter attempt :  1st  A HIPAA compliant voice message was left requesting a return call.  Instructed patient to call back    [place] [place] [facility]  [phone] [address], [place], [place] [address] Phone: [phone] Email: [email or web address]

## 2022-05-24 ENCOUNTER — Ambulatory Visit (HOSPITAL_COMMUNITY)
Admission: EM | Admit: 2022-05-24 | Discharge: 2022-05-24 | Disposition: A | Discharge status: Home / Self Care | Payer: Medicare HMO

## 2022-05-24 ENCOUNTER — Encounter (HOSPITAL_COMMUNITY): Payer: Self-pay | Admitting: Emergency Medicine

## 2022-05-24 DIAGNOSIS — I10 Essential (primary) hypertension: Secondary | ICD-10-CM | POA: Diagnosis not present

## 2022-05-24 DIAGNOSIS — Z0130 Encounter for examination of blood pressure without abnormal findings: Secondary | ICD-10-CM

## 2022-05-24 NOTE — ED Triage Notes (Signed)
Pt presents to UC for a "blood pressure check". States he has been keeping a log of his BP and noticed his BP has been elevated. States he normally takes Losartan and has been compliant with his meds. Denies headaches and vision changes.

## 2022-05-24 NOTE — Discharge Instructions (Addendum)
Continue taking all of your blood pressure medications as prescribed. The goal is to lower your blood pressure gradually, not immediately.  Continue to lower the amount of salt in your diet and increase your exercise to at least 30 minutes 5 times weekly.  Refer to the DASH diet eating plan in your discharge paperwork for guidance regarding dietary changes to lower your blood pressure.  Schedule an appointment with your primary care provider for further evaluation and management of your high blood pressure.  If you develop any new or worsening symptoms or do not improve in the next 2 to 3 days, please return.  If your symptoms are severe, please go to the emergency room.  Follow-up with your primary care provider for further evaluation and management of your symptoms as well as ongoing wellness visits.  I hope you feel better!

## 2022-05-24 NOTE — ED Provider Notes (Signed)
[place]    CSN: [id number] Arrival date & time: 05/24/22  1058      History   Chief Complaint Chief Complaint  Patient presents with   Blood Pressure Check    HPI Dennis Huffman is a 71 y.o. male.   Patient presents urgent care for blood pressure check.  Patient has a history of hypertension and currently takes losartan '100mg'$  daily, metoprolol xl '25mg'$ , and amlodipine '10mg'$  at bedtime daily. He has been taking all of his medications as prescribed.  He is not experiencing any dizziness, shortness of breath, fatigue, chest pain, nausea, vomiting, abdominal pain, neck discomfort, blurry vision, headache, or lightheadedness. Patient has been keeping a log of all of his blood pressures and has even been taking blood pressure multiple times throughout the day. He has significantly cut back his sodium intake over the last few months and does not eat very much pork or red meat.  Patient has been taking losartan blood pressure medication for "years" and does not know when he started the metoprolol and amlodipine tablets. He believes he has been taking amlodipine for "a long time" as well, but is unsure. He is concerned because he has seen some of his blood pressure readings as high as 190/90 and as low as 120s/70s. Was seen in the ED earlier this month where his amlodipine was increased to '10mg'$  instead of '5mg'$  once daily. He would like his blood pressure to be normal all the time and does not like that it is elevated despite taking so many blood pressure medications.      Past Medical History:  Diagnosis Date   Arthritis    cervical spondylotic, radiculopathy   Bradycardia    a. HR previously reported as 40 at PCP's office; event monitor 2017 showed nocturnal bradycardia but no daytime findings.   CKD (chronic kidney disease), stage II    Hypercholesteremia    Hypertension    Premature atrial contractions    Prostate cancer ([place])    PVC's (premature ventricular contractions)      Patient Active Problem List   Diagnosis Date Noted   Essential hypertension 05/24/2022   Degenerative joint disease of left hip 11/28/2018   Prostate cancer ([place]) 08/29/2016   Neck pain 05/12/2015   Bradycardia 04/21/2014   Hyperlipidemia 04/21/2014    Past Surgical History:  Procedure Laterality Date   ANTERIOR CERVICAL DECOMP/DISCECTOMY FUSION N/A 05/12/2015   Procedure: ANTERIOR CERVICAL DECOMPRESSION/DISCECTOMY FUSION C3 - C6 3 LEVELS;  Surgeon: Melina Schools, MD;  Location: [facility];  Service: Orthopedics;  Laterality: N/A;   colonscopy     COLONSCOPY      INSERTION PROSTATE RADIATION SEED  UNSURE OF DATE   DONE BY DR Karsten Ro    PROSTATE BIOPSY     ROTATOR CUFF REPAIR Right 03-13-12; 09-27-15   TOTAL HIP ARTHROPLASTY Left 11/28/2018   Procedure: TOTAL HIP ARTHROPLASTY;  Surgeon: Earlie Server, MD;  Location: [facility];  Service: Orthopedics;  Laterality: Left;       Home Medications    Prior to Admission medications   Medication Sig Start Date End Date Taking? Authorizing Provider  acetaminophen (TYLENOL) 650 MG CR tablet Take 650-1,300 mg by mouth every 8 (eight) hours as needed for pain.    [provider]  amLODipine (NORVASC) 5 MG tablet Take 2 tablets (10 mg total) by mouth every evening. 05/03/22   Davonna Belling, MD  aspirin EC 81 MG tablet Take 1 tablet (81 mg total) by mouth daily.  Swallow whole. 09/26/20   Jettie Booze, MD  atorvastatin (LIPITOR) 10 MG tablet Take 10 mg by mouth daily.    [provider]  losartan (COZAAR) 100 MG tablet Take 100 mg by mouth daily.    [provider]  magnesium oxide (MAG-OX) 400 MG tablet Take 400 mg by mouth daily.    [provider]  Melatonin 10 MG CAPS Take 10-20 mg by mouth at bedtime.    [provider]  metoprolol succinate (TOPROL XL) 25 MG 24 hr tablet Take one tablet by mouth daily as needed for palpitations 01/10/22   Jettie Booze, MD  Multiple Vitamins-Minerals  [facility] FOR HIM 50+) TABS Take 1 tablet by mouth daily.    [provider]  Nutritional Supplements (IMMUNE ENHANCE) TABS Take 1 tablet by mouth daily.    [provider]  predniSONE (DELTASONE) 20 MG tablet Take 2 tablets (40 mg total) by mouth daily. 11/23/20   Blanchie Dessert, MD    Family History Family History  Problem Relation Age of Onset   Healthy Mother    Healthy Father    Stroke Sister    Cancer Sister        breast    Social History Social History   Tobacco Use   Smoking status: Former    Types: Cigarettes    Quit date: 06/26/1971    Years since quitting: 50.9   Smokeless tobacco: Never  Vaping Use   Vaping Use: Never used  Substance Use Topics   Alcohol use: Yes    Comment: socially    Drug use: No     Allergies   Hydrocodone-acetaminophen, Ramipril, and Hydrocodone   Review of Systems Review of Systems Per HPI  Physical Exam Triage Vital Signs ED Triage Vitals [05/24/22 1239]  Enc Vitals Group     BP (!) 155/90     Pulse Rate (!) 50     Resp 17     Temp 98.2 F (36.8 C)     Temp Source Oral     SpO2 98 %     Weight      Height      Head Circumference      Peak Flow      Pain Score 0     Pain Loc      Pain Edu?      Excl. in [place]?    No data found.  Updated Vital Signs BP (!) 155/90 (BP Location: Left Arm)   Pulse (!) 50   Temp 98.2 F (36.8 C) (Oral)   Resp 17   SpO2 98%   Visual Acuity Right Eye Distance:   Left Eye Distance:   Bilateral Distance:    Right Eye Near:   Left Eye Near:    Bilateral Near:     Physical Exam Vitals and nursing note reviewed.  Constitutional:      Appearance: He is not ill-appearing or toxic-appearing.  HENT:     Head: Normocephalic and atraumatic.     Right Ear: Hearing and external ear normal.     Left Ear: Hearing and external ear normal.     Nose: Nose normal.     Mouth/Throat:     Lips: Pink.  Eyes:     General: Lids are normal. Vision grossly intact. Gaze aligned  appropriately.     Extraocular Movements: Extraocular movements intact.     Conjunctiva/sclera: Conjunctivae normal.  Cardiovascular:     Rate and Rhythm: Normal rate  and regular rhythm.     Heart sounds: Normal heart sounds, S1 normal and S2 normal.  Pulmonary:     Effort: Pulmonary effort is normal. No respiratory distress.     Breath sounds: Normal breath sounds and air entry.  Musculoskeletal:     Cervical back: Neck supple.  Skin:    General: Skin is warm and dry.     Capillary Refill: Capillary refill takes less than 2 seconds.     Findings: No rash.  Neurological:     General: No focal deficit present.     Mental Status: He is alert and oriented to person, place, and time. Mental status is at baseline.     Cranial Nerves: No dysarthria or facial asymmetry.  Psychiatric:        Mood and Affect: Mood normal.        Speech: Speech normal.        Behavior: Behavior normal.        Thought Content: Thought content normal.        Judgment: Judgment normal.      UC Treatments / Results  Labs (all labs ordered are listed, but only abnormal results are displayed) Labs Reviewed - No data to display  EKG   Radiology No results found.  Procedures Procedures (including critical care time)  Medications Ordered in UC Medications - No data to display  Initial Impression / Assessment and Plan / UC Course  I have reviewed the triage vital signs and the nursing notes.  Pertinent labs & imaging results that were available during my care of the patient were reviewed by me and considered in my medical decision making (see chart for details).   1.  Essential hypertension, blood pressure check Patient's BP slightly elevated today at 155/90. He is asymptomatic. Blood pressure log from the past month shows mostly blood pressures in the 140s/80s range with some in the 160s/90s and some at 120s/80s. These are stable findings and it is unclear how long it has been since regimen has been  changed as patient is unsure and has had multiple providers caring for his hypertension medication regimen. There is no indication to change or titrate medications today based on findings from patient's blood pressure log provided.  He is agreeable with this plan.   Discussed physical exam and available lab work findings in clinic with patient.  Counseled patient regarding appropriate use of medications and potential side effects for all medications recommended or prescribed today. Discussed red flag signs and symptoms of worsening condition,when to call the PCP office, return to urgent care, and when to seek higher level of care in the emergency department. Patient verbalizes understanding and agreement with plan. All questions answered. Patient discharged in stable condition.    Final Clinical Impressions(s) / UC Diagnoses   Final diagnoses:  Essential hypertension  Blood pressure check     Discharge Instructions      Continue taking all of your blood pressure medications as prescribed. The goal is to lower your blood pressure gradually, not immediately.  Continue to lower the amount of salt in your diet and increase your exercise to at least 30 minutes 5 times weekly.  Refer to the DASH diet eating plan in your discharge paperwork for guidance regarding dietary changes to lower your blood pressure.  Schedule an appointment with your primary care provider for further evaluation and management of your high blood pressure.  If you develop any new or worsening symptoms or do not improve  in the next 2 to 3 days, please return.  If your symptoms are severe, please go to the emergency room.  Follow-up with your primary care provider for further evaluation and management of your symptoms as well as ongoing wellness visits.  I hope you feel better!     ED Prescriptions   None    PDMP not reviewed this encounter.   Talbot Grumbling, [place] 05/24/22 1435

## 2022-05-31 IMAGING — DX DG HUMERUS 2V *R*
2 series · 2 of 2 positions shown · non-contrast
Comparison: None.

CLINICAL DATA: Knot on the right arm.

EXAM:
RIGHT HUMERUS - 2+ VIEW

[dg humerus right (1 of 2)]
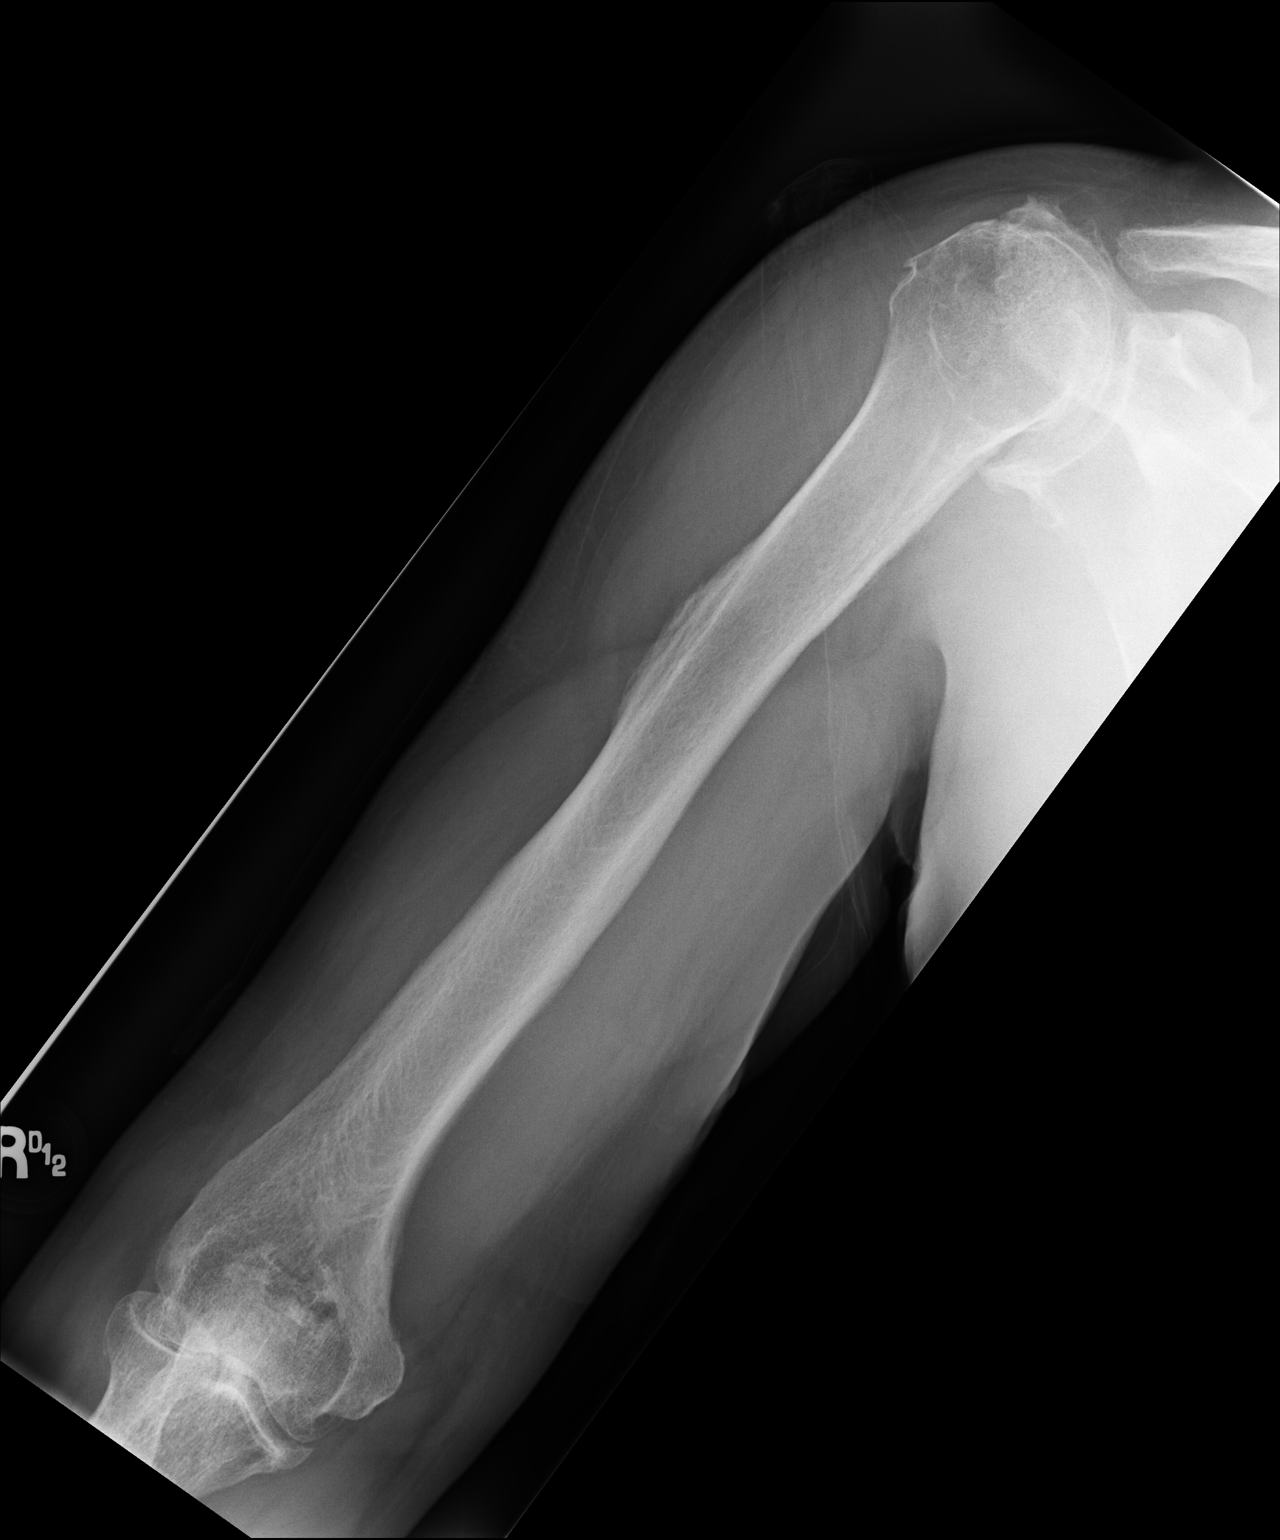

[dg humerus right (2 of 2)]
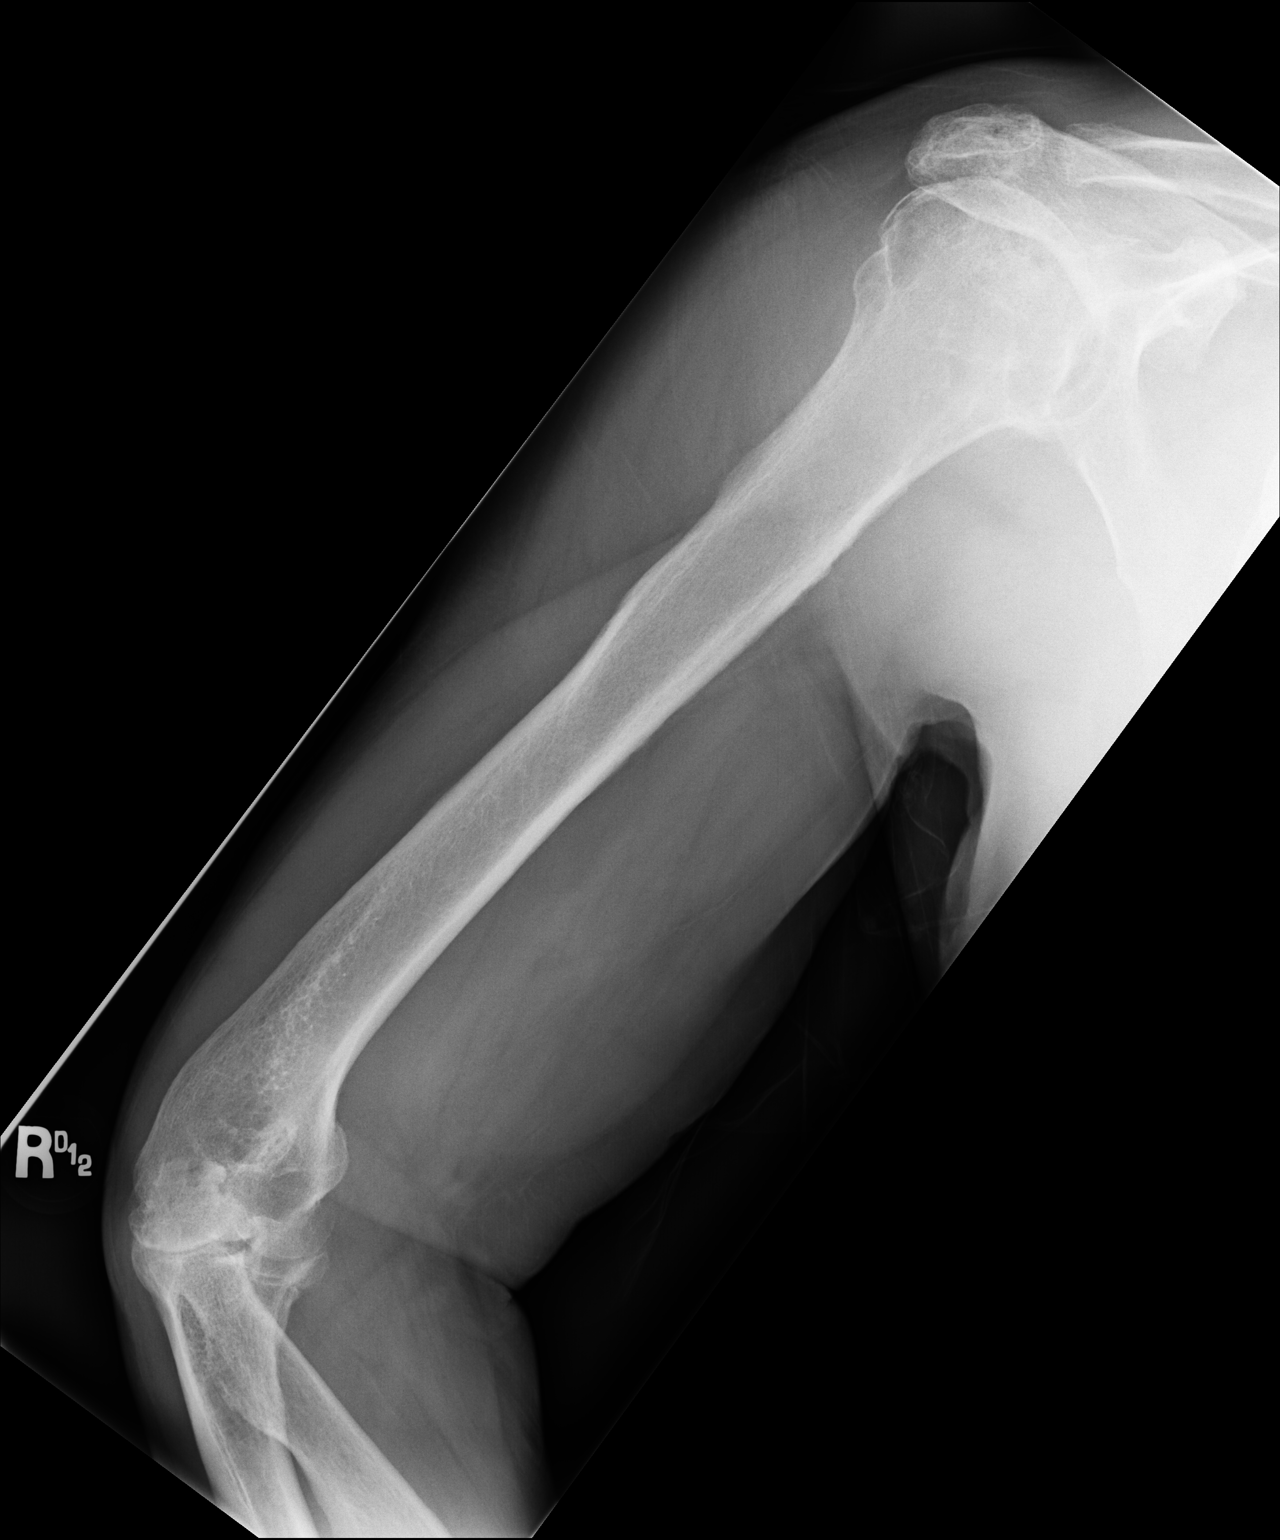

[2 of 2 positions shown; findings below may reference images not displayed]

FINDINGS: There is no evidence of an acute fracture or dislocation. Mild,
stable, chronic changes are noted along the cortex of the lateral
aspect of the proximal right humeral shaft. Chronic and degenerative
changes are seen involving the visualized portion of the right
shoulder. Soft tissues are unremarkable.
IMPRESSION: 1. No acute fracture or dislocation.
2. Chronic and degenerative changes, as described above.

## 2022-06-01 ENCOUNTER — Other Ambulatory Visit: Payer: Self-pay | Admitting: Interventional Cardiology

## 2022-06-03 NOTE — Progress Notes (Signed)
Neuropsychological Consultation   Patient:   Dennis Huffman   DOB:   Aug 16, 1950  MR Number:  [id number]  Location:  [facility] [place], [place] [id number] [place] [address] Dept: [phone]           Date of Service:   04/20/2022  Start Time:   3 PM End Time:   5 PM  Provider/Observer:  Ilean Skill, Psy.D.       Clinical Neuropsychologist       Billing Code/Service: 96116/96121  Reason for Service:    Dennis Huffman is a 71 year old male initially referred for neuropsychological evaluation by Debbora Presto, NP with [facility].  The patient has been describing difficulties with memory starting back in 2020 with a gradual onset over time.  Patient has been followed by Dr. Felecia Shelling for neurological care accompanied by Debbora Presto, NP.  There were no indications of depression but there was noted some mild apathy.  No changes in geographic orientation.  Neuropsychological evaluation had a clinical impression of mild major neurocognitive disorder due to multiple etiologies without behavioral disturbance.  I have included the results of that neuropsychological evaluation below for convenience.  Interpreted report suggest relative impairment consistent with mild frontal subcortical dysfunction consistent with a major neurocognitive disorder due to multiple etiologies most prominently vascular in nature.  The results were not particularly consistent with a progressive neurodegenerative dementia.  Today's visit was a follow-up clinical interview and scheduling for follow-up repeat testing for direct comparisons to 1 year prior.  During the clinical interview today, the patient reports that he has not identified any particular changes or worsening in memory but he is still having difficulties with short-term memory.  Patient reports that he continues to have tinnitus as  well.  Patient reports that he continues to have difficulty remembering like he used to but it is relatively the same.  Patient denies any changes in geographic orientation or visual spatial changes.  The patient also any changes in fine motor control or motor speed and denies any changes in gait.  Patient denies any visual hallucinations, tremors and also denies any changes in expressive language functions.  Patient reports that he is using calendars to keep up with important events but is paying his bills okay and doing fine managing IADLs.  Below are the results from neuropsychological evaluation conducted by Joelyn Oms, PsyD on 11/22/2020 with recommendations of repeat testing in 1 year.  Test Scores:        TEST SCORES:  Note: This summary of test scores accompanies the interpretive report and should not be considered in isolation without reference to the appropriate sections in the text. Descriptors are based on appropriate normative data and may be adjusted based on clinical judgment. The terms "impaired" and "within normal limits (WNL)" are used when a more specific level of functioning cannot be determined.     Validity Testing:     Descriptor           RBANS Effort Index: --- --- Within Expectation           Cognitive Screening:                 RBANS, Form A: Standard Score/ Scaled Score Percentile    Total Score 72 3 Well Below Average  Immediate Memory 65 1 Exceptionally Low  List Learning 4 2 Well Below Average  Story Memory 4  2 Well Below Average  Visuospatial/Constructional 100 50 Average  Figure Copy 14 91 Above Average  Line Orientation 12/20 3-9 Well Below Average  Language 82 12 Below Average  Picture Naming 10/10 51-75 Average  Semantic Fluency 3 1 Exceptionally Low  Attention 91 27 Average  Digit Span 10 50 Average  Coding 7 16 Below Average  Delayed Memory 52 <1 Exceptionally Low  List Recall 0/10 <2 Exceptionally Low  List Recognition 14/20 <2 Exceptionally  Low  Story Recall 1 <1 Exceptionally Low  Figure Recall 6 9 Below Average           Adaptive Functioning                    Raw Score Cumulative Percentile    [place] Functional Living Scale (TFLS)        Time 7 17-25 Average  Memory & Calculation 7 26-50 Average  Communication  16 ?2 Exceptionally Low   Memory 5 26-50 Average  Total 32 (T=32) 4 Well Below Average             Description of Test Results:   VALIDITY: The validity of neuropsychological testing is limited by the extent to which the individual being tested may be assumed to have exerted adequate effort during testing. Behaviorally, Dennis Huffman appeared motivated and he persisted well. He did not bring glasses and did  have problems seeing some test items, especially small print.  An embedded validity indicator (RBANS Effort Index) was within normal limits. Overall, the current results are likely a valid estimate of his capabilities   PREMORBID INTELLECTUAL ABILITY: Estimated to have been within the below average to average range based on education and occupational history.    OVERALL COGNITIVE STATUS: Dennis Huffman was given a neuropsychological screening battery (RBANS), which contains 12 subtests covering five neuropsychological domains. His total scale score on that battery, a composite of performance across tests and estimate of global cognitive functioning, was 72, which was well below average for his age (3rd percentile). Performance on individual tests and domain scores are provided below.    ATTENTION/PROCESSING SPEED: Dennis Huffman RBANS Attention Index score of 91 was average for his age (27th percentile). His performance on a test of auditory attention (RBANS Digit Span) was average (50th percentile) and he repeated up to 6 digits in order. A test of processing speed with visual scanning (RBANS Coding) was below average (16th percentile).    LANGUAGE: The tone, prosody, and fluency of Dennis Huffman speech were normal, with no  clear errors or difficulty in speech and receptive language was intact. His RBANS Language Index score of 82 was below average for his age (12th percentile). Confrontation naming (RBANS Picture Naming) was error-free, which is at least average (51-75th percentile). Category fluency (RBANS Semantic Fluency) was exceptionally low (1st percentile).    VISUOSPATIAL ABILITIES: Visuospatial ability was a relative strength. Dennis Huffman RBANS Visuospatial Index score of 100 was average for his age (50th percentile). Copy of a geometric figure (RBANS Figure Copy) was above average (91st percentile) and visuoperception (RBANS Line Orientation) was well below average (3-9th percentile); likely impacted by not wearing corrective lenses.    MEMORY: Dennis Huffman RBANS Immediate Memory Index score of 65 was exceptionally low for his age (1st percentile). Immediate recall for a list of 10 words read to him 4 times (RBANS List Learning) was well below average (2nd percentile). Immediate recall for a story read 2 times (RBANS Story Memory) was also well  below average (2nd percentile).   Dennis Huffman RBANS Delayed Memory Index score of 52 was exceptionally low for his age (<1st percentile). Spontaneous recall for the list of words read to him after delay (RBANS List Recall) was exceptionally low (0 words, <2nd percentile) and yes/no recognition for the words  (RBANS List Recognition) was also impaired although somewhat improved (14/20, <2nd percentile). Delayed recall for the story [facility] Story Recall) was exceptionally low (<2nd percentile). Spontaneous recall for the copied figure (RBANS Figure Recall) scored  below average (9th percentile).    On a self-report measure of mood, the patient's responses were not indicative of clinically significant depression at the present time. On a self-report measure of anxiety, the patient did not endorse clinically significant generalized anxiety at the present time.    ADAPTIVE  FUNCTIONING: Dennis Huffman was administered the [place] Functional Living Scale (TFLS) to assess various instrumental activities of daily living. His ability to read and use clocks and calenders was average. His ability to perform calculations using time and money was also average. His ability to conduct basic daily activities such as paying bills and using a phone book was exceptionally low and likely impacted by visual difficulties; target information is presented in small print for these tasks. He was able to correctly identify appropriate contact in case of an emergency and steps for making a simple meal (i.e., PB&J). He had trouble following directions on a food label to pretend to set a microwave to cook food correctly. His ability to recall previously presented information was average.    Clinical Impressions: Mild major neurocognitive disorder due to multiple etiologies, without behavioral disturbance (HCC)    Results of the current cognitive evaluation are mixed, with some areas of function consistent with estimated premorbid intellectual abilities. However, there are a few areas of relative impairment suggestive of mild fronto-subcortical dysfunction (e.g., variable encoding abilities with fair consolidation; impaired verbal fluency, intact adaptive function). His cognitive profile appears most consistent with a major neurocognitive disorder due to multiple etiologies (most prominently vascular), but not with a progressive neurodegenerative dementia. Potential etiologies may include a combination of the sequelae stemming from the effects of cerebrovascular disease and multiple medical conditions (e.g. chronic kidney disease, hypertension, diabetes, hyperlipidemia, bradycardia, etc.). Recent neuroimaging did  demonstrate atherosclerotic calcification of the major vessels at the base of the brain. Fortunately, there is no evidence of depression or anxiety.    Recommendations: Based on the findings of the  present evaluation, the following recommendations are offered:   Optimal control of vascular risk factors is necessary to reduce the risk of further cognitive decline.    Neuropsychological re-assessment in one year (or sooner if there is a change in cognition or behavior) is recommended in order to monitor cognitive status, track any progression of symptoms and further assist with treatment planning.  Behavioral Observation: Dennis Huffman  presents as a 71 y.o.-year-old Right handed African American Male who appeared his stated age. his dress was Appropriate and he was Well Groomed and his manners were Appropriate to the situation.  his participation was indicative of Appropriate behaviors.  There were not physical disabilities noted.  he displayed an appropriate level of cooperation and motivation.    Interactions:    Active Appropriate  Attention:   abnormal and attention span appeared shorter than expected for age  Memory:   abnormal; remote memory intact, recent memory impaired  Visuo-spatial:  not examined  Speech (Volume):  normal  Speech:   normal; normal  Thought Process:  Coherent and Relevant  Though Content:  WNL; not suicidal and not homicidal  Orientation:   person, place, time/date, and situation  Judgment:   Good  Planning:   Good  Affect:    Appropriate  Mood:    Dysphoric  Insight:   Good  Intelligence:   high  Medical History:   Past Medical History:  Diagnosis Date   Arthritis    cervical spondylotic, radiculopathy   Bradycardia    a. HR previously reported as 40 at PCP's office; event monitor 2017 showed nocturnal bradycardia but no daytime findings.   CKD (chronic kidney disease), stage II    Hypercholesteremia    Hypertension    Premature atrial contractions    Prostate cancer ([facility])    PVC's (premature ventricular contractions)          Patient Active Problem List   Diagnosis Date Noted   Essential hypertension 05/24/2022    Degenerative joint disease of left hip 11/28/2018   Prostate cancer ([place]) 08/29/2016   Neck pain 05/12/2015   Bradycardia 04/21/2014   Hyperlipidemia 04/21/2014        Family Med/Psych History:  Family History  Problem Relation Age of Onset   Healthy Mother    Healthy Father    Stroke Sister    Cancer Sister        breast    Impression/DX:  Dennis Huffman is a 71 year old male initially referred for neuropsychological evaluation by Debbora Presto, NP with [facility].  The patient has been describing difficulties with memory starting back in 2020 with a gradual onset over time.  Patient has been followed by Dr. Felecia Shelling for neurological care accompanied by Debbora Presto, NP.  There were no indications of depression but there was noted some mild apathy.  No changes in geographic orientation.  Neuropsychological evaluation had a clinical impression of mild major neurocognitive disorder due to multiple etiologies without behavioral disturbance.  I have included the results of that neuropsychological evaluation below for convenience.  Interpreted report suggest relative impairment consistent with mild frontal subcortical dysfunction consistent with a major neurocognitive disorder due to multiple etiologies most prominently vascular in nature.  The results were not particularly consistent with a progressive neurodegenerative dementia.  Today's visit was a follow-up clinical interview and scheduling for follow-up repeat testing for direct comparisons to 1 year prior.  Disposition/Plan:  We will repeat previous assessment for comparative changes to further delineate for any progressive changes.  Diagnosis:    Mild major neurocognitive disorder due to multiple etiologies without behavioral disturbance ([place])         Electronically Signed   _______________________ Ilean Skill, Psy.D. Clinical Neuropsychologist

## 2022-06-26 ENCOUNTER — Telehealth: Payer: Self-pay | Admitting: Interventional Cardiology

## 2022-06-26 DIAGNOSIS — I10 Essential (primary) hypertension: Secondary | ICD-10-CM

## 2022-06-26 NOTE — Telephone Encounter (Signed)
Pt c/o BP issue: STAT if pt c/o blurred vision, one-sided weakness or slurred speech  1. What are your last 5 BP readings?   Caller stated patient has a log of his BP readings  12/29 170/94  HR 52 (later in day) 12/29 - 156/95  HR 61 (earlier in the day) 12/28 - 142/84  HR 61  2. Are you having any other symptoms (ex. Dizziness, headache, blurred vision, passed out)?  No  3. What is your BP issue?   Caller stated patient said his medication is not helping his BP and may need to be changed.  Patient told called he felt fine but his BP readings were continuously high.  Caller stated patient can be called back directly.

## 2022-06-26 NOTE — Telephone Encounter (Signed)
Dennis Huffman is calling to update that she called the patient and he is now reporting ringing in the ear that has been going on for several weeks. She states he did not previously report this when she spoke with him prior. Patient also advised he checked BP today and systolic was in 091'Z, he did not have his book at time of call, but does have exact reading recorded.

## 2022-06-26 NOTE — Telephone Encounter (Signed)
Left message to patient to call back

## 2022-06-27 NOTE — Telephone Encounter (Signed)
Call placed to patient.  Left message to call office 

## 2022-06-27 NOTE — Telephone Encounter (Signed)
Patient returned RN's call.  Patient stated his BP today 151/86  HR 60.

## 2022-07-10 ENCOUNTER — Ambulatory Visit: Payer: Medicare PPO | Admitting: Neurology

## 2022-07-10 ENCOUNTER — Encounter: Payer: Self-pay | Admitting: Neurology

## 2022-07-10 VITALS — BP 147/91 | HR 70 | Ht 71.0 in | Wt 197.0 lb

## 2022-07-10 DIAGNOSIS — F32A Depression, unspecified: Secondary | ICD-10-CM

## 2022-07-10 DIAGNOSIS — C61 Malignant neoplasm of prostate: Secondary | ICD-10-CM | POA: Diagnosis not present

## 2022-07-10 DIAGNOSIS — F03B0 Unspecified dementia, moderate, without behavioral disturbance, psychotic disturbance, mood disturbance, and anxiety: Secondary | ICD-10-CM

## 2022-07-10 MED ORDER — DONEPEZIL HCL 5 MG PO TABS: 5.0000 mg | ORAL_TABLET | Freq: Every day | ORAL | 0 refills | Status: DC

## 2022-07-10 NOTE — Progress Notes (Signed)
[facility]  PATIENT: Dennis Huffman DOB: 1950/07/21  REFERRING DOCTOR OR PCP:  Dr. Lysle Rubens SOURCE: Patient, Notes from Dr. Lysle Rubens, laboratory reports,  _________________________________   HISTORICAL  CHIEF COMPLAINT:  Chief Complaint  Patient presents with   Room 10    Pt is here Alone. Pt states that his memory is okay.     HISTORY OF PRESENT ILLNESS: Dennis Huffman is a 72 y.o. man withmemory loss.  Update 07/10/2022: He is a 72 year old man who has noticed difficulties with his memory over the past 3 years.    The onset was gradual over this time.  I have seen him twice in the past" he had mild cognitive impairment but he was not in the dementia range.  However, he is doing worse.  He notes being forgetful but that it is not much of a problem.  He denies much depression but does note mild apathy compared to earlier this year.[name]  He tries to get outdoors some but is not going to the gym as he used to.     He served in [place] and denies any PTSD symptoms.      He is sleeping more poorly.   He typically goes to bed at 11 pm but wakes up around 2 am and then has trouble getting back to sleep.  Melatonin has helped some.   He denies being told that he snores.      He had thyroid tests and B12 tests when symptoms started..  B12 was 317 and TSH 1.07.  He has a normal gait for age.  Strength is normal.  He notes some tingling in the left arm but no numbness in feet.      He has a h/o of prostate cancer, treated with radiation.     He has a 9th grade education.      07/10/2022   11:00 AM 09/15/2020   10:56 AM 09/30/2019    9:33 AM 06/01/2019    2:33 PM  Montreal Cognitive Assessment   Visuospatial/ Executive (0/5) '2 3 5 4  '$ Naming (0/3) '3 3 2 3  '$ Attention: Read list of digits (0/2) '1 2 2 2  '$ Attention: Read list of letters (0/1) '1 1 1 1  '$ Attention: Serial 7 subtraction starting at 100 (0/3) 0 0 1 3  Language: Repeat phrase (0/2) '2 2 2 2  '$ Language : Fluency  (0/1) 1 0 1 0  Abstraction (0/2) '1 2 2 '$ 0  Delayed Recall (0/5) 0 0 0 0  Orientation (0/6) '4 6 5 5  '$ Total '15 19 21 20  '$ Adjusted Score (based on education) '16  22 21   '$ (07/10/22: He was able to spell world backwards DL ROW)  Imaging:  MRI of the brain 06/29/2019 was normal for age.  Specifically, brain volume was normal for age and just minimal chronic microvascular ischemic change.  REVIEW OF SYSTEMS: Constitutional: No fevers, chills, sweats, or change in appetite Eyes: No visual changes, double vision, eye pain Ear, nose and throat: No hearing loss, ear pain, nasal congestion, sore throat Cardiovascular: No chest pain, palpitations Respiratory:  No shortness of breath at rest or with exertion.   No wheezes GastrointestinaI: No nausea, vomiting, diarrhea, abdominal pain, fecal incontinence Genitourinary:  No dysuria, urinary retention or frequency.  No nocturia. Musculoskeletal:  No neck pain, back pain Integumentary: No rash, pruritus, skin lesions Neurological: as above Psychiatric: No depression at this time.  No anxiety Endocrine: No palpitations, diaphoresis, change  in appetite, change in weigh or increased thirst Hematologic/Lymphatic:  No anemia, purpura, petechiae. Allergic/Immunologic: No itchy/runny eyes, nasal congestion, recent allergic reactions, rashes  ALLERGIES: No Active Allergies   HOME MEDICATIONS:  Current Outpatient Medications:    acetaminophen (TYLENOL) 650 MG CR tablet, Take 650-1,300 mg by mouth every 8 (eight) hours as needed for pain., Disp: , Rfl:    amLODipine (NORVASC) 5 MG tablet, Take 2 tablets (10 mg total) by mouth every evening., Disp: 60 tablet, Rfl: 0   aspirin EC 81 MG tablet, Take 1 tablet (81 mg total) by mouth daily. Swallow whole., Disp: 90 tablet, Rfl: 3   atorvastatin (LIPITOR) 10 MG tablet, Take 10 mg by mouth daily., Disp: , Rfl:    donepezil (ARICEPT) 5 MG tablet, Take 1 tablet (5 mg total) by mouth at bedtime., Disp: 30 tablet, Rfl:  0   losartan (COZAAR) 100 MG tablet, Take 100 mg by mouth daily., Disp: , Rfl:    magnesium oxide (MAG-OX) 400 MG tablet, Take 400 mg by mouth daily., Disp: , Rfl:    Melatonin 10 MG CAPS, Take 10-20 mg by mouth at bedtime., Disp: , Rfl:    metoprolol succinate (TOPROL-XL) 25 MG 24 hr tablet, TAKE 1 TABLET BY MOUTH DAILY AS NEEDED FOR PALPITATIONS, Disp: 30 tablet, Rfl: 11   Multiple Vitamins-Minerals (MULTI FOR HIM 50+) TABS, Take 1 tablet by mouth daily., Disp: , Rfl:    Nutritional Supplements (IMMUNE ENHANCE) TABS, Take 1 tablet by mouth daily., Disp: , Rfl:    predniSONE (DELTASONE) 20 MG tablet, Take 2 tablets (40 mg total) by mouth daily., Disp: 10 tablet, Rfl: 0  PAST MEDICAL HISTORY: Past Medical History:  Diagnosis Date   Arthritis    cervical spondylotic, radiculopathy   Bradycardia    a. HR previously reported as 40 at PCP's office; event monitor 2017 showed nocturnal bradycardia but no daytime findings.   CKD (chronic kidney disease), stage II    Hypercholesteremia    Hypertension    Premature atrial contractions    Prostate cancer (HCC)    PVC's (premature ventricular contractions)     PAST SURGICAL HISTORY: Past Surgical History:  Procedure Laterality Date   ANTERIOR CERVICAL DECOMP/DISCECTOMY FUSION N/A 05/12/2015   Procedure: ANTERIOR CERVICAL DECOMPRESSION/DISCECTOMY FUSION C3 - C6 3 LEVELS;  Surgeon: Melina Schools, MD;  Location: [facility];  Service: Orthopedics;  Laterality: N/A;   colonscopy     COLONSCOPY      INSERTION PROSTATE RADIATION SEED  UNSURE OF DATE   DONE BY DR Karsten Ro    PROSTATE BIOPSY     ROTATOR CUFF REPAIR Right 03-13-12; 09-27-15   TOTAL HIP ARTHROPLASTY Left 11/28/2018   Procedure: TOTAL HIP ARTHROPLASTY;  Surgeon: Earlie Server, MD;  Location: [facility];  Service: Orthopedics;  Laterality: Left;    FAMILY HISTORY: Family History  Problem Relation Age of Onset   Healthy Mother    Healthy Father    Stroke Sister    Cancer Sister        breast     SOCIAL HISTORY:  Social History   Socioeconomic History   Marital status: Single    Spouse name: Not on file   Number of children: Not on file   Years of education: Not on file   Highest education level: Not on file  Occupational History   Not on file  Tobacco Use   Smoking status: Former    Types: Cigarettes    Quit date: 06/26/1971  Years since quitting: 51.0   Smokeless tobacco: Never  Vaping Use   Vaping Use: Never used  Substance and Sexual Activity   Alcohol use: Yes    Comment: socially    Drug use: No   Sexual activity: Yes  Other Topics Concern   Not on file  Social History Narrative   Right handed    Caffeine use: 1 cup coffee per day   Soda- not on a regular basis   Lives with mother, sister, brother, and nephew.   Social Determinants of Health   Financial Resource Strain: Not on file  Food Insecurity: Not on file  Transportation Needs: Not on file  Physical Activity: Not on file  Stress: Not on file  Social Connections: Not on file  Intimate Partner Violence: Not on file     PHYSICAL EXAM  Vitals:   07/10/22 1045  BP: (!) 147/91  Pulse: 70  Weight: 197 lb (89.4 kg)  Height: '5\' 11"'$  (1.803 m)     Body mass index is 27.48 kg/m.   General: The patient is well-developed and well-nourished and in no acute distress  HEENT:  Head is Valentine/AT.  Sclera are anicteric.  Funduscopic exam shows normal optic discs and retinal vessels.  Neck: No carotid bruits are noted.  The neck is nontender.  Cardiovascular: The heart has a regular rate and rhythm with a normal S1 and S2. There were no murmurs, gallops or rubs.    Skin: Extremities are without rash or  edema.  Musculoskeletal:  Back is nontender  Neurologic Exam  Mental status: The patient is alert and oriented x 2 at the time of the examination. The patient has reduced recent memory, with reduced attention span for numbers (100-?)  Speech is normal.  MoCa score is 16/30  Cranial nerves:  Extraocular movements are full.  Facial strength and sensation was normal.  No dysarthria.. No obvious hearing deficits are noted.  Motor:  Muscle bulk is normal.   Tone is normal. Strength is  5 / 5 in all 4 extremities.   Sensory: Sensory testing is intact to pinprick, soft touch and vibration sensation in all 4 extremities.  Coordination: Cerebellar testing reveals good finger-nose-finger and heel-to-shin bilaterally.  Gait and station: Station is normal.   Gait is normal. Tandem gait is normal. Romberg is negative.   Reflexes: Deep tendon reflexes are symmetric and normal bilaterally.       DIAGNOSTIC DATA (LABS, IMAGING, TESTING) - I reviewed patient records, labs, notes, testing and imaging myself where available.  Lab Results  Component Value Date   WBC 3.7 (L) 05/03/2022   HGB 12.0 (L) 05/03/2022   HCT 34.2 (L) 05/03/2022   MCV 80.5 05/03/2022   PLT 157 05/03/2022       ASSESSMENT AND PLAN  Moderate dementia without behavioral disturbance, psychotic disturbance, mood disturbance, or anxiety, unspecified dementia type ([place]) - Plan: ATN PROFILE  Mild depression  Prostate cancer ([place])    Performance has gradually worsened, c/w mild dementia.  We will check new Alzheimer panel (checks AB42/40 ratio and [id number]).  If normal and cognitiive issues worsen, can recheck MRI to rule out other etiology.  We will start donepezil for suspected AD. Rtc prn    Jetty Berland A. Felecia Shelling, MD, [facility] 9/37/1696, 78:93 PM Certified in Neurology, Clinical Neurophysiology, Sleep Medicine and Neuroimaging  [facility] [address] [place], [place] [address] [phone]

## 2022-07-10 NOTE — Telephone Encounter (Signed)
Left message for patient to call office.  

## 2022-07-17 NOTE — Telephone Encounter (Signed)
Left message to call office

## 2022-07-19 NOTE — Telephone Encounter (Signed)
Patient returned RN call and stated he can be reached on his cell or home number

## 2022-07-19 NOTE — Telephone Encounter (Signed)
OK to add spironolactone 25 mg daily.  BMet in 1 week.  COntinue to monitor BP at home and send [place] readings.  F/u in 4 weeks in Pharm D HTN clinic

## 2022-07-19 NOTE — Telephone Encounter (Signed)
Patient wants to know next steps regarding his high blood pressure

## 2022-07-19 NOTE — Telephone Encounter (Signed)
Left message to call office

## 2022-07-19 NOTE — Telephone Encounter (Signed)
I spoke with patient who reports the following readings- 07/19/22-157/95,59 07/18/22-162/98,60 07/17/22-188/89,48 He has been checking daily but does not have any other readings for January available. 06/17/22-162/98,60. States he is taking his blood pressure medications. Has had tinnitus for awhile and has seen ENT for this. Will send readings to Dr Irish Lack for review

## 2022-07-19 NOTE — Telephone Encounter (Signed)
I spoke with Dennis Huffman and let her know three messages have been left for patient to call office but he has not returned call.

## 2022-07-20 LAB — ATN PROFILE
A -- Beta-amyloid 42/40 Ratio: 0.076 — ABNORMAL LOW (ref >0.102)
Beta-amyloid 40: 290.12 pg/mL
Beta-amyloid 42: 21.91 pg/mL
N -- NfL, Plasma: 3.66 pg/mL (ref 0.00–7.64)
T -- p-tau181: 1.04 pg/mL — ABNORMAL HIGH (ref 0.00–0.97)

## 2022-07-20 NOTE — Telephone Encounter (Signed)
Left the pt a message to call the office back to endorse recommendations per Dr. Irish Lack.

## 2022-07-23 ENCOUNTER — Other Ambulatory Visit: Payer: Self-pay | Admitting: *Deleted

## 2022-07-23 ENCOUNTER — Telehealth: Payer: Self-pay | Admitting: Neurology

## 2022-07-23 DIAGNOSIS — E7800 Pure hypercholesterolemia, unspecified: Secondary | ICD-10-CM | POA: Diagnosis not present

## 2022-07-23 DIAGNOSIS — N182 Chronic kidney disease, stage 2 (mild): Secondary | ICD-10-CM | POA: Diagnosis not present

## 2022-07-23 DIAGNOSIS — K219 Gastro-esophageal reflux disease without esophagitis: Secondary | ICD-10-CM | POA: Diagnosis not present

## 2022-07-23 DIAGNOSIS — I10 Essential (primary) hypertension: Secondary | ICD-10-CM | POA: Diagnosis not present

## 2022-07-23 MED ORDER — DONEPEZIL HCL 10 MG PO TABS: 10.0000 mg | ORAL_TABLET | Freq: Every day | ORAL | 5 refills | Status: DC

## 2022-07-23 NOTE — Telephone Encounter (Signed)
Pt returning call

## 2022-07-23 NOTE — Telephone Encounter (Signed)
Pt asking for a call back to clarify dosage for donepezil (ARICEPT) 10 MG tablet. Please call back after 1 pm.

## 2022-07-23 NOTE — Telephone Encounter (Signed)
LMTCB on 07/23/22 @ 1653

## 2022-07-23 NOTE — Telephone Encounter (Signed)
I called patient and discussed dosage change with him and informed him a new Rx was sent to [facility] pharmacy.

## 2022-07-23 NOTE — Telephone Encounter (Signed)
Pt called again. Stated someone called him and changed dosage on his medication  donepezil (ARICEPT) 10 MG tablet. Pt said he have questions.

## 2022-07-26 ENCOUNTER — Telehealth: Payer: Self-pay | Admitting: Licensed Clinical Social Worker

## 2022-07-26 MED ORDER — SPIRONOLACTONE 25 MG PO TABS: 25.0000 mg | ORAL_TABLET | Freq: Every day | ORAL | 3 refills | Status: DC

## 2022-07-26 NOTE — Telephone Encounter (Signed)
OK to add spironolactone 25 mg daily.  BMet in 1 week.  COntinue to monitor BP at home and send [place] readings.  F/u in 4 weeks in Pharm D HTN clinic    Placed order for spironolactone 25 mg by mouth daily. Patient will come in next Thursday for lab work. Put in referral for HTN clinic. Patient kept saying the same thing over and over again, almost like he forgot he had already told me what he just said. Concerned patient might be having memory issues, placed referral for social worker.

## 2022-07-26 NOTE — Telephone Encounter (Signed)
Tried to call patient's DPR list, to make sure someone can help patient remember medication changes and up coming lab appointment. Left message for them to call back.

## 2022-07-26 NOTE — Telephone Encounter (Signed)
H&V Care Navigation CSW Progress Note  Clinical Social Worker  received referral  to f/u on concerns about medication management. Spoke with nursing staff Pam who share that they spoke with pt and they are concerned as pt repeated many things and did not seem to remember any instructions given. LCSW reviewed chart, many notes from neurology and psychology about memory loss dx, encouraged RN to f/u with pt DPR contacts to share concerns and see if pt could come in for medication management appt with RN or PharmD to complete pill organizer and review any changes. I also shared that pill packs are commonly used for patients with challenges managing medications but aren't ideal for patients with frequent medication changes. Unfortunately there aren't in home services that provide solely medication management at this time. I remain available should additional questions arise/resources be needed.   Patient is participating in a Managed Medicaid Plan:  No, [employer] Medicare  SDOH Screenings   Tobacco Use: Medium Risk (07/10/2022)   Westley Hummer, MSW, [place]  [phone]- work cell phone (preferred) [phone]- desk phone

## 2022-07-26 NOTE — Addendum Note (Signed)
Addended by: Aris Georgia, Elan Brainerd L on: 07/26/2022 03:01 PM   Modules accepted: Orders

## 2022-07-26 NOTE — Telephone Encounter (Signed)
Attempted to reach patient again with Dr Hassell Done recommendations to start Arlyce Harman '25mg'$  and do repeat BMET & F/u with PharmD. Still no answer at primary number, tried secondary number instead. No answer there either, but left additional message asking that he call office back.

## 2022-08-02 ENCOUNTER — Ambulatory Visit: Payer: Medicare PPO | Attending: Interventional Cardiology

## 2022-08-02 DIAGNOSIS — I10 Essential (primary) hypertension: Secondary | ICD-10-CM | POA: Diagnosis not present

## 2022-08-02 LAB — BASIC METABOLIC PANEL
BUN/Creatinine Ratio: 8 — ABNORMAL LOW (ref 10–24)
BUN: 10 mg/dL (ref 8–27)
CO2: 28 mmol/L (ref 20–29)
Calcium: 9.3 mg/dL (ref 8.6–10.2)
Chloride: 104 mmol/L (ref 96–106)
Creatinine, Ser: 1.19 mg/dL (ref 0.76–1.27)
Glucose: 90 mg/dL (ref 70–99)
Potassium: 3.8 mmol/L (ref 3.5–5.2)
Sodium: 142 mmol/L (ref 134–144)
eGFR: 64 mL/min/{1.73_m2} (ref >59)

## 2022-08-06 DIAGNOSIS — H903 Sensorineural hearing loss, bilateral: Secondary | ICD-10-CM | POA: Diagnosis not present

## 2022-08-06 DIAGNOSIS — H9313 Tinnitus, bilateral: Secondary | ICD-10-CM | POA: Diagnosis not present

## 2022-08-09 NOTE — Telephone Encounter (Signed)
Call placed to patient to schedule appointment in hypertension clinic.  Left message to call office.  Scheduling team has also tried to reach patient to schedule this appointment.

## 2022-08-22 ENCOUNTER — Encounter: Payer: Self-pay | Admitting: *Deleted

## 2022-08-22 NOTE — Telephone Encounter (Signed)
Office has been unable to contact patient to schedule appointment.  Letter sent to patient.

## 2022-08-29 ENCOUNTER — Other Ambulatory Visit: Payer: Self-pay

## 2022-09-17 DIAGNOSIS — E7800 Pure hypercholesterolemia, unspecified: Secondary | ICD-10-CM | POA: Diagnosis not present

## 2022-09-17 DIAGNOSIS — K219 Gastro-esophageal reflux disease without esophagitis: Secondary | ICD-10-CM | POA: Diagnosis not present

## 2022-09-17 DIAGNOSIS — N182 Chronic kidney disease, stage 2 (mild): Secondary | ICD-10-CM | POA: Diagnosis not present

## 2022-09-17 DIAGNOSIS — I10 Essential (primary) hypertension: Secondary | ICD-10-CM | POA: Diagnosis not present

## 2022-09-25 ENCOUNTER — Other Ambulatory Visit: Payer: Self-pay | Admitting: *Deleted

## 2022-09-25 DIAGNOSIS — F03B0 Unspecified dementia, moderate, without behavioral disturbance, psychotic disturbance, mood disturbance, and anxiety: Secondary | ICD-10-CM

## 2022-09-25 MED ORDER — DONEPEZIL HCL 10 MG PO TABS
10.0000 mg | ORAL_TABLET | Freq: Every day | ORAL | 2 refills | Status: AC
Start: 2022-09-25 — End: ?

## 2022-10-08 ENCOUNTER — Ambulatory Visit: Payer: Medicare PPO

## 2022-10-08 NOTE — Progress Notes (Unsigned)
Patient ID: BOSTON BLAIZE                 DOB: 12-04-1950                      MRN: [id number]     HPI: Dennis Huffman is a 72 y.o. male referred by Dr. Eldridge Dace to HTN clinic. PMH is significant for HTN, GERD, HLD, CKD, and history of prostate cancer treated with radiation. Patient reported to ED on 4 different occasions between 03/2022 and 04/2022 with concerns for elevated BP. No changes were made to patient's regimen at the time (amlodipine 5 mg BID, losartan 100 mg daily). Patient called Dr. Hoyle Barr office and reported the following BP readings:   07/19/22-157/95,59 07/18/22-162/98,60 07/17/22-188/89,48  Dr. Eldridge Dace recommended starting spironolactone 25 mg daily at this time. It seems the patient has picked up a 90-day supply on 07/26/2022. BMET on 08/02/2022 showed SCr 1.19 and K 3.8.   Per RN, patient exhibited some signs of memory issues over the phone and placed a referral for a Child psychotherapist.   Clinical Social Worker  received referral  to f/u on concerns about medication management. Spoke with nursing staff Pam who share that they spoke with pt and they are concerned as pt repeated many things and did not seem to remember any instructions given. LCSW reviewed chart, many notes from neurology and psychology about memory loss dx, encouraged RN to f/u with pt DPR contacts to share concerns and see if pt could come in for medication management appt with RN or PharmD to complete pill organizer and review any changes. I also shared that pill packs are commonly used for patients with challenges managing medications but aren't ideal for patients with frequent medication changes. Unfortunately there aren't in home services that provide solely medication management at this time. I remain available should additional questions arise/resources be needed.   Current HTN meds: amlodipine 5 mg BID, losartan 100 mg daily, spironolactone 25 mg daily, metoprolol succinate 25 mg daily PRN palpitations  BP  goal: < 130/80 mmHg  Family History:   Social History:   Diet:   Exercise:   Home BP readings:   Wt Readings from Last 3 Encounters:  07/10/22 197 lb (89.4 kg)  05/03/22 212 lb (96.2 kg)  04/24/22 212 lb (96.2 kg)   BP Readings from Last 3 Encounters:  07/10/22 (!) 147/91  05/24/22 (!) 155/90  05/07/22 131/72   Pulse Readings from Last 3 Encounters:  07/10/22 70  05/24/22 (!) 50  05/07/22 (!) 42    Renal function: CrCl cannot be calculated (Patient's most recent lab result is older than the maximum 21 days allowed.).  Past Medical History:  Diagnosis Date   Arthritis    cervical spondylotic, radiculopathy   Bradycardia    a. HR previously reported as 40 at PCP's office; event monitor 2017 showed nocturnal bradycardia but no daytime findings.   CKD (chronic kidney disease), stage II    Hypercholesteremia    Hypertension    Premature atrial contractions    Prostate cancer (HCC)    PVC's (premature ventricular contractions)     Current Outpatient Medications on File Prior to Visit  Medication Sig Dispense Refill   acetaminophen (TYLENOL) 650 MG CR tablet Take 650-1,300 mg by mouth every 8 (eight) hours as needed for pain.     amLODipine (NORVASC) 5 MG tablet Take 2 tablets (10 mg total) by mouth every evening. 60 tablet 0  aspirin EC 81 MG tablet Take 1 tablet (81 mg total) by mouth daily. Swallow whole. 90 tablet 3   atorvastatin (LIPITOR) 10 MG tablet Take 10 mg by mouth daily.     donepezil (ARICEPT) 10 MG tablet Take 1 tablet (10 mg total) by mouth at bedtime. 90 tablet 2   losartan (COZAAR) 100 MG tablet Take 100 mg by mouth daily.     magnesium oxide (MAG-OX) 400 MG tablet Take 400 mg by mouth daily.     Melatonin 10 MG CAPS Take 10-20 mg by mouth at bedtime.     metoprolol succinate (TOPROL-XL) 25 MG 24 hr tablet TAKE 1 TABLET BY MOUTH DAILY AS NEEDED FOR PALPITATIONS 30 tablet 11   Multiple Vitamins-Minerals (MULTI FOR HIM 50+) TABS Take 1 tablet by mouth  daily.     Nutritional Supplements (IMMUNE ENHANCE) TABS Take 1 tablet by mouth daily.     predniSONE (DELTASONE) 20 MG tablet Take 2 tablets (40 mg total) by mouth daily. 10 tablet 0   spironolactone (ALDACTONE) 25 MG tablet Take 1 tablet (25 mg total) by mouth daily. 90 tablet 3   No current facility-administered medications on file prior to visit.    No Active Allergies   Assessment/Plan:  1. Hypertension -

## 2022-10-10 ENCOUNTER — Encounter: Payer: Self-pay | Admitting: Student

## 2022-10-10 ENCOUNTER — Ambulatory Visit: Payer: Medicare PPO | Attending: Internal Medicine | Admitting: Student

## 2022-10-10 VITALS — BP 128/80 | HR 60

## 2022-10-10 DIAGNOSIS — I10 Essential (primary) hypertension: Secondary | ICD-10-CM

## 2022-10-10 NOTE — Patient Instructions (Addendum)
No Medication changes made by your pharmacist Carmela Hurt, PharmD at today's visit     Bring all of your meds, your BP cuff and your record of home blood pressures to your next appointment.    HOW TO TAKE YOUR BLOOD PRESSURE AT HOME  Rest 5 minutes before taking your blood pressure.  Don't smoke or drink caffeinated beverages for at least 30 minutes before. Take your blood pressure before (not after) you eat. Sit comfortably with your back supported and both feet on the floor (don't cross your legs). Elevate your arm to heart level on a table or a desk. Use the proper sized cuff. It should fit smoothly and snugly around your bare upper arm. There should be enough room to slip a fingertip under the cuff. The bottom edge of the cuff should be 1 inch above the crease of the elbow. Ideally, take 3 measurements at one sitting and record the average.  Important lifestyle changes to control high blood pressure  Intervention  Effect on the BP  Lose extra pounds and watch your waistline Weight loss is one of the most effective lifestyle changes for controlling blood pressure. If you're overweight or obese, losing even a small amount of weight can help reduce blood pressure. Blood pressure might go down by about 1 millimeter of mercury (mm Hg) with each kilogram (about 2.2 pounds) of weight lost.  Exercise regularly As a general goal, aim for at least 30 minutes of moderate physical activity every day. Regular physical activity can lower high blood pressure by about 5 to 8 mm Hg.  Eat a healthy diet Eating a diet rich in whole grains, fruits, vegetables, and low-fat dairy products and low in saturated fat and cholesterol. A healthy diet can lower high blood pressure by up to 11 mm Hg.  Reduce salt (sodium) in your diet Even a small reduction of sodium in the diet can improve heart health and reduce high blood pressure by about 5 to 6 mm Hg.  Limit alcohol One drink equals 12 ounces of beer, 5  ounces of wine, or 1.5 ounces of 80-proof liquor.  Limiting alcohol to less than one drink a day for women or two drinks a day for men can help lower blood pressure by about 4 mm Hg.   If you have any questions or concerns please use My Chart to send questions or call the office at [phone]

## 2022-10-10 NOTE — Progress Notes (Unsigned)
Patient ID: [name]                 DOB: 27-Oct-1950                      MRN: [id number]      HPI: Dennis Huffman is a 72 y.o. male referred by Dr. Eldridge Dace to HTN clinic. PMH is significant for hypertension, hx of bradycardia from metoprolol 25 mg daily dose, prostate cancer, HDL,Aortic atherosclerosis.  July 2023 visit with Dr. Eldridge Dace metoprolol was changed from daily does to PRN dose due to bradycardia. On 01/25 patient home BP remained elevated so spironolcatone 25 mg was added to his other BP medications.   Patient presented today for hypertension clinic. Forgot to bring home BP readings. Only remember top numbers it runs around 140-145 does not recall bottom numbers and heart rate. His home arm cuff is never validated. He use to go to gym but he does not now and not interested in re-joining. He does not know what BP medications he is currently on. About a month [facility] all [facility] and kept it stated they were expired. Since then he is unsure what he is taking. Will call later today to check what medications he is currently on.    Current HTN meds: amlodipine 10 mg daily,spironolactone 25 mg daily, losartan 100 mg  BP goal: <130/80    Social History:  Smoking: none  Alcohol: none   Diet: has stopped eating out  Exercise: none    Home BP readings: 140-145/does not remember heart rate: does not remember    Wt Readings from Last 3 Encounters:  07/10/22 197 lb (89.4 kg)  05/03/22 212 lb (96.2 kg)  04/24/22 212 lb (96.2 kg)   BP Readings from Last 3 Encounters:  10/10/22 128/80  07/10/22 (!) 147/91  05/24/22 (!) 155/90   Pulse Readings from Last 3 Encounters:  10/10/22 60  07/10/22 70  05/24/22 (!) 50    Renal function: CrCl cannot be calculated (Patient's most recent lab result is older than the maximum 21 days allowed.).  Past Medical History:  Diagnosis Date   Arthritis    cervical spondylotic, radiculopathy    Bradycardia    a. HR previously reported as 40 at PCP's office; event monitor 2017 showed nocturnal bradycardia but no daytime findings.   CKD (chronic kidney disease), stage II    Hypercholesteremia    Hypertension    Premature atrial contractions    Prostate cancer    PVC's (premature ventricular contractions)     Current Outpatient Medications on File Prior to Visit  Medication Sig Dispense Refill   amLODipine (NORVASC) 5 MG tablet Take 2 tablets (10 mg total) by mouth every evening. 60 tablet 0   atorvastatin (LIPITOR) 10 MG tablet Take 10 mg by mouth daily.     donepezil (ARICEPT) 10 MG tablet Take 1 tablet (10 mg total) by mouth at bedtime. 90 tablet 2   losartan (COZAAR) 100 MG tablet Take 100 mg by mouth daily.     metoprolol succinate (TOPROL-XL) 25 MG 24 hr tablet TAKE 1 TABLET BY MOUTH DAILY AS NEEDED FOR PALPITATIONS 30 tablet 11   pantoprazole (PROTONIX) 40 MG tablet Take 40 mg by mouth daily.     acetaminophen (TYLENOL) 650 MG CR tablet Take 650-1,300 mg by mouth every 8 (eight) hours as needed for pain.     aspirin EC 81 MG  tablet Take 1 tablet (81 mg total) by mouth daily. Swallow whole. 90 tablet 3   magnesium oxide (MAG-OX) 400 MG tablet Take 400 mg by mouth daily.     Melatonin 10 MG CAPS Take 10-20 mg by mouth at bedtime.     Multiple Vitamins-Minerals (MULTI FOR HIM 50+) TABS Take 1 tablet by mouth daily. (Patient not taking: Reported on 10/10/2022)     Nutritional Supplements (IMMUNE ENHANCE) TABS Take 1 tablet by mouth daily. (Patient not taking: Reported on 10/10/2022)     predniSONE (DELTASONE) 20 MG tablet Take 2 tablets (40 mg total) by mouth daily. 10 tablet 0   spironolactone (ALDACTONE) 25 MG tablet Take 1 tablet (25 mg total) by mouth daily. (Patient not taking: Reported on 10/10/2022) 90 tablet 3   No current facility-administered medications on file prior to visit.    No Active Allergies  Blood pressure 128/80, pulse 60, SpO2 99  %.   Assessment/Plan:  1. Hypertension -  Essential hypertension Assessment: BP is controlled in office BP 128/80 mmHg  (goal <130/80) Very confuse about his medications does not know what BP medications he is on  Only recalls SBP readings from home 140-145 mmHg does not recalls DBP or HR  Denies SOB, palpitation, chest pain, headaches,or swelling Reiterated the importance of regular exercise and low salt diet  Does not want to restart regular exercise but motivated to cut down on salt intake  Called him after appointment - reports he is taking amlodipine 5 mg daily  and losartan 100 mg daily  Plan:  Given optimal BP in the office not changing BP medications  Patient to keep record of BP readings with heart rate and report to [place] at the next visit Patient to bring home BP monitor for validation  Patient to see PharmD in 5 weeks for follow up  Follow up lab(s): none       Thank you  Carmela Hurt, Pharm.D [facility] Division [facility] [address] [place], [place] [address]  Phone: [phone]; Fax: [phone]

## 2022-10-10 NOTE — Assessment & Plan Note (Addendum)
Assessment: BP is controlled in office BP 128/80 mmHg  (goal <130/80) Very confuse about his medications does not know what BP medications he is on  Only recalls SBP readings from home 140-145 mmHg does not recalls DBP or HR  Denies SOB, palpitation, chest pain, headaches,or swelling Reiterated the importance of regular exercise and low salt diet  Does not want to restart regular exercise but motivated to cut down on salt intake  Called him after appointment - reports he is taking amlodipine 5 mg daily  and losartan 100 mg daily  Plan:  Given optimal BP in the office not changing BP medications  Patient to keep record of BP readings with heart rate and report to [place] at the next visit Patient to bring home BP monitor for validation  Patient to see PharmD in 5 weeks for follow up  Follow up lab(s): none

## 2022-10-18 ENCOUNTER — Encounter: Payer: Medicare PPO | Attending: Psychology | Admitting: Psychology

## 2022-10-18 ENCOUNTER — Encounter: Payer: Self-pay | Admitting: Psychology

## 2022-10-18 DIAGNOSIS — F4321 Adjustment disorder with depressed mood: Secondary | ICD-10-CM

## 2022-10-18 DIAGNOSIS — F03A0 Unspecified dementia, mild, without behavioral disturbance, psychotic disturbance, mood disturbance, and anxiety: Secondary | ICD-10-CM | POA: Diagnosis not present

## 2022-10-18 DIAGNOSIS — R413 Other amnesia: Secondary | ICD-10-CM | POA: Diagnosis not present

## 2022-10-18 NOTE — Progress Notes (Signed)
Neuropsychological Evaluation   Patient:  Dennis Huffman   DOB: 03/03/1951  MR Number: [id number]  Location: [facility] FOR PAIN AND [facility] MEDICINE [facility] PHYSICAL MEDICINE & REHABILITATION [address] [place], [id number] [facility] [place] [address] Dept: [phone]  Start: 8 AM End: 9 AM  Provider/Observer:     Hershal Coria PsyD  Chief Complaint:      Chief Complaint  Patient presents with   Memory Loss    Reason For Service:     Dennis Huffman is a 72 year old male initially referred for neuropsychological evaluation by Shawnie Dapper, NP with [facility].  The patient has been describing difficulties with memory starting back in 2020 with a gradual onset over time.  Patient has been followed by Dr. Epimenio Foot for neurological care accompanied by Shawnie Dapper, NP.  There were no indications of depression but there was noted some mild apathy.  No changes in geographic orientation.  Previous neuropsychological evaluation had a clinical impression of mild major neurocognitive disorder due to multiple etiologies without behavioral disturbance.  I have included the results of that neuropsychological evaluation below for convenience.  Interpreted report suggest relative impairment consistent with mild frontal subcortical dysfunction consistent with a major neurocognitive disorder due to multiple etiologies most prominently vascular in nature.  The results were not particularly consistent with a progressive neurodegenerative dementia.  Today's visit was a follow-up clinical interview and scheduling for follow-up repeat testing for direct comparisons to 1 year prior.   During the clinical interview today, the patient reports that he has not identified any particular changes or worsening in memory but he is still having difficulties with short-term memory.  Patient reports that he continues to have tinnitus as well.  Patient reports that he continues to  have difficulty remembering like he used to but it is relatively the same.  Patient denies any changes in geographic orientation or visual spatial changes.  The patient also denied any changes in fine motor control or motor speed and denies any changes in gait.  Patient denies any visual hallucinations, tremors and also denies any changes in expressive language functions.  Patient reports that he is using calendars to keep up with important events but is paying his bills okay and doing fine managing IADLs.  Tests Administered: Repeatable Battery for the Assessment of Neuropsychological Status (RBANS); Form A  Participation Level:   Minimal  Participation Quality:  Inattentive      Behavioral Observation:  The patient appeared well-groomed and appropriately dressed. His manners were polite and appropriate to the situation. The patient was apathetic toward testing and his effort was poor (ex: no response to questions or responded with "I couldn't remember that even if I tried").   Well Groomed, Alert, and Depressed.   Test Results:   Below have included both the results from the 2022 evaluation as well as the most recent 2023 evaluation.  While the same setting and psychometrician were present for this most recent evaluation the validity of the most current testing results is called into question.  The patient was apathetic towards the testing and psychometrician noted that effort was poor and often gave no response to questions or responded that he could not remember if he had tried on measures or not.  Due to this validity concern for thorough evaluation and comparisons cannot be made confidently.  2022 Test results:  BANS, Form A: Standard Score/ Scaled Score Percentile    Total Score 72 3 Well  Below Average  Immediate Memory 65 1 Exceptionally Low  List Learning 4 2 Well Below Average  Story Memory 4 2 Well Below Average  Visuospatial/Constructional 100 50 Average  Figure Copy 14 91 Above  Average  Line Orientation 12/20 3-9 Well Below Average  Language 82 12 Below Average  Picture Naming 10/10 51-75 Average  Semantic Fluency 3 1 Exceptionally Low  Attention 91 27 Average  Digit Span 10 50 Average  Coding 7 16 Below Average  Delayed Memory 52 <1 Exceptionally Low  List Recall 0/10 <2 Exceptionally Low  List Recognition 14/20 <2 Exceptionally Low  Story Recall 1 <1 Exceptionally Low  Figure Recall 6 9 Below Average    2023 results:  RBANS Update Form A Total Scale 52  RBANS Update Form A Immediate Memory 40 List Learning1 Story Memory1  RBANS Update Form A Visuospatial/ Constructional 66 Figure Copy4 Line Orientation3-9  RBANS Update Form A Language 74 Semantic Fluency1 Picture [phone]  RBANS Update Form A Attention 79 Coding6 Digit Span7  RBANS Update Form A Delayed Memory 40 List Recall<=2 Story Recall1 Figure Recall1 List Recognition<=2  Summary of Results:   The results of the current neuropsychological evaluation have serious validity questions and direct comparisons to previous assessment is really not possible.  The patient was noted to be apathetic and not engage throughout the testing and I suspect concerns for not doing well impacted his performance.  During the clinical interview that happened.  Before the patient denied any change in his status over the prior year and felt that his status and symptoms that stayed relatively stable.  I did not have an opportunity to engage with other family members for their opinion.  On the most recent testing the patient's performances all fell significantly below those of the 2022 evaluation but making further interpretive remarks are impossible.  Impression/Diagnosis:   As the validity due to poor effort and engagement is brought into question interpretation and comparisons to previous evaluation are really not possible.  The patient's subjective reports include little to no change over the past year  although he was noted to have very flat affect and not being engaged with symptoms consistent with depression.  Clinical information for both 2022 and 2023 are consistent with depressed mood and I will add a diagnosis of adjustment disorder with depressed mood although major depressive disorder and possible depression due to his general medical status are all possible components.  The patient is continuing to take Aricept per neurology and the patient denies any particular side effects from this medication.  During his most recent appointment with Dr. Sater/neurology the patient acknowledged increasing apathy but denied any significant depressive symptoms.  At this point we will need to stick with the initial diagnosis of a mild major neurocognitive disorder due to multiple etiologies, memory loss and depressed mood.  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 Clinical Neuropsychologist

## 2022-11-05 ENCOUNTER — Ambulatory Visit: Payer: Medicare PPO | Attending: Internal Medicine | Admitting: Pharmacist

## 2022-11-05 ENCOUNTER — Encounter: Payer: Self-pay | Admitting: Pharmacist

## 2022-11-05 ENCOUNTER — Telehealth: Payer: Self-pay | Admitting: Pharmacist

## 2022-11-05 ENCOUNTER — Encounter: Payer: Medicare PPO | Attending: Psychology | Admitting: Psychology

## 2022-11-05 VITALS — BP 130/60 | HR 70

## 2022-11-05 DIAGNOSIS — I10 Essential (primary) hypertension: Secondary | ICD-10-CM

## 2022-11-05 DIAGNOSIS — R413 Other amnesia: Secondary | ICD-10-CM | POA: Insufficient documentation

## 2022-11-05 DIAGNOSIS — F4321 Adjustment disorder with depressed mood: Secondary | ICD-10-CM | POA: Insufficient documentation

## 2022-11-05 DIAGNOSIS — F03A0 Unspecified dementia, mild, without behavioral disturbance, psychotic disturbance, mood disturbance, and anxiety: Secondary | ICD-10-CM | POA: Insufficient documentation

## 2022-11-05 NOTE — Telephone Encounter (Addendum)
Received message from schedule that pt was on the phone wanting to reschedule his HTN visit from this AM because he did not come in and see anyone.  Pt did come to his visit this AM, appt was 6 hours ago and pt provided with after visit summary on his way out. Pt has memory decline and has been following with neuro, appears as though he forgot he was seen this morning. He was not accompanied by anyone during his visit. This was my first time seeing him, I'm not sure if this is his baseline or if this is a decline. 2 DPRs on file - called his daughter Mick Sell however a male answered her # and stated it was not her number. Called Deloris, no answer either. Will forward to neuro in case pt needs follow up or further evaluation.

## 2022-11-05 NOTE — Progress Notes (Signed)
Patient ID: Dennis Huffman                 DOB: [birth year]                      MRN: [id number]     HPI: Dennis Huffman is a 72 y.o. male referred by Dr. Eldridge Dace to HTN clinic. PMH is significant for HTN, aortic atherosclerosis, HLD, CKD, and PACs/PVCs. At 12/2021 MD visit, metoprolol was changed from daily to prn due to bradycardia. On 06/26/22, pt called in with elevated BP readings 150-180s/80-90s. He was advised by MD to start spironolactone 25mg  daily, repeat BMET in 1 week, and f/u with PharmD in 4 weeks, however multiple attempts were made to reach pt over the course of 2 months regarding this and he did not answer his phone or return calls. Has followed with neurology and psychology regarding memory loss, takes donepezil. Eventually seen by PharmD on 10/10/22 for follow up. Did not know what medications he was taking, had amlodipine 10mg  daily, spironolactone 25mg  daily, and losartan 100mg  daily on his med list. Was called after his visit to see what pill bottles he had at home and reported having amlodipine 5mg  daily and losartan 100mg  daily. Since his BP was at goal at that visit, no med changes were made, and pt was advised to monitor BP and bring in home cuff and readings to verify accuracy of his cuff.  Pt presents today for follow up. Did not bring in meds or BP cuff. Still not sure what he is taking. Reports he has amlodipine, losartan, and atorvastatin and is taking these. Reports he is not taking aspirin, spironolactone, or metoprolol (prn - not having palpitations). Not sure if he has pantoprazole. Asked about his donepezil and states he doesn't have memory problems but didn't clarify if he's taking the med. Denies palpitations, headache, blurred vision, and LE edema. Reports all his meds come from [facility] mail order pharmacy. Rest of info provided is repetitive from last visit - states he saw the [facility] the other day who kept many of meds since they were expired, and that his  systolic BP at home is 145. Denies high or low readings. Similar info was reported at 4/17 PharmD visit.  Current HTN meds: amlodipine 5mg  daily, losartan 100mg  daily Previously tried: metoprolol 25mg  - bradycardia BP goal: <130/52mmHg  Family History: The patient's family history includes Cancer in his sister; Stroke in his sister.   Social History: The patient  reports that he quit smoking about 50 years ago. His smoking use included cigarettes. He has never used smokeless tobacco. He reports current alcohol use. He reports that he does not use drugs.   Diet: Rarely drinks coffee, some tea. Doesn't add salt to his food.  Exercise: None and not interested in starting.  Wt Readings from Last 3 Encounters:  07/10/22 197 lb (89.4 kg)  05/03/22 212 lb (96.2 kg)  04/24/22 212 lb (96.2 kg)   BP Readings from Last 3 Encounters:  10/10/22 128/80  07/10/22 (!) 147/91  05/24/22 (!) 155/90   Pulse Readings from Last 3 Encounters:  10/10/22 60  07/10/22 70  05/24/22 (!) 50    Renal function: CrCl cannot be calculated (Patient's most recent lab result is older than the maximum 21 days allowed.).  Past Medical History:  Diagnosis Date   Arthritis    cervical spondylotic, radiculopathy   Bradycardia    a. HR previously reported as 40  at PCP's office; event monitor 2017 showed nocturnal bradycardia but no daytime findings.   CKD (chronic kidney disease), stage II    Hypercholesteremia    Hypertension    Premature atrial contractions    Prostate cancer (HCC)    PVC's (premature ventricular contractions)     Current Outpatient Medications on File Prior to Visit  Medication Sig Dispense Refill   acetaminophen (TYLENOL) 650 MG CR tablet Take 650-1,300 mg by mouth every 8 (eight) hours as needed for pain.     amLODipine (NORVASC) 5 MG tablet Take 2 tablets (10 mg total) by mouth every evening. 60 tablet 0   aspirin EC 81 MG tablet Take 1 tablet (81 mg total) by mouth daily. Swallow  whole. 90 tablet 3   atorvastatin (LIPITOR) 10 MG tablet Take 10 mg by mouth daily.     donepezil (ARICEPT) 10 MG tablet Take 1 tablet (10 mg total) by mouth at bedtime. 90 tablet 2   losartan (COZAAR) 100 MG tablet Take 100 mg by mouth daily.     magnesium oxide (MAG-OX) 400 MG tablet Take 400 mg by mouth daily.     Melatonin 10 MG CAPS Take 10-20 mg by mouth at bedtime.     metoprolol succinate (TOPROL-XL) 25 MG 24 hr tablet TAKE 1 TABLET BY MOUTH DAILY AS NEEDED FOR PALPITATIONS 30 tablet 11   Multiple Vitamins-Minerals (MULTI FOR HIM 50+) TABS Take 1 tablet by mouth daily. (Patient not taking: Reported on 10/10/2022)     Nutritional Supplements (IMMUNE ENHANCE) TABS Take 1 tablet by mouth daily. (Patient not taking: Reported on 10/10/2022)     pantoprazole (PROTONIX) 40 MG tablet Take 40 mg by mouth daily.     predniSONE (DELTASONE) 20 MG tablet Take 2 tablets (40 mg total) by mouth daily. 10 tablet 0   spironolactone (ALDACTONE) 25 MG tablet Take 1 tablet (25 mg total) by mouth daily. (Patient not taking: Reported on 10/10/2022) 90 tablet 3   No current facility-administered medications on file prior to visit.    No Active Allergies   Assessment/Plan:  1. Hypertension - BP is well controlled at appt today at goal < 130/1mmHg. Still not clear what pt is taking, he has been advised to bring in meds and home BP cuff to visit but has forgotten to do so. Memory issues are documented however pt denies this. Much of pt reported info today is repetitive from last PharmD visit a month ago. Advised him to continue his current meds which seem to be amlodipine 5mg  daily and losartan 100mg  daily. Advised pt to bring in his home meds and cuff to his annual follow up appt with Dr Eldridge Dace in July so that accuracy can be verified.  Rukia Mcgillivray E. Arby Dahir, PharmD, BCACP, CPP [facility] [address] [place], [place] [address] Phone: [phone]; Fax: [phone] 11/05/2022 10:09 AM

## 2022-11-05 NOTE — Patient Instructions (Addendum)
Your blood pressure goal is < 130/95mmHg   Continue taking your medications  Bring in your medication when you see Dr Eldridge Dace in July so we can make sure our list is accurate  Important lifestyle changes to control high blood pressure  Intervention  Effect on the BP   Weight loss Weight loss is one of the most effective lifestyle changes for controlling blood pressure. If you're overweight or obese, losing even a small amount of weight can help reduce blood pressure.    Blood pressure can decrease by 1 millimeter of mercury (mmHg) with each kilogram (about 2.2 pounds) of weight lost.   Exercise regularly As a general goal, aim for 30 minutes of moderate physical activity every day.    Regular physical activity can lower blood pressure by 5 - 8 mmHg.   Eat a healthy diet Eat a diet rich in whole grains, fruits, vegetables, lean meat, and low-fat dairy products. Limit processed foods, saturated fat, and sweets.    A heart-healthy diet can lower high blood pressure by 10 mmHg.   Reduce salt (sodium) in your diet Aim for 000mg  of sodium each day. Avoid deli meats, canned food, and frozen microwave meals which are high in sodium.     Limiting sodium can reduce blood pressure by 5 mmHg.   Limit alcohol One drink equals 12 ounces of beer, 5 ounces of wine, or 1.5 ounces of 80-proof liquor.    Limiting alcohol to < 1 drink a day for women or < 2 drinks a day for men can help lower blood pressure by about 4 mmHg.   To check your pressure at home you will need to:   Sit up in a chair, with feet flat on the floor and back supported. Do not cross your ankles or legs. Rest your left arm so that the cuff is about heart level. If the cuff goes on your upper arm, then just relax your arm on the table, arm of the chair, or your lap. If you have a wrist cuff, hold your wrist against your chest at heart level. Place the cuff snugly around your arm, about 1 inch above the crease of your  elbow. The cords should be inside the groove of your elbow.  Sit quietly, with the cuff in place, for about 5 minutes. Then press the power button to start a reading. Do not talk or move while the reading is taking place.  Record your readings on a sheet of paper. Although most cuffs have a memory, it is often easier to see a pattern developing when the numbers are all in front of you.  You can repeat the reading after 1-3 minutes if it is recommended.   Make sure your bladder is empty and you have not had caffeine or tobacco within the last 30 minutes   Always bring your blood pressure log with you to your appointments. If you have not brought your monitor in to be double checked for accuracy, please bring it to your next appointment.   You can find a list of validated (accurate) blood pressure cuffs at: validatebp.org

## 2022-11-09 DIAGNOSIS — Z77122 Contact with and (suspected) exposure to noise: Secondary | ICD-10-CM | POA: Diagnosis not present

## 2022-11-09 DIAGNOSIS — H903 Sensorineural hearing loss, bilateral: Secondary | ICD-10-CM | POA: Diagnosis not present

## 2022-11-09 DIAGNOSIS — H9193 Unspecified hearing loss, bilateral: Secondary | ICD-10-CM | POA: Diagnosis not present

## 2022-11-09 DIAGNOSIS — H9313 Tinnitus, bilateral: Secondary | ICD-10-CM | POA: Diagnosis not present

## 2023-01-04 DIAGNOSIS — Z08 Encounter for follow-up examination after completed treatment for malignant neoplasm: Secondary | ICD-10-CM | POA: Diagnosis not present

## 2023-01-04 DIAGNOSIS — M519 Unspecified thoracic, thoracolumbar and lumbosacral intervertebral disc disorder: Secondary | ICD-10-CM | POA: Diagnosis not present

## 2023-01-04 DIAGNOSIS — I700 Atherosclerosis of aorta: Secondary | ICD-10-CM | POA: Diagnosis not present

## 2023-01-04 DIAGNOSIS — I10 Essential (primary) hypertension: Secondary | ICD-10-CM | POA: Diagnosis not present

## 2023-01-04 DIAGNOSIS — F4320 Adjustment disorder, unspecified: Secondary | ICD-10-CM | POA: Diagnosis not present

## 2023-01-04 DIAGNOSIS — Z0000 Encounter for general adult medical examination without abnormal findings: Secondary | ICD-10-CM | POA: Diagnosis not present

## 2023-01-04 DIAGNOSIS — Z1331 Encounter for screening for depression: Secondary | ICD-10-CM | POA: Diagnosis not present

## 2023-01-04 DIAGNOSIS — R7303 Prediabetes: Secondary | ICD-10-CM | POA: Diagnosis not present

## 2023-01-04 DIAGNOSIS — N1831 Chronic kidney disease, stage 3a: Secondary | ICD-10-CM | POA: Diagnosis not present

## 2023-01-04 DIAGNOSIS — F03B0 Unspecified dementia, moderate, without behavioral disturbance, psychotic disturbance, mood disturbance, and anxiety: Secondary | ICD-10-CM | POA: Diagnosis not present

## 2023-01-04 DIAGNOSIS — Z8546 Personal history of malignant neoplasm of prostate: Secondary | ICD-10-CM | POA: Diagnosis not present

## 2023-01-04 DIAGNOSIS — E7800 Pure hypercholesterolemia, unspecified: Secondary | ICD-10-CM | POA: Diagnosis not present

## 2023-01-22 NOTE — Progress Notes (Deleted)
Cardiology Office Note   Date:  01/22/2023   ID:  Dennis Huffman, DOB 07-Jan-1951, MRN [id number]  PCP:  Georgann Housekeeper, MD    No chief complaint on file.    Wt Readings from Last 3 Encounters:  07/10/22 197 lb (89.4 kg)  05/03/22 212 lb (96.2 kg)  04/24/22 212 lb (96.2 kg)       History of Present Illness: Dennis KOWITZ is a 72 y.o. male   who had arm pain and was given hydrocodone in 2014.  At that time, he he noticed some palpitations so he stopped the medicine.  The palpitations then resolved.   ECG was done with Dr. Donette Larry and his HR was slow, per his report 40 bpm.  Due to his age, he was referred here to be evaluated for bradycardia and was seen in 2015.  He had a negative w/u at that time.    He no longer exercises regularly.  He had a hip replacement in 2016, neck operation in 2016 and then a right shoulder operation  Having some occasional skipped beats sensation.  It has decreased.    He avoids the pain meds now.     He had Prostate Cancer and had radiation. Testosterone was not recommended.     7/21 CTA aorta: "Greatest diameter of the ascending aorta on this non gated CT chest is estimated 37 mm. No comparison cross-sectional imaging available.   Small hiatal hernia   Minimal atherosclerosis of the thoracic aorta. Aortic Atherosclerosis (ICD10-I70.0)."   In early 2022, he has lost weight with portion control.    Had some dizziness in Jan 2022 after trying a memory supplement he ordered from a TV ad.  He stopped the medicine and sent it back to the company.       Past Medical History:  Diagnosis Date   Arthritis    cervical spondylotic, radiculopathy   Bradycardia    a. HR previously reported as 40 at PCP's office; event monitor 2017 showed nocturnal bradycardia but no daytime findings.   CKD (chronic kidney disease), stage II    Hypercholesteremia    Hypertension    Premature atrial contractions    Prostate cancer (HCC)    PVC's (premature  ventricular contractions)     Past Surgical History:  Procedure Laterality Date   ANTERIOR CERVICAL DECOMP/DISCECTOMY FUSION N/A 05/12/2015   Procedure: ANTERIOR CERVICAL DECOMPRESSION/DISCECTOMY FUSION C3 - C6 3 LEVELS;  Surgeon: Venita Lick, MD;  Location: [facility];  Service: Orthopedics;  Laterality: N/A;   colonscopy     COLONSCOPY      INSERTION PROSTATE RADIATION SEED  UNSURE OF DATE   DONE BY DR Vernie Ammons    PROSTATE BIOPSY     ROTATOR CUFF REPAIR Right 03-13-12; 09-27-15   TOTAL HIP ARTHROPLASTY Left 11/28/2018   Procedure: TOTAL HIP ARTHROPLASTY;  Surgeon: Frederico Hamman, MD;  Location: [facility];  Service: Orthopedics;  Laterality: Left;     Current Outpatient Medications  Medication Sig Dispense Refill   acetaminophen (TYLENOL) 650 MG CR tablet Take 650-1,300 mg by mouth every 8 (eight) hours as needed for pain.     amLODipine (NORVASC) 5 MG tablet Take 5 mg by mouth daily.     atorvastatin (LIPITOR) 10 MG tablet Take 10 mg by mouth daily.     donepezil (ARICEPT) 10 MG tablet Take 1 tablet (10 mg total) by mouth at bedtime. 90 tablet 2   losartan (COZAAR) 100 MG tablet Take 100 mg by  mouth daily.     metoprolol succinate (TOPROL-XL) 25 MG 24 hr tablet TAKE 1 TABLET BY MOUTH DAILY AS NEEDED FOR PALPITATIONS 30 tablet 11   pantoprazole (PROTONIX) 40 MG tablet Take 40 mg by mouth daily.     No current facility-administered medications for this visit.    Allergies:   Patient has no active allergies.    Social History:  The patient  reports that he quit smoking about 51 years ago. His smoking use included cigarettes. He has never used smokeless tobacco. He reports current alcohol use. He reports that he does not use drugs.   Family History:  The patient's ***family history includes Cancer in his sister; Healthy in his father and mother; Stroke in his sister.    ROS:  Please see the history of present illness.   Otherwise, review of systems are positive for ***.   All other systems  are reviewed and negative.    PHYSICAL EXAM: VS:  There were no vitals taken for this visit. , BMI There is no height or weight on file to calculate BMI. GEN: Well nourished, well developed, in no acute distress HEENT: normal Neck: no JVD, carotid bruits, or masses Cardiac: ***RRR; no murmurs, rubs, or gallops,no edema  Respiratory:  clear to auscultation bilaterally, normal work of breathing GI: soft, nontender, nondistended, + BS MS: no deformity or atrophy Skin: warm and dry, no rash Neuro:  Strength and sensation are intact Psych: euthymic mood, full affect   EKG:   The ekg ordered today demonstrates ***   Recent Labs: 05/03/2022: ALT 16; Hemoglobin 12.0; Platelets 157 08/02/2022: BUN 10; Creatinine, Ser 1.19; Potassium 3.8; Sodium 142   Lipid Panel No results found for: "CHOL", "TRIG", "HDL", "CHOLHDL", "VLDL", "LDLCALC", "LDLDIRECT"   Other studies Reviewed: Additional studies/ records that were reviewed today with results demonstrating: ***.   ASSESSMENT AND PLAN:  PAC/PVC:  Hyperlipidemia: HTN: Aortic atherosclerosis:   Current medicines are reviewed at length with the patient today.  The patient concerns regarding his medicines were addressed.  The following changes have been made:  No change***  Labs/ tests ordered today include: *** No orders of the defined types were placed in this encounter.   Recommend 150 minutes/week of aerobic exercise Low fat, low carb, high fiber diet recommended  Disposition:   FU in ***   Signed, Lance Muss, MD  01/22/2023 3:30 PM    [facility] [address] [place], [place], [place]  [address] Phone: [phone]; Fax: [phone]

## 2023-01-23 ENCOUNTER — Encounter: Payer: Self-pay | Admitting: Interventional Cardiology

## 2023-01-23 ENCOUNTER — Ambulatory Visit: Payer: Medicare PPO | Attending: Interventional Cardiology | Admitting: Interventional Cardiology

## 2023-01-23 DIAGNOSIS — I491 Atrial premature depolarization: Secondary | ICD-10-CM

## 2023-01-23 DIAGNOSIS — R001 Bradycardia, unspecified: Secondary | ICD-10-CM

## 2023-01-23 DIAGNOSIS — I493 Ventricular premature depolarization: Secondary | ICD-10-CM

## 2023-01-23 DIAGNOSIS — E782 Mixed hyperlipidemia: Secondary | ICD-10-CM

## 2023-01-23 DIAGNOSIS — I10 Essential (primary) hypertension: Secondary | ICD-10-CM

## 2023-04-04 NOTE — Progress Notes (Deleted)
Cardiology Clinic Note   Patient Name: Dennis Huffman Date of Encounter: 04/04/2023  Primary Care Provider:  Georgann Housekeeper, MD Primary Cardiologist:  Lance Muss, MD  Patient Profile    Dennis Huffman 72 year old male presents to the clinic today for follow-up evaluation of his PVCs and PACs.  Past Medical History    Past Medical History:  Diagnosis Date   Arthritis    cervical spondylotic, radiculopathy   Bradycardia    a. HR previously reported as 40 at PCP's office; event monitor 2017 showed nocturnal bradycardia but no daytime findings.   CKD (chronic kidney disease), stage II    Hypercholesteremia    Hypertension    Premature atrial contractions    Prostate cancer (HCC)    PVC's (premature ventricular contractions)    Past Surgical History:  Procedure Laterality Date   ANTERIOR CERVICAL DECOMP/DISCECTOMY FUSION N/A 05/12/2015   Procedure: ANTERIOR CERVICAL DECOMPRESSION/DISCECTOMY FUSION C3 - C6 3 LEVELS;  Surgeon: Venita Lick, MD;  Location: [facility];  Service: Orthopedics;  Laterality: N/A;   colonscopy     COLONSCOPY      INSERTION PROSTATE RADIATION SEED  UNSURE OF DATE   DONE BY DR Vernie Ammons    PROSTATE BIOPSY     ROTATOR CUFF REPAIR Right 03-13-12; 09-27-15   TOTAL HIP ARTHROPLASTY Left 11/28/2018   Procedure: TOTAL HIP ARTHROPLASTY;  Surgeon: Frederico Hamman, MD;  Location: [facility];  Service: Orthopedics;  Laterality: Left;    Allergies  No Active Allergies  History of Present Illness    Dennis Huffman has a PMH of PVCs, PACs, bradycardia, hyperlipidemia, hypertension, and CKD stage II.  He has a PMH of prostate cancer and radiation.  Testosterone was not recommended.  On 7/21 he had CTA of his aorta which showed greatest diameter of the ascending aorta of 37 mm.  He was also noted to have a small hiatal hernia and aortic atherosclerosis.  He reported some dizziness 1/22.  This was after trying a memory supplement he ordered on a TV ad.  He  stopped the medication and sent back to the company.  He was initially referred to cardiology by his PCP after he reported palpitations while taking hydrocodone in 204.  His palpitations stopped with medication discontinuation.  His EKG showed a heart rate in the 40s.  He wore a cardiac event monitor in 2017 which showed nocturnal bradycardia but no bradycardia during the daytime.  He was last seen by Dr.Varanasi on 01/10/2022.  During that time he denied chest pain, dizziness, lower extremity swelling, palpitations, PND and syncope.  He presents to the clinic today for follow-up evaluation and states***.  *** denies chest pain, shortness of breath, lower extremity edema, fatigue, palpitations, melena, hematuria, hemoptysis, diaphoresis, weakness, presyncope, syncope, orthopnea, and PND.  PACs, PVCs-denies palpitations.  His metoprolol was changed to as needed 2/22. Continue to avoid triggers caffeine, chocolate, EtOH, dehydration etc. Continue metoprolol as needed Maintain physical activity-goal 150 minutes of moderate physical activity per week.  Essential hypertension-BP today***. Maintain blood pressure log Heart healthy low-sodium diet  Hyperlipidemia-LDL***. Continue current medical therapy Follows with PCP  Aortic atherosclerosis-noted on chest CT. Continue current medical therapy Heart healthy low-sodium high-fiber diet  Disposition: Follow-up with me in 1 year.  Home Medications    Prior to Admission medications   Medication Sig Start Date End Date Taking? Authorizing Provider  acetaminophen (TYLENOL) 650 MG CR tablet Take 650-1,300 mg by mouth every 8 (eight) hours as needed for  pain.    [provider]  amLODipine (NORVASC) 5 MG tablet Take 5 mg by mouth daily.    [provider]  atorvastatin (LIPITOR) 10 MG tablet Take 10 mg by mouth daily.    [provider]  donepezil (ARICEPT) 10 MG tablet Take 1 tablet (10 mg total) by mouth at bedtime.  09/25/22   Sater, Pearletha Furl, MD  losartan (COZAAR) 100 MG tablet Take 100 mg by mouth daily.    [provider]  metoprolol succinate (TOPROL-XL) 25 MG 24 hr tablet TAKE 1 TABLET BY MOUTH DAILY AS NEEDED FOR PALPITATIONS 06/01/22   Corky Crafts, MD  pantoprazole (PROTONIX) 40 MG tablet Take 40 mg by mouth daily.    [provider]    Family History    Family History  Problem Relation Age of Onset   Healthy Mother    Healthy Father    Stroke Sister    Cancer Sister        breast   He indicated that his mother is alive. He indicated that his father is deceased. He indicated that his sister is alive.  Social History    Social History   Socioeconomic History   Marital status: Single    Spouse name: Not on file   Number of children: Not on file   Years of education: Not on file   Highest education level: Not on file  Occupational History   Not on file  Tobacco Use   Smoking status: Former    Current packs/day: 0.00    Types: Cigarettes    Quit date: 06/26/1971    Years since quitting: 51.8   Smokeless tobacco: Never  Vaping Use   Vaping status: Never Used  Substance and Sexual Activity   Alcohol use: Yes    Comment: socially    Drug use: No   Sexual activity: Yes  Other Topics Concern   Not on file  Social History Narrative   Right handed    Caffeine use: 1 cup coffee per day   Soda- not on a regular basis   Lives with mother, sister, brother, and nephew.   Social Determinants of Health   Financial Resource Strain: Not on file  Food Insecurity: Not on file  Transportation Needs: Not on file  Physical Activity: Not on file  Stress: Not on file  Social Connections: Not on file  Intimate Partner Violence: Not on file     Review of Systems    General:  No chills, fever, night sweats or weight changes.  Cardiovascular:  No chest pain, dyspnea on exertion, edema, orthopnea, palpitations, paroxysmal nocturnal dyspnea. Dermatological: No  rash, lesions/masses Respiratory: No cough, dyspnea Urologic: No hematuria, dysuria Abdominal:   No nausea, vomiting, diarrhea, bright red blood per rectum, melena, or hematemesis Neurologic:  No visual changes, wkns, changes in mental status. All other systems reviewed and are otherwise negative except as noted above.  Physical Exam    VS:  There were no vitals taken for this visit. , BMI There is no height or weight on file to calculate BMI. GEN: Well nourished, well developed, in no acute distress. HEENT: normal. Neck: Supple, no JVD, carotid bruits, or masses. Cardiac: RRR, no murmurs, rubs, or gallops. No clubbing, cyanosis, edema.  Radials/DP/PT 2+ and equal bilaterally.  Respiratory:  Respirations regular and unlabored, clear to auscultation bilaterally. GI: Soft, nontender, nondistended, BS + x 4. MS: no deformity or atrophy. Skin: warm and dry, no rash. Neuro:  Strength and sensation are intact. Psych: Normal affect.  Accessory Clinical Findings    Recent Labs: 05/03/2022: ALT 16; Hemoglobin 12.0; Platelets 157 08/02/2022: BUN 10; Creatinine, Ser 1.19; Potassium 3.8; Sodium 142   Recent Lipid Panel No results found for: "CHOL", "TRIG", "HDL", "CHOLHDL", "VLDL", "LDLCALC", "LDLDIRECT"  No BP recorded.  {Refresh Note OR Click here to enter BP  :1}***    ECG personally reviewed by me today- ***    Echocardiogram 07/15/2019  IMPRESSIONS     1. Left ventricular ejection fraction, by visual estimation, is 60 to  65%. The left ventricle has normal function. There is mildly increased  left ventricular hypertrophy.   2. The left ventricle has no regional wall motion abnormalities.   3. Global right ventricle has normal systolic function.The right  ventricular size is normal. No increase in right ventricular wall  thickness.   4. Left atrial size was mildly dilated.   5. Right atrial size was normal.   6. The mitral valve is normal in structure. Mild mitral valve   regurgitation. No evidence of mitral stenosis.   7. The tricuspid valve is normal in structure.   8. The tricuspid valve is normal in structure. Tricuspid valve  regurgitation is not demonstrated.   9. The aortic valve is tricuspid. Aortic valve regurgitation is not  visualized. Mild aortic valve sclerosis without stenosis.  10. The pulmonic valve was normal in structure. Pulmonic valve  regurgitation is not visualized.  11. Aortic dilatation noted.  12. There is mild dilatation of the ascending aorta measuring 42 mm.  13. The inferior vena cava is normal in size with greater than 50%  respiratory variability, suggesting right atrial pressure of 3 mmHg.   FINDINGS   Left Ventricle: Left ventricular ejection fraction, by visual estimation,  is 60 to 65%. The left ventricle has normal function. The left ventricle  has no regional wall motion abnormalities. There is mildly increased left  ventricular hypertrophy. Left  ventricular diastolic parameters were normal. Normal left atrial pressure.   Right Ventricle: The right ventricular size is normal. No increase in  right ventricular wall thickness. Global RV systolic function is has  normal systolic function.   Left Atrium: Left atrial size was mildly dilated.   Right Atrium: Right atrial size was normal in size   Pericardium: There is no evidence of pericardial effusion.   Mitral Valve: The mitral valve is normal in structure. There is mild  thickening of the mitral valve leaflet(s). Mild mitral valve  regurgitation. No evidence of mitral valve stenosis by observation.   Tricuspid Valve: The tricuspid valve is normal in structure. Tricuspid  valve regurgitation is not demonstrated.   Aortic Valve: The aortic valve is tricuspid. Aortic valve regurgitation is  not visualized. Mild aortic valve sclerosis is present, with no evidence  of aortic valve stenosis.   Pulmonic Valve: The pulmonic valve was normal in structure. Pulmonic  valve  regurgitation is not visualized. Pulmonic regurgitation is not visualized.   Aorta: The aortic root, ascending aorta and aortic arch are all  structurally normal, with no evidence of dilitation or obstruction and  aortic dilatation noted. There is mild dilatation of the ascending aorta  measuring 42 mm.   Venous: The inferior vena cava is normal in size with greater than 50%  respiratory variability, suggesting right atrial pressure of 3 mmHg.   IAS/Shunts: No atrial level shunt detected by color flow Doppler. There is  no evidence of a patent foramen ovale. No  ventricular septal defect is  seen or detected. There is no evidence of an atrial septal defect.   CT angio chest aorta 01/18/2020   FINDINGS: Cardiovascular:   Heart:   No cardiomegaly. No pericardial fluid/thickening. No significant coronary calcifications.   Aorta:   No significant aortic valve calcifications. Greatest diameter of the ascending aorta is estimated 37 mm on the axial images. Non gated CT.   Greatest diameter of the annulus on the coronal images, 25 mm. Greatest diameter estimated at the sino-tubular junction 34 mm.   Three vessel arch with patency of the branch vessels. Minimal atherosclerotic changes of the thoracic aorta. No dissection. No periaortic fluid.   Pulmonary arteries:   Timing of the contrast bolus not optimized for evaluation of pulmonary arteries, however, no proximal filling defects are identified.   Mediastinum/Nodes: No mediastinal adenopathy. Unremarkable thoracic esophagus, with small hiatal hernia.   Unremarkable appearance of the thoracic inlet and thyroid.   Lungs/Pleura: Central airways are clear. No pleural effusion. No confluent airspace disease.   No pneumothorax.   Upper Abdomen: No acute.   Musculoskeletal: Degenerative changes of the spine. No acute displaced fracture   Review of the MIP images confirms the above findings.    IMPRESSION: Greatest diameter of the ascending aorta on this non gated CT chest is estimated 37 mm. No comparison cross-sectional imaging available.   Small hiatal hernia   Minimal atherosclerosis of the thoracic aorta. Aortic Atherosclerosis (ICD10-I70.0).   Signed,   Yvone Neu. Reyne Dumas, RPVI       Assessment & Plan   1.  ***   Thomasene Ripple. Kaylana Fenstermacher NP-C     04/04/2023, 7:24 AM [facility] [address] Office [phone] Fax [phone]    I spent***minutes examining this patient, reviewing medications, and using patient centered shared decision making involving her cardiac care.  Prior to her visit I spent greater than 20 minutes reviewing her past medical history,  medications, and prior cardiac tests.

## 2023-04-08 ENCOUNTER — Ambulatory Visit: Payer: Medicare Other | Attending: General Practice | Admitting: General Practice

## 2023-08-28 DIAGNOSIS — I700 Atherosclerosis of aorta: Secondary | ICD-10-CM | POA: Diagnosis not present

## 2023-08-28 DIAGNOSIS — I129 Hypertensive chronic kidney disease with stage 1 through stage 4 chronic kidney disease, or unspecified chronic kidney disease: Secondary | ICD-10-CM | POA: Diagnosis not present

## 2023-08-28 DIAGNOSIS — N1831 Chronic kidney disease, stage 3a: Secondary | ICD-10-CM | POA: Diagnosis not present

## 2023-08-28 DIAGNOSIS — I10 Essential (primary) hypertension: Secondary | ICD-10-CM | POA: Diagnosis not present

## 2023-09-10 ENCOUNTER — Encounter: Payer: Self-pay | Admitting: Neurology

## 2023-09-10 ENCOUNTER — Ambulatory Visit: Admitting: Neurology

## 2023-09-10 VITALS — BP 169/100 | HR 69 | Ht 71.0 in | Wt 215.5 lb

## 2023-09-10 DIAGNOSIS — G301 Alzheimer's disease with late onset: Secondary | ICD-10-CM

## 2023-09-10 DIAGNOSIS — F02B18 Dementia in other diseases classified elsewhere, moderate, with other behavioral disturbance: Secondary | ICD-10-CM | POA: Diagnosis not present

## 2023-09-10 MED ORDER — MEMANTINE HCL 5 MG PO TABS: 5.0000 mg | ORAL_TABLET | Freq: Two times a day (BID) | ORAL | 0 refills | Status: DC

## 2023-09-10 MED ORDER — MEMANTINE HCL 10 MG PO TABS: 10.0000 mg | ORAL_TABLET | Freq: Two times a day (BID) | ORAL | 11 refills | Status: AC

## 2023-09-10 NOTE — Progress Notes (Signed)
 [facility]  PATIENT: Dennis Huffman DOB: 25-Oct-1950  REFERRING DOCTOR OR PCP:  Dr. Donette Larry SOURCE: Patient, Notes from Dr. Donette Larry, laboratory reports,  _________________________________   HISTORICAL  CHIEF COMPLAINT:  Chief Complaint  Patient presents with   New Patient (Initial Visit)    Rm10, male best friend present, referral req f/u on Dementia/Dr. Marney Setting at [facility] [phone]:moca score of 10    HISTORY OF PRESENT ILLNESS: Dennis Huffman is a 73 y.o. man with memory loss.  Update  09/10/2023 Since the last visit he has had testing including the amyloid beta 42/40 ratio and pTau181.  The ratio was reduced and the pTau181 was elevated.  This pattern is consistent with Alzheimer's disease.  He is on donepezil 10 mg daily.  He tolerates it well.  Memory issues began in 2020 and have progressed.  The onset was gradual over this time.  I first saw him in 2020 and he had mild cognitive impairment.  When last seen in 2024, he had dementia with the [facility] cognitive assessment score of 15.  Today his score is 10.  A typical day he watches TV.  His friend takes him out of the house to walk.  He needs reminders for bathing, change clothes and sometimes eating.  He denies much depression but does note mild apathy compared to earlier this year.[name]  He tries to get outdoors some but is not going to the gym as he used to.     He served in [place] and denies any PTSD symptoms.      He is sleeping more poorly.   He typically goes to bed at 11 pm but wakes up around 2 am and then has trouble getting back to sleep.  Melatonin has helped some.   He denies being told that he snores.      He will sometimes get agitated if others want him to do something he prefers not to do.  He is easy to calm back down.   He likes people he knows to be near him.  He no longer drives.     He had thyroid tests and B12 tests when symptoms started..  B12 was 317 and TSH  1.07.  He has a normal gait for age.  Strength is normal.  He notes some tingling in the left arm but no numbness in feet.      He has a h/o of prostate cancer, treated with radiation.     He has a 9th grade education.      09/10/2023   12:55 PM 07/10/2022   11:00 AM 09/15/2020   10:56 AM 09/30/2019    9:33 AM 06/01/2019    2:33 PM  Montreal Cognitive Assessment   Visuospatial/ Executive (0/5) 1 2 3 5 4   Naming (0/3) 3 3 3 2 3   Attention: Read list of digits (0/2) 1 1 2 2 2   Attention: Read list of letters (0/1) 1 1 1 1 1   Attention: Serial 7 subtraction starting at 100 (0/3) 0 0 0 1 3  Language: Repeat phrase (0/2) 1 2 2 2 2   Language : Fluency (0/1) 0 1 0 1 0  Abstraction (0/2) 1 1 2 2  0  Delayed Recall (0/5) 0 0 0 0 0  Orientation (0/6) 2 4 6 5 5   Total 10 15 19 21 20   Adjusted Score (based on education)  16  22 21      Imaging:  MRI of the brain 06/29/2019 was  normal for age.  Specifically, brain volume was normal for age and just minimal chronic microvascular ischemic change.  REVIEW OF SYSTEMS: Constitutional: No fevers, chills, sweats, or change in appetite Eyes: No visual changes, double vision, eye pain Ear, nose and throat: No hearing loss, ear pain, nasal congestion, sore throat Cardiovascular: No chest pain, palpitations Respiratory:  No shortness of breath at rest or with exertion.   No wheezes GastrointestinaI: No nausea, vomiting, diarrhea, abdominal pain, fecal incontinence Genitourinary:  No dysuria, urinary retention or frequency.  No nocturia. Musculoskeletal:  No neck pain, back pain Integumentary: No rash, pruritus, skin lesions Neurological: as above Psychiatric: No depression at this time.  No anxiety Endocrine: No palpitations, diaphoresis, change in appetite, change in weigh or increased thirst Hematologic/Lymphatic:  No anemia, purpura, petechiae. Allergic/Immunologic: No itchy/runny eyes, nasal congestion, recent allergic reactions,  rashes  ALLERGIES: No Active Allergies   HOME MEDICATIONS:  Current Outpatient Medications:    acetaminophen (TYLENOL) 650 MG CR tablet, Take 650-1,300 mg by mouth every 8 (eight) hours as needed for pain., Disp: , Rfl:    amLODipine (NORVASC) 5 MG tablet, Take 5 mg by mouth daily., Disp: , Rfl:    atorvastatin (LIPITOR) 10 MG tablet, Take 10 mg by mouth daily., Disp: , Rfl:    donepezil (ARICEPT) 10 MG tablet, Take 1 tablet (10 mg total) by mouth at bedtime., Disp: 90 tablet, Rfl: 2   losartan (COZAAR) 100 MG tablet, Take 100 mg by mouth daily., Disp: , Rfl:    memantine (NAMENDA) 10 MG tablet, Take 1 tablet (10 mg total) by mouth 2 (two) times daily. Begin this prescription after the 5 mg is complete, Disp: 60 tablet, Rfl: 11   metoprolol succinate (TOPROL-XL) 50 MG 24 hr tablet, Take 50 mg by mouth daily. Take with or immediately following a meal., Disp: , Rfl:    pantoprazole (PROTONIX) 40 MG tablet, Take 40 mg by mouth daily., Disp: , Rfl:    memantine (NAMENDA) 5 MG tablet, Take 1 tablet (5 mg total) by mouth 2 (two) times daily. Take one pill once a day for 2 weeks then take one pill twice a day for 2 weeks, Disp: 45 tablet, Rfl: 0  PAST MEDICAL HISTORY: Past Medical History:  Diagnosis Date   Arthritis    cervical spondylotic, radiculopathy   Bradycardia    a. HR previously reported as 40 at PCP's office; event monitor 2017 showed nocturnal bradycardia but no daytime findings.   CKD (chronic kidney disease), stage II    Hypercholesteremia    Hypertension    Premature atrial contractions    Prostate cancer (HCC)    PVC's (premature ventricular contractions)     PAST SURGICAL HISTORY: Past Surgical History:  Procedure Laterality Date   ANTERIOR CERVICAL DECOMP/DISCECTOMY FUSION N/A 05/12/2015   Procedure: ANTERIOR CERVICAL DECOMPRESSION/DISCECTOMY FUSION C3 - C6 3 LEVELS;  Surgeon: Venita Lick, MD;  Location: [facility] OR;  Service: Orthopedics;  Laterality: N/A;   colonscopy      COLONSCOPY      INSERTION PROSTATE RADIATION SEED  UNSURE OF DATE   DONE BY DR Vernie Ammons    PROSTATE BIOPSY     ROTATOR CUFF REPAIR Right 03-13-12; 09-27-15   TOTAL HIP ARTHROPLASTY Left 11/28/2018   Procedure: TOTAL HIP ARTHROPLASTY;  Surgeon: Frederico Hamman, MD;  Location: [facility] ORS;  Service: Orthopedics;  Laterality: Left;    FAMILY HISTORY: Family History  Problem Relation Age of Onset   Healthy Mother    Healthy Father  Stroke Sister    Cancer Sister        breast    SOCIAL HISTORY:  Social History   Socioeconomic History   Marital status: Single    Spouse name: Not on file   Number of children: Not on file   Years of education: Not on file   Highest education level: Not on file  Occupational History   Not on file  Tobacco Use   Smoking status: Former    Current packs/day: 0.00    Types: Cigarettes    Quit date: 06/26/1971    Years since quitting: 52.2   Smokeless tobacco: Never  Vaping Use   Vaping status: Never Used  Substance and Sexual Activity   Alcohol use: Yes    Comment: socially    Drug use: No   Sexual activity: Yes  Other Topics Concern   Not on file  Social History Narrative   Right handed    Caffeine use: 1 cup coffee per day   Soda- not on a regular basis   Lives with mother, sister, brother, and nephew.   Social Drivers of Corporate investment banker Strain: Not on file  Food Insecurity: Not on file  Transportation Needs: Not on file  Physical Activity: Not on file  Stress: Not on file  Social Connections: Not on file  Intimate Partner Violence: Not on file     PHYSICAL EXAM  Vitals:   09/10/23 1249  BP: (!) 169/100  Pulse: 69  Weight: 215 lb 8 oz (97.8 kg)  Height: 5\' 11"  (1.803 m)     Body mass index is 30.06 kg/m.   General: The patient is well-developed and well-nourished and in no acute distress  HEENT:  Head is [place]/AT.  Sclera are anicteric.    Skin: Extremities are without rash or  edema.  Musculoskeletal:   Back is nontender  Neurologic Exam  Mental status: The patient is alert and oriented x 2 at the time of the examination. The patient has reduced recent memory, with reduced attention span   Speech is normal.   MoCA above  Cranial nerves: Extraocular movements are full.  Facial strength and sensation was normal.  No dysarthria.. No obvious hearing deficits are noted.  Motor:  Muscle bulk is normal.   Tone is normal. Strength is  5 / 5 in all 4 extremities.   Sensory: Sensory testing is intact to pinprick, soft touch and vibration sensation in all 4 extremities.  Coordination: Cerebellar testing reveals good finger-nose-finger and heel-to-shin bilaterally.  Gait and station: Station is normal.   Gait is normal. Tandem gait is normal. Romberg is negative.   Reflexes: Deep tendon reflexes are symmetric and normal bilaterally.       DIAGNOSTIC DATA (LABS, IMAGING, TESTING) - I reviewed patient records, labs, notes, testing and imaging myself where available.  Lab Results  Component Value Date   WBC 3.7 (L) 05/03/2022   HGB 12.0 (L) 05/03/2022   HCT 34.2 (L) 05/03/2022   MCV 80.5 05/03/2022   PLT 157 05/03/2022       ASSESSMENT AND PLAN  Moderate late onset Alzheimer's dementia with other behavioral disturbance (HCC)    Biomarkers c/w AD.  In moderate range.  Continue donepezil Add memantine titrate to 10 mg po bid If agitation worsens, add hydroxyzine 10 mg prn Rtc prn  This visit is part of a comprehensive longitudinal care medical relationship regarding the patients primary diagnosis of AD and related concerns.   Derrin Currey  Aida Puffer, MD, [facility] 09/10/2023, 1:31 PM Certified in Neurology, Clinical Neurophysiology, Sleep Medicine and Neuroimaging  [facility] [address] [place], [place] [address] [phone]

## 2023-09-10 NOTE — Patient Instructions (Signed)
 For two weeks take 5 mg once a day Then for 2 weeks,  take 5 mg twice a day  Then take 10 mg twice a day   The pharmacy has one prescription for 5 mg and one prescription for 10 mg that has refills

## 2023-11-20 ENCOUNTER — Ambulatory Visit: Admitting: Neurology

## 2023-12-11 DIAGNOSIS — I10 Essential (primary) hypertension: Secondary | ICD-10-CM | POA: Diagnosis not present

## 2023-12-11 DIAGNOSIS — N1831 Chronic kidney disease, stage 3a: Secondary | ICD-10-CM | POA: Diagnosis not present

## 2023-12-11 DIAGNOSIS — E7800 Pure hypercholesterolemia, unspecified: Secondary | ICD-10-CM | POA: Diagnosis not present

## 2023-12-11 DIAGNOSIS — G301 Alzheimer's disease with late onset: Secondary | ICD-10-CM | POA: Diagnosis not present

## 2024-01-25 ENCOUNTER — Emergency Department (HOSPITAL_COMMUNITY)
Admission: EM | Admit: 2024-01-25 | Discharge: 2024-01-25 | Disposition: A | Discharge status: Home / Self Care | Attending: Emergency Medicine | Admitting: Emergency Medicine

## 2024-01-25 ENCOUNTER — Emergency Department (HOSPITAL_COMMUNITY)

## 2024-01-25 ENCOUNTER — Encounter (HOSPITAL_COMMUNITY): Payer: Self-pay

## 2024-01-25 ENCOUNTER — Other Ambulatory Visit: Payer: Self-pay

## 2024-01-25 DIAGNOSIS — R55 Syncope and collapse: Secondary | ICD-10-CM | POA: Diagnosis present

## 2024-01-25 DIAGNOSIS — R001 Bradycardia, unspecified: Secondary | ICD-10-CM | POA: Insufficient documentation

## 2024-01-25 DIAGNOSIS — N179 Acute kidney failure, unspecified: Secondary | ICD-10-CM | POA: Insufficient documentation

## 2024-01-25 DIAGNOSIS — I129 Hypertensive chronic kidney disease with stage 1 through stage 4 chronic kidney disease, or unspecified chronic kidney disease: Secondary | ICD-10-CM | POA: Diagnosis not present

## 2024-01-25 DIAGNOSIS — W19XXXA Unspecified fall, initial encounter: Secondary | ICD-10-CM | POA: Insufficient documentation

## 2024-01-25 DIAGNOSIS — Z8546 Personal history of malignant neoplasm of prostate: Secondary | ICD-10-CM | POA: Insufficient documentation

## 2024-01-25 DIAGNOSIS — F0390 Unspecified dementia without behavioral disturbance: Secondary | ICD-10-CM | POA: Diagnosis not present

## 2024-01-25 DIAGNOSIS — N189 Chronic kidney disease, unspecified: Secondary | ICD-10-CM | POA: Insufficient documentation

## 2024-01-25 DIAGNOSIS — E860 Dehydration: Secondary | ICD-10-CM | POA: Insufficient documentation

## 2024-01-25 LAB — CBC
HCT: 37.3 % — ABNORMAL LOW (ref 39.0–52.0)
Hemoglobin: 13 g/dL (ref 13.0–17.0)
MCH: 28 pg (ref 26.0–34.0)
MCHC: 34.9 g/dL (ref 30.0–36.0)
MCV: 80.4 fL (ref 80.0–100.0)
Platelets: 182 K/uL (ref 150–400)
RBC: 4.64 MIL/uL (ref 4.22–5.81)
RDW: 14.3 % (ref 11.5–15.5)
WBC: 7 K/uL (ref 4.0–10.5)
nRBC: 0 % (ref 0.0–0.2)

## 2024-01-25 LAB — MAGNESIUM: Magnesium: 1.8 mg/dL (ref 1.7–2.4)

## 2024-01-25 LAB — COMPREHENSIVE METABOLIC PANEL WITH GFR
ALT: 21 U/L (ref 0–44)
AST: 22 U/L (ref 15–41)
Albumin: 4 g/dL (ref 3.5–5.0)
Alkaline Phosphatase: 49 U/L (ref 38–126)
Anion gap: 12 (ref 5–15)
BUN: 16 mg/dL (ref 8–23)
CO2: 23 mmol/L (ref 22–32)
Calcium: 9.6 mg/dL (ref 8.9–10.3)
Chloride: 105 mmol/L (ref 98–111)
Creatinine, Ser: 1.7 mg/dL — ABNORMAL HIGH (ref 0.61–1.24)
GFR, Estimated: 42 mL/min — ABNORMAL LOW (ref >60)
Glucose, Bld: 157 mg/dL — ABNORMAL HIGH (ref 70–99)
Potassium: 3.5 mmol/L (ref 3.5–5.1)
Sodium: 140 mmol/L (ref 135–145)
Total Bilirubin: 1.5 mg/dL — ABNORMAL HIGH (ref 0.0–1.2)
Total Protein: 7 g/dL (ref 6.5–8.1)

## 2024-01-25 LAB — LACTIC ACID, PLASMA: Lactic Acid, Venous: 1.5 mmol/L (ref 0.5–1.9)

## 2024-01-25 LAB — LIPASE, BLOOD: Lipase: 30 U/L (ref 11–51)

## 2024-01-25 LAB — TSH: TSH: 1.074 u[IU]/mL (ref 0.350–4.500)

## 2024-01-25 LAB — CBG MONITORING, ED: Glucose-Capillary: 175 mg/dL — ABNORMAL HIGH (ref 70–99)

## 2024-01-25 LAB — TROPONIN I (HIGH SENSITIVITY): Troponin I (High Sensitivity): 5 ng/L (ref <18)

## 2024-01-25 NOTE — ED Provider Notes (Signed)
 [place] EMERGENCY DEPARTMENT AT [facility] Provider Note   CSN: [id number] Arrival date & time: 01/25/24  1005     Patient presents with: Loss of Consciousness   Dennis Huffman is a 73 y.o. male.   The history is provided by the patient and medical records. No language interpreter was used.  Loss of Consciousness Episode history:  Single Most recent episode:  Today Timing:  Unable to specify Progression:  Unable to specify Chronicity:  New Context comment:  In bathroom Witnessed: no   Relieved by:  Nothing Worsened by:  Nothing Ineffective treatments:  None tried Associated symptoms: vomiting   Associated symptoms: no chest pain, no confusion, no fever, no headaches, no nausea, no palpitations, no shortness of breath and no weakness   Associated symptoms comment:  Bradycardia      Prior to Admission medications   Medication Sig Start Date End Date Taking? Authorizing Provider  acetaminophen  (TYLENOL ) 650 MG CR tablet Take 650-1,300 mg by mouth every 8 (eight) hours as needed for pain.    [provider]  amLODipine  (NORVASC ) 5 MG tablet Take 5 mg by mouth daily.    [provider]  atorvastatin  (LIPITOR) 10 MG tablet Take 10 mg by mouth daily.    [provider]  donepezil  (ARICEPT ) 10 MG tablet Take 1 tablet (10 mg total) by mouth at bedtime. 09/25/22   Sater, Charlie LABOR, MD  losartan  (COZAAR ) 100 MG tablet Take 100 mg by mouth daily.    [provider]  memantine  (NAMENDA ) 10 MG tablet Take 1 tablet (10 mg total) by mouth 2 (two) times daily. Begin this prescription after the 5 mg is complete 09/10/23   Sater, Charlie LABOR, MD  memantine  (NAMENDA ) 5 MG tablet Take 1 tablet (5 mg total) by mouth 2 (two) times daily. Take one pill once a day for 2 weeks then take one pill twice a day for 2 weeks 09/10/23   Sater, Charlie LABOR, MD  metoprolol  succinate (TOPROL -XL) 50 MG 24 hr tablet Take 50 mg by mouth daily. Take with or immediately  following a meal.    [provider]  pantoprazole (PROTONIX) 40 MG tablet Take 40 mg by mouth daily.    [provider]    Allergies: Patient has no active allergies.    Review of Systems  Constitutional:  Negative for chills, fatigue and fever.  HENT:  Negative for congestion.   Eyes:  Negative for visual disturbance.  Respiratory:  Negative for cough, chest tightness, shortness of breath and wheezing.   Cardiovascular:  Positive for syncope. Negative for chest pain and palpitations.  Gastrointestinal:  Positive for vomiting. Negative for abdominal pain, constipation, diarrhea and nausea.  Genitourinary:  Negative for dysuria and flank pain.  Musculoskeletal:  Negative for back pain, neck pain and neck stiffness.  Skin:  Negative for rash and wound.  Neurological:  Positive for syncope. Negative for weakness, light-headedness, numbness and headaches.  Psychiatric/Behavioral:  Negative for agitation and confusion.   All other systems reviewed and are negative.   Updated Vital Signs BP 117/66 (BP Location: Right Arm)   Pulse (!) 45   Temp 98.2 F (36.8 C) (Oral)   Resp 16   Ht 5' 11 (1.803 m)   SpO2 100%   BMI 30.06 kg/m   Physical Exam Vitals and nursing note reviewed.  Constitutional:      General: He is not in acute distress.    Appearance: He is well-developed. He  is not ill-appearing, toxic-appearing or diaphoretic.  HENT:     Head: Normocephalic and atraumatic.     Nose: No congestion or rhinorrhea.     Mouth/Throat:     Mouth: Mucous membranes are moist.     Pharynx: No oropharyngeal exudate or posterior oropharyngeal erythema.  Eyes:     Extraocular Movements: Extraocular movements intact.     Conjunctiva/sclera: Conjunctivae normal.     Pupils: Pupils are equal, round, and reactive to light.  Cardiovascular:     Rate and Rhythm: Regular rhythm. Bradycardia present.     Heart sounds: No murmur heard. Pulmonary:     Effort: Pulmonary effort  is normal. No respiratory distress.     Breath sounds: Normal breath sounds. No wheezing, rhonchi or rales.  Chest:     Chest wall: No tenderness.  Abdominal:     General: Abdomen is flat.     Palpations: Abdomen is soft.     Tenderness: There is no abdominal tenderness. There is no guarding or rebound.  Musculoskeletal:        General: No swelling or tenderness.     Cervical back: Neck supple. No tenderness.     Right lower leg: No edema.     Left lower leg: No edema.  Skin:    General: Skin is warm and dry.     Capillary Refill: Capillary refill takes less than 2 seconds.     Findings: No erythema or rash.  Neurological:     General: No focal deficit present.     Mental Status: He is alert.     Sensory: No sensory deficit.     Motor: No weakness.  Psychiatric:        Mood and Affect: Mood normal.     (all labs ordered are listed, but only abnormal results are displayed) Labs Reviewed  COMPREHENSIVE METABOLIC PANEL WITH GFR - Abnormal; Notable for the following components:      Result Value   Glucose, Bld 157 (*)    Creatinine, Ser 1.70 (*)    Total Bilirubin 1.5 (*)    GFR, Estimated 42 (*)    All other components within normal limits  CBC - Abnormal; Notable for the following components:   HCT 37.3 (*)    All other components within normal limits  CBG MONITORING, ED - Abnormal; Notable for the following components:   Glucose-Capillary 175 (*)    All other components within normal limits  MAGNESIUM  LIPASE, BLOOD  URINALYSIS, ROUTINE W REFLEX MICROSCOPIC  TSH  LACTIC ACID, PLASMA  LACTIC ACID, PLASMA  TROPONIN I (HIGH SENSITIVITY)  TROPONIN I (HIGH SENSITIVITY)    EKG: EKG Interpretation Date/Time:  Saturday January 25 2024 10:37:00 EDT Ventricular Rate:  41 PR Interval:  181 QRS Duration:  109 QT Interval:  489 QTC Calculation: 404 R Axis:   14  Text Interpretation: Sinus bradycardia when compared to prior, slower rate No STEMI Confirmed by Ginger Barefoot ([id number]) on 01/25/2024 11:40:03 AM  Radiology: CT Head Wo Contrast Result Date: 01/25/2024 CLINICAL DATA:  73 year old male status post unwitnessed syncope, found down. Vomited. EXAM: CT HEAD WITHOUT CONTRAST TECHNIQUE: Contiguous axial images were obtained from the base of the skull through the vertex without intravenous contrast. RADIATION DOSE REDUCTION: This exam was performed according to the departmental dose-optimization program which includes automated exposure control, adjustment of the mA and/or kV according to patient size and/or use of iterative reconstruction technique. COMPARISON:  Brain MRI 06/29/2019.  Head CT 07/03/2020.  FINDINGS: Brain: Disproportionate frontal lobe atrophy appears new or substantially progressed from prior exams (series 2, image 16). Ex vacuo enlargement of the frontal horns. And on sagittal and coronal images at least some associated anterior temporal lobe atrophy also (coronal image 49). No superimposed No midline shift, ventriculomegaly, mass effect, evidence of mass lesion, intracranial hemorrhage or evidence of cortically based acute infarction. Vascular: Calcified atherosclerosis at the skull base. No suspicious intracranial vascular hyperdensity. Skull: Chronic bilateral lamina papyracea fractures. Otherwise stable and intact. Sinuses/Orbits: Visualized paranasal sinuses and mastoids are stable and well aerated. Other: No acute orbit or scalp soft tissue injury identified. Chronic dystrophic scalp soft tissue calcification especially at the vertex. IMPRESSION: 1. No acute intracranial abnormality or acute traumatic injury identified. 2. Disproportionate frontal > temporal lobe atrophy is new or substantially progressed from prior exams, suspicious for neurodegenerative disease including frontotemporal lobar degeneration. Cerebral Atrophy (ICD10-G31.9). Electronically Signed   By: VEAR Hurst M.D.   On: 01/25/2024 12:16   DG Chest 2 View Result Date: 01/25/2024 CLINICAL  DATA:  Syncope with bradycardia and vomiting. EXAM: CHEST - 2 VIEW COMPARISON:  01/01/2020 FINDINGS: Lungs are adequately inflated and otherwise clear. Cardiomediastinal silhouette and remainder of the exam is unchanged. IMPRESSION: No active cardiopulmonary disease. Electronically Signed   By: Toribio Agreste M.D.   On: 01/25/2024 12:03     Procedures   Medications Ordered in the ED - No data to display                                  Medical Decision Making Amount and/or Complexity of Data Reviewed Labs: ordered. Radiology: ordered.    Dennis Huffman is a 73 y.o. male with a past medical history significant for hypertension, hyperlipidemia, previous bradycardia, CKD, hypercholesterolemia, report of dementia, previous prostate cancer who presents for syncope fall and vomiting.  According to family report, he has not been eating or drinking as much in the last few days and overnight he had a fall in the bathroom.  They heard some crashing and a thump and patient was found laying in the bathtub on his back covered in vomit on his face.  He was slightly confused and then started to improve his mental status.  They are unsure if he lost consciousness.  Patient tells me now that he did not fall and did not vomit although family assures him he did.  He does not member what happened.  There was no reported history of seizures.  Patient is feeling well now and completely denies any symptoms.  Heart rate is going into the 30s and what appears to be a sinus bradycardia on arrival.  Patient denying fevers, chills, chest pain, shortness breath, nausea, constipation, diarrhea, urinary changes.  No recent medication changes other than family reporting one of his blood pressure medicines was recently decreased.  On my exam, lungs were clear.  Chest nontender.  Abdomen nontender.  Patient moving all extremities with vigor.  Intact sensation throughout.  Symmetric smile.  Clear speech.  Pupil symmetric and  reactive normal extract movements.  No murmur.  Patient was bradycardic into the about 36 range when I was in there.  EKG shows sinus bradycardia with no STEMI.  Clinically I am unsure if  his syncope and fall and soft pressures are related to the bradycardia or not however we will get workup to look for traumatic injury from a head CT standpoint,  will get chest x-ray, will get labs.  Anticipate reassessment after workup to determine disposition       2:18 PM Workup continues to return.  Patient does have evidence of some degree of AKI with a creatinine of 1.7 up from 1.1 earlier this year however family thinks he is just dehydrated.  CBC reassuring with no anemia and no cytosis.  Magnesium normal.  Lipase normal.  CT head did not show evidence of acute cranial hemorrhage or bleed and no evidence of acute trauma.  Chest x-ray did not show acute abnormality today.  We had a shared decision conversation and they would like to go home.  They do not want to wait for urine or repeat lab testing.  They feel patient can take his home medications and rest and stay hydrated and be careful getting up too quickly.  He is still somewhat bradycardic but family reports this is a chronic problem for him and his heart rate has been documented to the 40s years ago.  Patient agrees with close PCP follow-up and strict return precautions for any new or worsening symptoms.  Will discharge for outpatient follow-up for lightheaded and syncope in the setting of some dehydration, AKI, and chronic bradycardia.      Final diagnoses:  Syncope, unspecified syncope type  Fall, initial encounter  Dehydration  AKI (acute kidney injury) (HCC)  Bradycardia    ED Discharge Orders     None       Clinical Impression: 1. Syncope, unspecified syncope type   2. Fall, initial encounter   3. Dehydration   4. AKI (acute kidney injury) (HCC)   5. Bradycardia     Disposition: Discharge  Condition: Good  I have  discussed the results, Dx and Tx plan with the pt(& family if present). He/she/they expressed understanding and agree(s) with the plan. Discharge instructions discussed at great length. Strict return precautions discussed and pt &/or family have verbalized understanding of the instructions. No further questions at time of discharge.    New Prescriptions   No medications on file    Follow Up: Husain, Karrar, MD [address] [place] [address] [phone]         Marios Gaiser, Lonni PARAS, MD 01/25/24 [phone]

## 2024-01-25 NOTE — Discharge Instructions (Addendum)
 Your history, exam and workup today is consistent with dehydration and bradycardia leading to a syncopal episode in your bathroom today.  Your CT head did not show intracranial hemorrhage or bleed or fracture and the rest of your workup did show evidence of dehydration and kidney injury.  We offered to wait for repeat labs and urinalysis but given your lack of urinary symptoms she wanted to go home.  It appears over the years you have had slow heart rate so please be careful with with standing up quickly and getting lightheaded.  Please call and follow-up with your primary doctor to discuss blood pressure medication titration and please make sure you are eating and drinking enough.  We offered admission but given your well appearance and stability for over 4 hours we feel is reasonable to talk about going home given her lack of any other symptoms.  Please follow-up and if any symptoms change or worsen acutely, please return to the nearest emergency department.

## 2024-01-25 NOTE — ED Triage Notes (Signed)
 Pt here for unwitnessed syncope event. Pt was alert when wife found him, pt had vomit in eyes. Vomit started after fall. No obvious injuries to head or neck. Hx of dementia. Axox3. VSS. EMS gave 4mg  of zofran  and 200cc of LR. Bradycardic in 40's.

## 2024-01-25 NOTE — ED Notes (Signed)
 2nd lactic due @1520 

## 2024-01-25 NOTE — ED Notes (Signed)
 Pt family verbalized understanding of discharge instructions. Pt removed blood pressure cuff. Pt refused discharge vitals

## 2024-02-13 ENCOUNTER — Emergency Department (HOSPITAL_COMMUNITY)

## 2024-02-13 ENCOUNTER — Inpatient Hospital Stay (HOSPITAL_COMMUNITY)
Admission: EM | Admit: 2024-02-13 | Discharge: 2024-02-15 | DRG: 308 | Disposition: A | Discharge status: Home / Self Care | Attending: Internal Medicine | Admitting: Internal Medicine

## 2024-02-13 ENCOUNTER — Encounter (HOSPITAL_COMMUNITY): Payer: Self-pay

## 2024-02-13 ENCOUNTER — Other Ambulatory Visit: Payer: Self-pay

## 2024-02-13 ENCOUNTER — Inpatient Hospital Stay (HOSPITAL_COMMUNITY)

## 2024-02-13 DIAGNOSIS — Z87891 Personal history of nicotine dependence: Secondary | ICD-10-CM

## 2024-02-13 DIAGNOSIS — I129 Hypertensive chronic kidney disease with stage 1 through stage 4 chronic kidney disease, or unspecified chronic kidney disease: Secondary | ICD-10-CM | POA: Diagnosis present

## 2024-02-13 DIAGNOSIS — Z803 Family history of malignant neoplasm of breast: Secondary | ICD-10-CM

## 2024-02-13 DIAGNOSIS — D72819 Decreased white blood cell count, unspecified: Secondary | ICD-10-CM | POA: Diagnosis present

## 2024-02-13 DIAGNOSIS — R001 Bradycardia, unspecified: Principal | ICD-10-CM | POA: Diagnosis present

## 2024-02-13 DIAGNOSIS — I10 Essential (primary) hypertension: Secondary | ICD-10-CM

## 2024-02-13 DIAGNOSIS — D631 Anemia in chronic kidney disease: Secondary | ICD-10-CM | POA: Diagnosis present

## 2024-02-13 DIAGNOSIS — Z823 Family history of stroke: Secondary | ICD-10-CM

## 2024-02-13 DIAGNOSIS — E7800 Pure hypercholesterolemia, unspecified: Secondary | ICD-10-CM | POA: Diagnosis present

## 2024-02-13 DIAGNOSIS — N1831 Chronic kidney disease, stage 3a: Secondary | ICD-10-CM | POA: Diagnosis present

## 2024-02-13 DIAGNOSIS — Z96642 Presence of left artificial hip joint: Secondary | ICD-10-CM | POA: Diagnosis present

## 2024-02-13 DIAGNOSIS — E785 Hyperlipidemia, unspecified: Secondary | ICD-10-CM | POA: Diagnosis not present

## 2024-02-13 DIAGNOSIS — F0393 Unspecified dementia, unspecified severity, with mood disturbance: Secondary | ICD-10-CM | POA: Diagnosis present

## 2024-02-13 DIAGNOSIS — J690 Pneumonitis due to inhalation of food and vomit: Secondary | ICD-10-CM | POA: Diagnosis present

## 2024-02-13 DIAGNOSIS — J189 Pneumonia, unspecified organism: Secondary | ICD-10-CM | POA: Diagnosis not present

## 2024-02-13 DIAGNOSIS — Z981 Arthrodesis status: Secondary | ICD-10-CM

## 2024-02-13 DIAGNOSIS — I700 Atherosclerosis of aorta: Secondary | ICD-10-CM | POA: Diagnosis present

## 2024-02-13 DIAGNOSIS — Z8546 Personal history of malignant neoplasm of prostate: Secondary | ICD-10-CM

## 2024-02-13 DIAGNOSIS — Z79899 Other long term (current) drug therapy: Secondary | ICD-10-CM | POA: Diagnosis not present

## 2024-02-13 DIAGNOSIS — R55 Syncope and collapse: Secondary | ICD-10-CM | POA: Diagnosis present

## 2024-02-13 DIAGNOSIS — N179 Acute kidney failure, unspecified: Secondary | ICD-10-CM | POA: Diagnosis present

## 2024-02-13 LAB — TYPE AND SCREEN
ABO/RH(D): A POS
Antibody Screen: NEGATIVE

## 2024-02-13 LAB — LIPASE, BLOOD: Lipase: 31 U/L (ref 11–51)

## 2024-02-13 LAB — CBC
HCT: 33.9 % — ABNORMAL LOW (ref 39.0–52.0)
Hemoglobin: 11.8 g/dL — ABNORMAL LOW (ref 13.0–17.0)
MCH: 28 pg (ref 26.0–34.0)
MCHC: 34.8 g/dL (ref 30.0–36.0)
MCV: 80.5 fL (ref 80.0–100.0)
Platelets: 203 K/uL (ref 150–400)
RBC: 4.21 MIL/uL — ABNORMAL LOW (ref 4.22–5.81)
RDW: 14.6 % (ref 11.5–15.5)
WBC: 4.9 K/uL (ref 4.0–10.5)
nRBC: 0 % (ref 0.0–0.2)

## 2024-02-13 LAB — COMPREHENSIVE METABOLIC PANEL WITH GFR
ALT: 23 U/L (ref 0–44)
AST: 23 U/L (ref 15–41)
Albumin: 3.4 g/dL — ABNORMAL LOW (ref 3.5–5.0)
Alkaline Phosphatase: 52 U/L (ref 38–126)
Anion gap: 12 (ref 5–15)
BUN: 12 mg/dL (ref 8–23)
CO2: 26 mmol/L (ref 22–32)
Calcium: 9.4 mg/dL (ref 8.9–10.3)
Chloride: 102 mmol/L (ref 98–111)
Creatinine, Ser: 1.33 mg/dL — ABNORMAL HIGH (ref 0.61–1.24)
GFR, Estimated: 56 mL/min — ABNORMAL LOW (ref >60)
Glucose, Bld: 118 mg/dL — ABNORMAL HIGH (ref 70–99)
Potassium: 3.8 mmol/L (ref 3.5–5.1)
Sodium: 140 mmol/L (ref 135–145)
Total Bilirubin: 0.7 mg/dL (ref 0.0–1.2)
Total Protein: 6 g/dL — ABNORMAL LOW (ref 6.5–8.1)

## 2024-02-13 LAB — MAGNESIUM: Magnesium: 1.4 mg/dL — ABNORMAL LOW (ref 1.7–2.4)

## 2024-02-13 LAB — T4, FREE: Free T4: 0.76 ng/dL (ref 0.61–1.12)

## 2024-02-13 LAB — TSH: TSH: 3.025 u[IU]/mL (ref 0.350–4.500)

## 2024-02-13 MED ORDER — SODIUM CHLORIDE 0.9 % IV SOLN
500.0000 mg | Freq: Once | INTRAVENOUS | Status: AC
  Administered 2024-02-13: 500 mg via INTRAVENOUS
  Filled 2024-02-13: qty 5

## 2024-02-13 MED ORDER — SODIUM CHLORIDE 0.9 % IV SOLN
8.0000 mg | Freq: Three times a day (TID) | INTRAVENOUS | Status: DC | PRN
  Administered 2024-02-13: 8 mg via INTRAVENOUS
  Filled 2024-02-13: qty 4

## 2024-02-13 MED ORDER — SODIUM CHLORIDE 0.9 % IV SOLN
2.0000 g | INTRAVENOUS | Status: DC
  Filled 2024-02-13: qty 20

## 2024-02-13 MED ORDER — MAGNESIUM SULFATE 2 GM/50ML IV SOLN
2.0000 g | Freq: Once | INTRAVENOUS | Status: AC
  Administered 2024-02-13: 2 g via INTRAVENOUS
  Filled 2024-02-13: qty 50

## 2024-02-13 MED ORDER — HEPARIN SODIUM (PORCINE) 5000 UNIT/ML IJ SOLN
5000.0000 [IU] | Freq: Three times a day (TID) | INTRAMUSCULAR | Status: DC
  Administered 2024-02-13 – 2024-02-15 (×6): 5000 [IU] via SUBCUTANEOUS
  Filled 2024-02-13 (×6): qty 1

## 2024-02-13 MED ORDER — IOHEXOL 350 MG/ML SOLN
75.0000 mL | Freq: Once | INTRAVENOUS | Status: AC | PRN
  Administered 2024-02-13: 75 mL via INTRAVENOUS

## 2024-02-13 MED ORDER — SODIUM CHLORIDE 0.9 % IV SOLN
500.0000 mg | INTRAVENOUS | Status: DC
  Filled 2024-02-13: qty 5

## 2024-02-13 MED ORDER — LACTATED RINGERS IV BOLUS
1000.0000 mL | Freq: Once | INTRAVENOUS | Status: AC
  Administered 2024-02-13: 1000 mL via INTRAVENOUS

## 2024-02-13 MED ORDER — SODIUM CHLORIDE 0.9% FLUSH
3.0000 mL | Freq: Two times a day (BID) | INTRAVENOUS | Status: DC
  Administered 2024-02-13 – 2024-02-15 (×3): 3 mL via INTRAVENOUS

## 2024-02-13 MED ORDER — HYDRALAZINE HCL 20 MG/ML IJ SOLN: 10.0000 mg | Freq: Four times a day (QID) | INTRAMUSCULAR | Status: DC | PRN

## 2024-02-13 MED ORDER — SODIUM CHLORIDE 0.9 % IV SOLN
2.0000 g | Freq: Once | INTRAVENOUS | Status: AC
  Administered 2024-02-13: 2 g via INTRAVENOUS
  Filled 2024-02-13: qty 20

## 2024-02-13 MED ORDER — PANTOPRAZOLE SODIUM 40 MG IV SOLR
40.0000 mg | Freq: Two times a day (BID) | INTRAVENOUS | Status: DC
  Administered 2024-02-13 (×2): 40 mg via INTRAVENOUS
  Filled 2024-02-13 (×2): qty 10

## 2024-02-13 MED ORDER — ACETAMINOPHEN 650 MG RE SUPP
650.0000 mg | Freq: Four times a day (QID) | RECTAL | Status: DC | PRN
  Administered 2024-02-13 – 2024-02-14 (×2): 650 mg via RECTAL
  Filled 2024-02-13 (×2): qty 1

## 2024-02-13 MED ORDER — SODIUM CHLORIDE 0.9 % IV SOLN: INTRAVENOUS | Status: DC

## 2024-02-13 NOTE — ED Notes (Signed)
 Pt slightly agitated and ripped tele monitoring off. Was easily re-directed and leads re-applied

## 2024-02-13 NOTE — ED Notes (Signed)
 Pt tore all monitoring off and stood up out of bed and urinated. Pt cleaned and placed back in bed. O2 sensor BP reapplied but patient will not leave the tele cords on. Family at bedside is of no assistance in re-directing pt.

## 2024-02-13 NOTE — ED Notes (Signed)
 Cardiology at bedside.

## 2024-02-13 NOTE — Consult Note (Signed)
 Cardiology Consultation   Patient ID: Dennis Huffman MRN: [id number]; DOB: 09/25/1950  Admit date: 02/13/2024 Date of Consult: 02/13/2024  PCP:  Ransom Other, MD   [facility] Providers Cardiologist:  Candyce Reek, MD        Patient Profile: Dennis Huffman is a 73 y.o. male with a hx of hypertension, hyperlipidemia, asymptomatic bradycardia, hx of prostate cancer, and dementia who is being seen 02/13/2024 for the evaluation of syncope and bradycardia at the request of Mario Blanch MD.  History of Present Illness: Dennis Huffman established care with cardiology for incidental finding of bradycardia. He was last seen by by Dr. Reek on 01/10/22. At that time patient was on metoprolol  for his PAC/PVC burden, this was stopped 2/2 bradycardia. He was lost to follow-up. He did however follow-up with Megan Supple for medication adjustment 10/2022. At that time is was unclear what medications the patient was actually taking as patient was unsure himself. He was contacted multiple times via telephone which were not answered. He was continued on amlodipine  5 mg and losartan  100 mg for blood pressure control.   Echocardiogram 06/2019 showed LVEF 60-65% with mild LVH and mildly dilated LA. Mild MR. Aortic dilation 42 mm. Heart Monitor 01/2020 showed 17 runs SVT 5-10 beats and some bigeminy and trigeminy.  Patient was seen at the [facility] ED on 01/25/2024 for LOC. At that time noted to be in sinus bradycardia HR 41. He was also noted to have an AKI (Cr 1.7) with no evidence of infection. Through shared decision making family, patient, and ED provider decided to discharge, the etiology was thought to be due to dehydration. Bradycardia was not attributed to his LOC as he has historically been asymptomatic with his bradycardia. He followed up with his PCP who reduced his blood pressure medications and completely stopped the metoprolol  succinate that was previously used as prn for palpations from  PAC/PVCs.  Presented to the ED from home after an unwitnessed syncopal episode in the bathroom, patient was found down by family. EMS noted patient was clammy, tachypneic,  disoriented, and had emesis on his face. He then had one episode of emesis and fecal incontinence.EMS found patient with HR in the 20s was given 1mg  atropine and 1L NS. Hr improved. Family noted patient had been refusing to eat solid foods prior to admission, only accepting liquids.  Serial ECG from EMS report are difficult to interpret due to low voltage and artifact.  ECG Sinus rhythm VR 63 in the ED Pertinent lab work: K 3.8, Cr 1.33, glucose 118, WBC 4.9 CT head negative for acute intracranial pathology CT A/P possible RLL PNA  He has received IV antibiotics and an 1L LR Most recent vitals: BP: 121/59    HR 56    Afebrile  On interview patient does not remember what happened. He is orientated to self and place but not time. Denied pain. Family shared that they do not have knowledge of any heart disease. Sister manages patient's medications, deny patient having access to them so he could not accidentally ingest them. He has not been eating well, just does not act hungry. Family say he seems at his baseline. Denied recent fever, cough, or sick contact.  Past Medical History:  Diagnosis Date   Arthritis    cervical spondylotic, radiculopathy   Bradycardia    a. HR previously reported as 40 at PCP's office; event monitor 2017 showed nocturnal bradycardia but no daytime findings.   CKD (chronic kidney disease), stage  II    Hypercholesteremia    Hypertension    Premature atrial contractions    Prostate cancer (HCC)    PVC's (premature ventricular contractions)     Past Surgical History:  Procedure Laterality Date   ANTERIOR CERVICAL DECOMP/DISCECTOMY FUSION N/A 05/12/2015   Procedure: ANTERIOR CERVICAL DECOMPRESSION/DISCECTOMY FUSION C3 - C6 3 LEVELS;  Surgeon: Donaciano Sprang, MD;  Location: [facility] OR;  Service:  Orthopedics;  Laterality: N/A;   colonscopy     COLONSCOPY      INSERTION PROSTATE RADIATION SEED  UNSURE OF DATE   DONE BY DR CEIL    PROSTATE BIOPSY     ROTATOR CUFF REPAIR Right 03-13-12; 09-27-15   TOTAL HIP ARTHROPLASTY Left 11/28/2018   Procedure: TOTAL HIP ARTHROPLASTY;  Surgeon: Shari Sieving, MD;  Location: [facility] ORS;  Service: Orthopedics;  Laterality: Left;       Scheduled Meds:  heparin   5,000 Units Subcutaneous Q8H   sodium chloride  flush  3 mL Intravenous Q12H   Continuous Infusions:  azithromycin  (ZITHROMAX ) 500 mg in sodium chloride  0.9 % 250 mL IVPB 500 mg (02/13/24 1443)   PRN Meds:   Allergies:   No Active Allergies  Social History:   Social History   Socioeconomic History   Marital status: Single    Spouse name: Not on file   Number of children: Not on file   Years of education: Not on file   Highest education level: Not on file  Occupational History   Not on file  Tobacco Use   Smoking status: Former    Current packs/day: 0.00    Types: Cigarettes    Quit date: 06/26/1971    Years since quitting: 52.6   Smokeless tobacco: Never  Vaping Use   Vaping status: Never Used  Substance and Sexual Activity   Alcohol use: Yes    Comment: socially    Drug use: No   Sexual activity: Yes  Other Topics Concern   Not on file  Social History Narrative   Right handed    Caffeine use: 1 cup coffee per day   Soda- not on a regular basis   Lives with mother, sister, brother, and nephew.   Social Drivers of Corporate investment banker Strain: Not on file  Food Insecurity: Not on file  Transportation Needs: Not on file  Physical Activity: Not on file  Stress: Not on file  Social Connections: Not on file  Intimate Partner Violence: Not on file    Family History:   Family History  Problem Relation Age of Onset   Healthy Mother    Healthy Father    Stroke Sister    Cancer Sister        breast     ROS:  Please see the history of present illness.   All other ROS reviewed and negative.     Physical Exam/Data: Vitals:   02/13/24 1137 02/13/24 1138 02/13/24 1200 02/13/24 1230  BP: 103/64  98/60 (!) 121/59  Pulse: 65  63 (!) 56  Resp: 13  18 14   Temp: 98.4 F (36.9 C)     TempSrc: Oral     SpO2: 93%  98% 100%  Weight:  78.9 kg    Height:  5' 11 (1.803 m)      Intake/Output Summary (Last 24 hours) at 02/13/2024 1504 Last data filed at 02/13/2024 1132 Gross per 24 hour  Intake 1000 ml  Output --  Net 1000 ml      02/13/2024  11:38 AM 09/10/2023   12:49 PM 07/10/2022   10:45 AM  Last 3 Weights  Weight (lbs) 174 lb 215 lb 8 oz 197 lb  Weight (kg) 78.926 kg 97.75 kg 89.359 kg     Body mass index is 24.27 kg/m.  General:  Calm, pleasant laying in bed in no acute distress though whole body mild tremor vs rigor HEENT: normal Vascular: Distal pulses 2+ bilaterally Cardiac:  normal S1, S2; RRR; no murmur  Lungs:  Rhonchi to left base, CTA on right side  Abd: soft, nontender Ext: no edema Musculoskeletal:  No deformities, BUE and BLE strength normal and equal Skin: warm and dry  Neuro:  CNs 2-12 intact, no focal abnormalities noted Psych:  Alert though spaces out, orientated to self and place but not situation and time   EKG:  The EKG was personally reviewed and demonstrates:  See HPI Telemetry:  Telemetry was personally reviewed and demonstrates:  episodes of sinus rhythm, & atrial vs junctional rhythm. 1 episode of non-sustained multiform VT ( 5 beats) HR avg 50-70  Relevant CV Studies: Echocardiogram 07/15/19 IMPRESSIONS     1. Left ventricular ejection fraction, by visual estimation, is 60 to  65%. The left ventricle has normal function. There is mildly increased  left ventricular hypertrophy.   2. The left ventricle has no regional wall motion abnormalities.   3. Global right ventricle has normal systolic function.The right  ventricular size is normal. No increase in right ventricular wall  thickness.   4. Left  atrial size was mildly dilated.   5. Right atrial size was normal.   6. The mitral valve is normal in structure. Mild mitral valve  regurgitation. No evidence of mitral stenosis.   7. The tricuspid valve is normal in structure.   8. The tricuspid valve is normal in structure. Tricuspid valve  regurgitation is not demonstrated.   9. The aortic valve is tricuspid. Aortic valve regurgitation is not  visualized. Mild aortic valve sclerosis without stenosis.  10. The pulmonic valve was normal in structure. Pulmonic valve  regurgitation is not visualized.  11. Aortic dilatation noted.  12. There is mild dilatation of the ascending aorta measuring 42 mm.  13. The inferior vena cava is normal in size with greater than 50%  respiratory variability, suggesting right atrial pressure of 3 mmHg.   Laboratory Data: High Sensitivity Troponin:   Recent Labs  Lab 01/25/24 1139  TROPONINIHS 5     Chemistry Recent Labs  Lab 02/13/24 1134  NA 140  K 3.8  CL 102  CO2 26  GLUCOSE 118*  BUN 12  CREATININE 1.33*  CALCIUM  9.4  GFRNONAA 56*  ANIONGAP 12    Recent Labs  Lab 02/13/24 1134  PROT 6.0*  ALBUMIN 3.4*  AST 23  ALT 23  ALKPHOS 52  BILITOT 0.7   Lipids No results for input(s): CHOL, TRIG, HDL, LABVLDL, LDLCALC, CHOLHDL in the last 168 hours.  Hematology Recent Labs  Lab 02/13/24 1126  WBC 4.9  RBC 4.21*  HGB 11.8*  HCT 33.9*  MCV 80.5  MCH 28.0  MCHC 34.8  RDW 14.6  PLT 203   Thyroid  No results for input(s): TSH, FREET4 in the last 168 hours.  BNPNo results for input(s): BNP, PROBNP in the last 168 hours.  DDimer No results for input(s): DDIMER in the last 168 hours.  Radiology/Studies:  CT ABDOMEN PELVIS W CONTRAST Result Date: 02/13/2024 CLINICAL DATA:  Acute, non localized abdominal pain. Found unconscious. EXAM:  CT ABDOMEN AND PELVIS WITH CONTRAST TECHNIQUE: Multidetector CT imaging of the abdomen and pelvis was performed using the standard  protocol following bolus administration of intravenous contrast. RADIATION DOSE REDUCTION: This exam was performed according to the departmental dose-optimization program which includes automated exposure control, adjustment of the mA and/or kV according to patient size and/or use of iterative reconstruction technique. CONTRAST:  75mL OMNIPAQUE  IOHEXOL  350 MG/ML SOLN COMPARISON:  02/09/2022.  Chest CTA dated 01/18/2020. FINDINGS: Lower chest: Stable partially included small hiatal hernia. Interval right lower lobe patchy densities and minimal bibasilar dependent atelectasis. Hepatobiliary: Small, faintly calcified gallstones in the gallbladder measuring up to 4 mm in maximum diameter each. No gallbladder wall thickening or pericholecystic fluid. Unremarkable liver. Pancreas: Unremarkable. No pancreatic ductal dilatation or surrounding inflammatory changes. Spleen: Normal in size without focal abnormality. Adrenals/Urinary Tract: Normal-appearing adrenal glands. Small simple appearing left renal cyst. This does not need imaging follow-up. Unremarkable right kidney, ureters and urinary bladder. Stomach/Bowel: Small hiatal hernia. Unremarkable small bowel and appendix. Minimal descending colon diverticulosis. Vascular/Lymphatic: Atheromatous arterial calcifications without aneurysm. No enlarged lymph nodes. Reproductive: Prostate radiation seeds. Other: Small to moderate-sized umbilical hernia containing fat. Musculoskeletal: Left hip prosthesis. Moderate right hip degenerative changes. Mild lumbar and moderate to marked lower thoracic spine degenerative changes. IMPRESSION: 1. No acute abnormality in the abdomen or pelvis. 2. Interval right lower lobe patchy densities, compatible with pneumonia or aspiration pneumonitis. 3. Cholelithiasis without evidence of cholecystitis. 4. Small to moderate-sized umbilical hernia containing fat. 5. Small hiatal hernia. 6. Minimal descending colon diverticulosis. Electronically  Signed   By: Elspeth Bathe M.D.   On: 02/13/2024 13:27   CT Head Wo Contrast Result Date: 02/13/2024 CLINICAL DATA:  Head trauma.  Syncopal episode. EXAM: CT HEAD WITHOUT CONTRAST CT CERVICAL SPINE WITHOUT CONTRAST TECHNIQUE: Multidetector CT imaging of the head and cervical spine was performed following the standard protocol without intravenous contrast. Multiplanar CT image reconstructions of the cervical spine were also generated. RADIATION DOSE REDUCTION: This exam was performed according to the departmental dose-optimization program which includes automated exposure control, adjustment of the mA and/or kV according to patient size and/or use of iterative reconstruction technique. COMPARISON:  01/25/2024 FINDINGS: CT HEAD FINDINGS Brain: No sign of acute stroke, mass, hemorrhage, hydrocephalus or extra-axial collection. Frontal and temporal lobe atrophy, similar to the study of 2 weeks ago. Vascular: There is atherosclerotic calcification of the major vessels at the base of the brain. Skull: Negative Sinuses/Orbits: Clear sinuses. Old medial orbital blowout fractures. No acute orbital finding. Other: None CT CERVICAL SPINE FINDINGS Alignment: No malalignment. Skull base and vertebrae: No fracture or focal lesion. Distant ACDF C3 through C6 with solid union. Soft tissues and spinal canal: No traumatic soft tissue finding. Disc levels: The foramen magnum is widely patent. There is ordinary mild osteoarthritis of the C1-2 articulation but no encroachment upon the neural structures. C2-3: No stenosis.  Anterior calcification incidentally noted. C3 through C6: Previous ACDF with solid union and sufficient patency of the canal and foramina. C6-7: Degenerative spondylosis with endplate osteophytes. Bilateral foraminal narrowing that could affect either C7 nerve. C7-T1: Mild facet osteoarthritis on the left.  No stenosis. Upper chest: Negative Other: None IMPRESSION: HEAD CT: No acute or traumatic finding. Frontal and  temporal lobe atrophy. CERVICAL SPINE CT: No acute or traumatic finding. Previous ACDF C3 through C6 with solid union and sufficient patency of the canal and foramina. Degenerative spondylosis at C6-7 with bilateral foraminal narrowing that could affect either C7 nerve. Electronically  Signed   By: Oneil Officer M.D.   On: 02/13/2024 13:12   CT Cervical Spine Wo Contrast Result Date: 02/13/2024 CLINICAL DATA:  Head trauma.  Syncopal episode. EXAM: CT HEAD WITHOUT CONTRAST CT CERVICAL SPINE WITHOUT CONTRAST TECHNIQUE: Multidetector CT imaging of the head and cervical spine was performed following the standard protocol without intravenous contrast. Multiplanar CT image reconstructions of the cervical spine were also generated. RADIATION DOSE REDUCTION: This exam was performed according to the departmental dose-optimization program which includes automated exposure control, adjustment of the mA and/or kV according to patient size and/or use of iterative reconstruction technique. COMPARISON:  01/25/2024 FINDINGS: CT HEAD FINDINGS Brain: No sign of acute stroke, mass, hemorrhage, hydrocephalus or extra-axial collection. Frontal and temporal lobe atrophy, similar to the study of 2 weeks ago. Vascular: There is atherosclerotic calcification of the major vessels at the base of the brain. Skull: Negative Sinuses/Orbits: Clear sinuses. Old medial orbital blowout fractures. No acute orbital finding. Other: None CT CERVICAL SPINE FINDINGS Alignment: No malalignment. Skull base and vertebrae: No fracture or focal lesion. Distant ACDF C3 through C6 with solid union. Soft tissues and spinal canal: No traumatic soft tissue finding. Disc levels: The foramen magnum is widely patent. There is ordinary mild osteoarthritis of the C1-2 articulation but no encroachment upon the neural structures. C2-3: No stenosis.  Anterior calcification incidentally noted. C3 through C6: Previous ACDF with solid union and sufficient patency of the  canal and foramina. C6-7: Degenerative spondylosis with endplate osteophytes. Bilateral foraminal narrowing that could affect either C7 nerve. C7-T1: Mild facet osteoarthritis on the left.  No stenosis. Upper chest: Negative Other: None IMPRESSION: HEAD CT: No acute or traumatic finding. Frontal and temporal lobe atrophy. CERVICAL SPINE CT: No acute or traumatic finding. Previous ACDF C3 through C6 with solid union and sufficient patency of the canal and foramina. Degenerative spondylosis at C6-7 with bilateral foraminal narrowing that could affect either C7 nerve. Electronically Signed   By: Oneil Officer M.D.   On: 02/13/2024 13:12     Assessment and Plan: Syncope Bradycardia Hx of PVC/PAC burden Patient presenting after he was found down in the bathroom by family. Patient has several episodes of emesis and fecal incontinence.  He was given 1 mg atropine for perceived HR 20s and 1L of normal saline. I cannot find ECG tracing to correlate to HR in the 20s.  ECG on arrival showed atrial vs junctional rhythm VR 63, and on telemetry he is returning to sinus rhythm intermittently. HR 50-70.   He recently presented to the ED for LOC that was thought to be related to dehydration. He is currently admitted with PNA, CT head was negative for acute pathology. ECG shows no ischemic changes. At this time cannot completely rule out syncope was not related to his bradycardia, though he has historically tolerated bradycardia.   Do not think we need to reach out to EP for a pacemaker at this time. Will continue to monitor telemetry, he may benefit from heart monitor at discharge if nothing noted. Will obtain an updated echo this admission.  Keep electrolytes K >4 and Mag > 2. Will order mag.   Hx Hypertension His blood pressure management has been slowly decreasing. At last visit [facility] pharmacist there were concerns about mismanagement as he was unable to relay his medications and was confused at visit.  He has now been diagnosed with dementia and his daughter handles his medications.  BP: 121/106 Hold PTA Amlodipine  5 mg &  Losartan  50 mg   Hyperlipidemia Continue lipitor 20 mg   Per primary PNA Dementia   Risk Assessment/Risk Scores:              For questions or updates, please contact [facility] Please consult [email or web address] for contact info under    Signed, Leontine LOISE Salen, PA-C  02/13/2024 3:04 PM

## 2024-02-13 NOTE — ED Provider Notes (Signed)
 [facility] Provider Note   CSN: [id number] Arrival date & time: 02/13/24  1122     Patient presents with: Loss of Consciousness, Nausea, Emesis, and Diarrhea   Dennis Huffman is a 73 y.o. male.   73 year old male with past medical history of dementia, hypertension, and hyperlipidemia presenting to the emergency department today after an apparent syncopal episode at home.  The patient has reportedly not been eating or drinking normally over the past few weeks.  He was noted to have some nausea, vomiting, and diarrhea this morning.  The patient was found to be bradycardic.  Received atropine with medics.  The patient is denying any complaints but does have dementia at baseline.  He is unable to provide any further history.   Loss of Consciousness Associated symptoms: vomiting   Emesis Associated symptoms: diarrhea   Diarrhea Associated symptoms: vomiting        Prior to Admission medications   Medication Sig Start Date End Date Taking? Authorizing Provider  acetaminophen  (TYLENOL ) 650 MG CR tablet Take 650-1,300 mg by mouth every 8 (eight) hours as needed for pain.   Yes [provider]  amLODipine  (NORVASC ) 5 MG tablet Take 2.5 mg by mouth daily.   Yes [provider]  atorvastatin  (LIPITOR) 20 MG tablet Take 20 mg by mouth daily.   Yes [provider]  donepezil  (ARICEPT ) 10 MG tablet Take 1 tablet (10 mg total) by mouth at bedtime. 09/25/22  Yes Sater, Charlie LABOR, MD  losartan  (COZAAR ) 100 MG tablet Take 50 mg by mouth daily.   Yes [provider]  memantine  (NAMENDA ) 10 MG tablet Take 1 tablet (10 mg total) by mouth 2 (two) times daily. Begin this prescription after the 5 mg is complete 09/10/23  Yes Sater, Charlie LABOR, MD  pantoprazole  (PROTONIX ) 40 MG tablet Take 40 mg by mouth daily.   Yes [provider]  sertraline  (ZOLOFT ) 50 MG tablet Take 50 mg by mouth daily. 01/04/23  Yes [provider]  memantine  (NAMENDA ) 5 MG tablet Take 1 tablet (5 mg total) by mouth 2 (two) times daily. Take one pill once a day for 2 weeks then take one pill twice a day for 2 weeks Patient not taking: Reported on 02/13/2024 09/10/23   Vear Charlie LABOR, MD  metoprolol  succinate (TOPROL -XL) 50 MG 24 hr tablet Take 50 mg by mouth daily. Take with or immediately following a meal. Patient not taking: Reported on 02/13/2024    [provider]    Allergies: Patient has no active allergies.    Review of Systems  Reason unable to perform ROS: Dementia.  Cardiovascular:  Positive for syncope.  Gastrointestinal:  Positive for diarrhea and vomiting.    Updated Vital Signs BP (!) 121/106 (BP Location: Right Arm)   Pulse 73   Temp 98.9 F (37.2 C) (Oral)   Resp (!) 21   Ht 5' 11 (1.803 m)   Wt 78.9 kg   SpO2 97%   BMI 24.27 kg/m   Physical Exam Vitals and nursing note reviewed.   Gen: NAD Eyes: PERRL, EOMI HEENT: no oropharyngeal swelling Neck: trachea midline Resp: clear to auscultation bilaterally Card: RRR, no murmurs, rubs, or gallops Abd: nontender, nondistended Extremities: no calf tenderness, no edema Vascular: 2+ radial pulses bilaterally, 2+ DP pulses bilaterally Neuro: No focal deficits Skin: no rashes Psyc: acting appropriately   (all labs ordered are listed, but only abnormal results are displayed) Labs Reviewed  CBC - Abnormal; Notable for the following components:      Result Value   RBC 4.21 (*)    Hemoglobin 11.8 (*)    HCT 33.9 (*)    All other components within normal limits  COMPREHENSIVE METABOLIC PANEL WITH GFR - Abnormal; Notable for the following components:   Glucose, Bld 118 (*)    Creatinine, Ser 1.33 (*)    Total Protein 6.0 (*)    Albumin 3.4 (*)    GFR, Estimated 56 (*)    All other components within normal limits  LIPASE, BLOOD  TSH  T4, FREE  TYPE AND SCREEN    EKG: EKG Interpretation Date/Time:  Thursday February 13 2024  11:27:25 EDT Ventricular Rate:  63 PR Interval:  119 QRS Duration:  99 QT Interval:  424 QTC Calculation: 434 R Axis:   44  Text Interpretation: Sinus or ectopic atrial rhythm Borderline short PR interval Confirmed by Ula Barter [phone]) on 02/13/2024 11:42:25 AM  Radiology: CT ABDOMEN PELVIS W CONTRAST Result Date: 02/13/2024 CLINICAL DATA:  Acute, non localized abdominal pain. Found unconscious. EXAM: CT ABDOMEN AND PELVIS WITH CONTRAST TECHNIQUE: Multidetector CT imaging of the abdomen and pelvis was performed using the standard protocol following bolus administration of intravenous contrast. RADIATION DOSE REDUCTION: This exam was performed according to the departmental dose-optimization program which includes automated exposure control, adjustment of the mA and/or kV according to patient size and/or use of iterative reconstruction technique. CONTRAST:  75mL OMNIPAQUE  IOHEXOL  350 MG/ML SOLN COMPARISON:  02/09/2022.  Chest CTA dated 01/18/2020. FINDINGS: Lower chest: Stable partially included small hiatal hernia. Interval right lower lobe patchy densities and minimal bibasilar dependent atelectasis. Hepatobiliary: Small, faintly calcified gallstones in the gallbladder measuring up to 4 mm in maximum diameter each. No gallbladder wall thickening or pericholecystic fluid. Unremarkable liver. Pancreas: Unremarkable. No pancreatic ductal dilatation or surrounding inflammatory changes. Spleen: Normal in size without focal abnormality. Adrenals/Urinary Tract: Normal-appearing adrenal glands. Small simple appearing left renal cyst. This does not need imaging follow-up. Unremarkable right kidney, ureters and urinary bladder. Stomach/Bowel: Small hiatal hernia. Unremarkable small bowel and appendix. Minimal descending colon diverticulosis. Vascular/Lymphatic: Atheromatous arterial calcifications without aneurysm. No enlarged lymph nodes. Reproductive: Prostate radiation seeds. Other: Small to moderate-sized  umbilical hernia containing fat. Musculoskeletal: Left hip prosthesis. Moderate right hip degenerative changes. Mild lumbar and moderate to marked lower thoracic spine degenerative changes. IMPRESSION: 1. No acute abnormality in the abdomen or pelvis. 2. Interval right lower lobe patchy densities, compatible with pneumonia or aspiration pneumonitis. 3. Cholelithiasis without evidence of cholecystitis. 4. Small to moderate-sized umbilical hernia containing fat. 5. Small hiatal hernia. 6. Minimal descending colon diverticulosis. Electronically Signed   By: Elspeth Bathe M.D.   On: 02/13/2024 13:27   CT Head Wo Contrast Result Date: 02/13/2024 CLINICAL DATA:  Head trauma.  Syncopal episode. EXAM: CT HEAD WITHOUT CONTRAST CT CERVICAL SPINE WITHOUT CONTRAST TECHNIQUE: Multidetector CT imaging of the head and cervical spine was performed following the standard protocol without intravenous contrast. Multiplanar CT image reconstructions of the cervical spine were also generated. RADIATION DOSE REDUCTION: This exam was performed according to the departmental dose-optimization program which includes automated exposure control, adjustment of the mA and/or kV according to patient size and/or use of iterative reconstruction technique. COMPARISON:  01/25/2024 FINDINGS: CT HEAD FINDINGS Brain: No sign of acute stroke, mass, hemorrhage, hydrocephalus or extra-axial collection. Frontal and temporal lobe atrophy, similar to the study of 2 weeks ago. Vascular: There is atherosclerotic calcification of the major  vessels at the base of the brain. Skull: Negative Sinuses/Orbits: Clear sinuses. Old medial orbital blowout fractures. No acute orbital finding. Other: None CT CERVICAL SPINE FINDINGS Alignment: No malalignment. Skull base and vertebrae: No fracture or focal lesion. Distant ACDF C3 through C6 with solid union. Soft tissues and spinal canal: No traumatic soft tissue finding. Disc levels: The foramen magnum is widely patent.  There is ordinary mild osteoarthritis of the C1-2 articulation but no encroachment upon the neural structures. C2-3: No stenosis.  Anterior calcification incidentally noted. C3 through C6: Previous ACDF with solid union and sufficient patency of the canal and foramina. C6-7: Degenerative spondylosis with endplate osteophytes. Bilateral foraminal narrowing that could affect either C7 nerve. C7-T1: Mild facet osteoarthritis on the left.  No stenosis. Upper chest: Negative Other: None IMPRESSION: HEAD CT: No acute or traumatic finding. Frontal and temporal lobe atrophy. CERVICAL SPINE CT: No acute or traumatic finding. Previous ACDF C3 through C6 with solid union and sufficient patency of the canal and foramina. Degenerative spondylosis at C6-7 with bilateral foraminal narrowing that could affect either C7 nerve. Electronically Signed   By: Oneil Officer M.D.   On: 02/13/2024 13:12   CT Cervical Spine Wo Contrast Result Date: 02/13/2024 CLINICAL DATA:  Head trauma.  Syncopal episode. EXAM: CT HEAD WITHOUT CONTRAST CT CERVICAL SPINE WITHOUT CONTRAST TECHNIQUE: Multidetector CT imaging of the head and cervical spine was performed following the standard protocol without intravenous contrast. Multiplanar CT image reconstructions of the cervical spine were also generated. RADIATION DOSE REDUCTION: This exam was performed according to the departmental dose-optimization program which includes automated exposure control, adjustment of the mA and/or kV according to patient size and/or use of iterative reconstruction technique. COMPARISON:  01/25/2024 FINDINGS: CT HEAD FINDINGS Brain: No sign of acute stroke, mass, hemorrhage, hydrocephalus or extra-axial collection. Frontal and temporal lobe atrophy, similar to the study of 2 weeks ago. Vascular: There is atherosclerotic calcification of the major vessels at the base of the brain. Skull: Negative Sinuses/Orbits: Clear sinuses. Old medial orbital blowout fractures. No acute  orbital finding. Other: None CT CERVICAL SPINE FINDINGS Alignment: No malalignment. Skull base and vertebrae: No fracture or focal lesion. Distant ACDF C3 through C6 with solid union. Soft tissues and spinal canal: No traumatic soft tissue finding. Disc levels: The foramen magnum is widely patent. There is ordinary mild osteoarthritis of the C1-2 articulation but no encroachment upon the neural structures. C2-3: No stenosis.  Anterior calcification incidentally noted. C3 through C6: Previous ACDF with solid union and sufficient patency of the canal and foramina. C6-7: Degenerative spondylosis with endplate osteophytes. Bilateral foraminal narrowing that could affect either C7 nerve. C7-T1: Mild facet osteoarthritis on the left.  No stenosis. Upper chest: Negative Other: None IMPRESSION: HEAD CT: No acute or traumatic finding. Frontal and temporal lobe atrophy. CERVICAL SPINE CT: No acute or traumatic finding. Previous ACDF C3 through C6 with solid union and sufficient patency of the canal and foramina. Degenerative spondylosis at C6-7 with bilateral foraminal narrowing that could affect either C7 nerve. Electronically Signed   By: Oneil Officer M.D.   On: 02/13/2024 13:12     Procedures   Medications Ordered in the ED  sodium chloride  flush (NS) 0.9 % injection 3 mL (3 mLs Intravenous Not Given 02/13/24 1511)  heparin  injection 5,000 Units (has no administration in time range)  ondansetron  (ZOFRAN ) 8 mg in sodium chloride  0.9 % 50 mL IVPB (has no administration in time range)  pantoprazole  (PROTONIX ) injection 40 mg (40  mg Intravenous Given 02/13/24 1549)  hydrALAZINE  (APRESOLINE ) injection 10 mg (has no administration in time range)  azithromycin  (ZITHROMAX ) 500 mg in sodium chloride  0.9 % 250 mL IVPB (has no administration in time range)  cefTRIAXone  (ROCEPHIN ) 2 g in sodium chloride  0.9 % 100 mL IVPB (has no administration in time range)  0.9 %  sodium chloride  infusion (has no administration in time  range)  lactated ringers  bolus 1,000 mL (0 mLs Intravenous Stopped 02/13/24 1311)  iohexol  (OMNIPAQUE ) 350 MG/ML injection 75 mL (75 mLs Intravenous Contrast Given 02/13/24 1252)  cefTRIAXone  (ROCEPHIN ) 2 g in sodium chloride  0.9 % 100 mL IVPB (0 g Intravenous Stopped 02/13/24 1443)  azithromycin  (ZITHROMAX ) 500 mg in sodium chloride  0.9 % 250 mL IVPB (0 mg Intravenous Stopped 02/13/24 1545)                                    Medical Decision Making 73 year old male with past medical history of hypertension, hyperlipidemia, and remote history of prostate cancer presenting to the emergency department today after a syncopal episode.  This in the setting of not eating and drinking much over the past few weeks as well as some nausea, vomiting, and diarrhea.  Will further evaluate the patient here with basic labs including LFTs and lipase.  Will obtain EKG and keep the patient on the cardiac monitor to evaluate for arrhythmias.  Will obtain a CT scan of his head and cervical spine to evaluate for acute traumatic injuries.  Will obtain a CT scan of his abdomen given the nausea, vomiting, decreased appetite here as this is his second presentation with similar symptoms.  I will reevaluate for ultimate disposition.  The patient CT scan does show findings concerning for pneumonia.  The patient is not been eating or drink.  Discussed this with family and offered admission versus outpatient management.  Family would like patient admitted.  Covered with rocephin  and azithromycin .  Will be admitted for further management.   Amount and/or Complexity of Data Reviewed Labs: ordered. Radiology: ordered.  Risk Prescription drug management. Decision regarding hospitalization.        Final diagnoses:  Pneumonia due to infectious organism, unspecified laterality, unspecified part of lung    ED Discharge Orders     None          Ula Prentice SAUNDERS, MD 02/13/24 [phone]

## 2024-02-13 NOTE — ED Triage Notes (Signed)
 Pt to ER from home via EMS reports unwitnessed syncopal episode in bathroom. Sister found him unconscious, called 911. Patient alert but disoriented upon arrival. HR low, possibly in 20's 1mg  atropine administered + 1 liter NS. Patient arrives Alert and oriented to self, place, situation. Denies recollection of recent activities. Family states that he has not been eating or drinking and has had N/V/D

## 2024-02-13 NOTE — H&P (Addendum)
 History and Physical    Patient: Dennis Huffman FMW:[id number] DOB: 1950-10-14 DOA: 02/13/2024 DOS: the patient was seen and examined on 02/13/2024 . PCP: Ransom Other, MD  Patient coming from: Home Chief complaint: Chief Complaint  Patient presents with   Loss of Consciousness   Nausea   Emesis   Diarrhea   HPI:  Dennis Huffman is a 73 y.o. male with past medical history  of bradycardia where patient was evaluated by cardiology and attempted for titration of metoprolol  dose in the past,HTN, aortic atherosclerosis, HLD, CKD, and PACs/PVCs, dementia on Aricept , presenting for syncope again today while at home family heard patient fall in the bathroom, his sister found him unconscious and called 911.  Heart rate was 20s and was given 1 mg of atropine along with normal saline bolus..  Also reports that he has not been eating well.  Chart review shows patient had a similar episode on 2 August where patient was evaluated in  the emergency room at discharge and close follow-up with his PCP.  Sister is at bedside patient does not engaging in conversation.  He is cooperative awake and follows commands. ED Course:  Vital signs in the ED were notable for the following:  Vitals:   02/13/24 1200 02/13/24 1230 02/13/24 1531 02/13/24 1546  BP: 98/60 (!) 121/59 (!) 136/116 (!) 121/106  Pulse: 63 (!) 56 79 73  Temp:   98.9 F (37.2 C)   Resp: 18 14 (!) 24 (!) 21  Height:      Weight:      SpO2: 98% 100% 97% 97%  TempSrc:   Oral   BMI (Calculated):      >>ED evaluation thus far shows: CMP shows glucose of 118 CKD stage III AA with a creatinine of 1.33 and EGFR 56 normal LFTs. CBC shows normal white count of 4.9 hemoglobin of 11.8 and platelets of 203. Patient had a CT head that was negative for any acute findings, CT C-spine negative for any acute findings, CT of the abdomen and pelvis with contrast shows right lower lobe densities consistent with pneumonia or aspiration pneumonia,  cholelithiasis, small to moderate-sized umbilical hernia, descending colon diverticulosis.   >>While in the ED patient received the following: Medications  cefTRIAXone  (ROCEPHIN ) 2 g in sodium chloride  0.9 % 100 mL IVPB (2 g Intravenous New Bag/Given 02/13/24 1414)  azithromycin  (ZITHROMAX ) 500 mg in sodium chloride  0.9 % 250 mL IVPB (has no administration in time range)  lactated ringers  bolus 1,000 mL (0 mLs Intravenous Stopped 02/13/24 1311)  iohexol  (OMNIPAQUE ) 350 MG/ML injection 75 mL (75 mLs Intravenous Contrast Given 02/13/24 1252)   Review of Systems  Unable to perform ROS: Dementia   Past Medical History:  Diagnosis Date   Arthritis    cervical spondylotic, radiculopathy   Bradycardia    a. HR previously reported as 40 at PCP's office; event monitor 2017 showed nocturnal bradycardia but no daytime findings.   CKD (chronic kidney disease), stage II    Hypercholesteremia    Hypertension    Premature atrial contractions    Prostate cancer (HCC)    PVC's (premature ventricular contractions)    Past Surgical History:  Procedure Laterality Date   ANTERIOR CERVICAL DECOMP/DISCECTOMY FUSION N/A 05/12/2015   Procedure: ANTERIOR CERVICAL DECOMPRESSION/DISCECTOMY FUSION C3 - C6 3 LEVELS;  Surgeon: Donaciano Sprang, MD;  Location: [facility] OR;  Service: Orthopedics;  Laterality: N/A;   colonscopy     COLONSCOPY      INSERTION PROSTATE RADIATION  SEED  UNSURE OF DATE   DONE BY DR CEIL    PROSTATE BIOPSY     ROTATOR CUFF REPAIR Right 03-13-12; 09-27-15   TOTAL HIP ARTHROPLASTY Left 11/28/2018   Procedure: TOTAL HIP ARTHROPLASTY;  Surgeon: Shari Sieving, MD;  Location: [facility];  Service: Orthopedics;  Laterality: Left;    reports that he quit smoking about 52 years ago. His smoking use included cigarettes. He has never used smokeless tobacco. He reports current alcohol use. He reports that he does not use drugs. No Active Allergies Family History  Problem Relation Age of Onset   Healthy  Mother    Healthy Father    Stroke Sister    Cancer Sister        breast   Prior to Admission medications   Medication Sig Start Date End Date Taking? Authorizing Provider  acetaminophen  (TYLENOL ) 650 MG CR tablet Take 650-1,300 mg by mouth every 8 (eight) hours as needed for pain.    [provider]  amLODipine  (NORVASC ) 5 MG tablet Take 5 mg by mouth daily.    [provider]  atorvastatin  (LIPITOR) 10 MG tablet Take 10 mg by mouth daily.    [provider]  donepezil  (ARICEPT ) 10 MG tablet Take 1 tablet (10 mg total) by mouth at bedtime. 09/25/22   Sater, Charlie LABOR, MD  losartan  (COZAAR ) 100 MG tablet Take 100 mg by mouth daily.    [provider]  memantine  (NAMENDA ) 10 MG tablet Take 1 tablet (10 mg total) by mouth 2 (two) times daily. Begin this prescription after the 5 mg is complete 09/10/23   Sater, Charlie LABOR, MD  memantine  (NAMENDA ) 5 MG tablet Take 1 tablet (5 mg total) by mouth 2 (two) times daily. Take one pill once a day for 2 weeks then take one pill twice a day for 2 weeks 09/10/23   Sater, Charlie LABOR, MD  metoprolol  succinate (TOPROL -XL) 50 MG 24 hr tablet Take 50 mg by mouth daily. Take with or immediately following a meal.    [provider]  pantoprazole  (PROTONIX ) 40 MG tablet Take 40 mg by mouth daily.    [provider]                                                                                 Vitals:   02/13/24 1200 02/13/24 1230 02/13/24 1531 02/13/24 1546  BP: 98/60 (!) 121/59 (!) 136/116 (!) 121/106  Pulse: 63 (!) 56 79 73  Resp: 18 14 (!) 24 (!) 21  Temp:   98.9 F (37.2 C)   TempSrc:   Oral   SpO2: 98% 100% 97% 97%  Weight:      Height:       Physical Exam Vitals reviewed.  Constitutional:      General: He is not in acute distress.    Appearance: He is not ill-appearing.  HENT:     Head: Normocephalic.  Eyes:     Extraocular Movements: Extraocular movements intact.  Cardiovascular:     Rate and  Rhythm: Normal rate and regular rhythm.     Heart sounds: Normal heart sounds.  Pulmonary:     Breath sounds:  Normal breath sounds.  Abdominal:     General: There is no distension.     Palpations: Abdomen is soft.     Tenderness: There is no abdominal tenderness.  Neurological:     General: No focal deficit present.     Mental Status: He is alert.     Labs on Admission: I have personally reviewed following labs and imaging studies CBC: Recent Labs  Lab 02/13/24 1126  WBC 4.9  HGB 11.8*  HCT 33.9*  MCV 80.5  PLT 203   Basic Metabolic Panel: Recent Labs  Lab 02/13/24 1134  NA 140  K 3.8  CL 102  CO2 26  GLUCOSE 118*  BUN 12  CREATININE 1.33*  CALCIUM  9.4   GFR: Estimated Creatinine Clearance: 51.1 mL/min (A) (by C-G formula based on SCr of 1.33 mg/dL (H)). Liver Function Tests: Recent Labs  Lab 02/13/24 1134  AST 23  ALT 23  ALKPHOS 52  BILITOT 0.7  PROT 6.0*  ALBUMIN 3.4*   Recent Labs  Lab 02/13/24 1134  LIPASE 31   No results for input(s): AMMONIA in the last 168 hours. Recent Labs    01/25/24 1021 02/13/24 1134  BUN 16 12  CREATININE 1.70* 1.33*    Estimated Creatinine Clearance: 51.1 mL/min (A) (by C-G formula based on SCr of 1.33 mg/dL (H)).   Recent Labs    01/25/24 1021 02/13/24 1134  BUN 16 12  CREATININE 1.70* 1.33*  CO2 23 26   Cardiac Enzymes: No results for input(s): CKTOTAL, CKMB, CKMBINDEX, TROPONINI in the last 168 hours. BNP (last 3 results) No results for input(s): PROBNP in the last 8760 hours. HbA1C: No results for input(s): HGBA1C in the last 72 hours. CBG: No results for input(s): GLUCAP in the last 168 hours. Lipid Profile: No results for input(s): CHOL, HDL, LDLCALC, TRIG, CHOLHDL, LDLDIRECT in the last 72 hours. Thyroid  Function Tests: Recent Labs    02/13/24 1134  TSH 3.025   Anemia Panel: No results for input(s): VITAMINB12, FOLATE, FERRITIN, TIBC, IRON,  RETICCTPCT in the last 72 hours. Urine analysis:    Component Value Date/Time   COLORURINE YELLOW 05/23/2021 1008   APPEARANCEUR CLEAR 05/23/2021 1008   LABSPEC 1.020 05/23/2021 1008   PHURINE 6.0 05/23/2021 1008   GLUCOSEU NEGATIVE 05/23/2021 1008   HGBUR NEGATIVE 05/23/2021 1008   BILIRUBINUR NEGATIVE 05/23/2021 1008   KETONESUR NEGATIVE 05/23/2021 1008   PROTEINUR NEGATIVE 05/23/2021 1008   UROBILINOGEN 0.2 02/18/2007 2108   NITRITE NEGATIVE 05/23/2021 1008   LEUKOCYTESUR NEGATIVE 05/23/2021 1008   Radiological Exams on Admission: CT ABDOMEN PELVIS W CONTRAST Result Date: 02/13/2024 CLINICAL DATA:  Acute, non localized abdominal pain. Found unconscious. EXAM: CT ABDOMEN AND PELVIS WITH CONTRAST TECHNIQUE: Multidetector CT imaging of the abdomen and pelvis was performed using the standard protocol following bolus administration of intravenous contrast. RADIATION DOSE REDUCTION: This exam was performed according to the departmental dose-optimization program which includes automated exposure control, adjustment of the mA and/or kV according to patient size and/or use of iterative reconstruction technique. CONTRAST:  75mL OMNIPAQUE  IOHEXOL  350 MG/ML SOLN COMPARISON:  02/09/2022.  Chest CTA dated 01/18/2020. FINDINGS: Lower chest: Stable partially included small hiatal hernia. Interval right lower lobe patchy densities and minimal bibasilar dependent atelectasis. Hepatobiliary: Small, faintly calcified gallstones in the gallbladder measuring up to 4 mm in maximum diameter each. No gallbladder wall thickening or pericholecystic fluid. Unremarkable liver. Pancreas: Unremarkable. No pancreatic ductal dilatation or surrounding inflammatory changes. Spleen: Normal in size without focal  abnormality. Adrenals/Urinary Tract: Normal-appearing adrenal glands. Small simple appearing left renal cyst. This does not need imaging follow-up. Unremarkable right kidney, ureters and urinary bladder. Stomach/Bowel:  Small hiatal hernia. Unremarkable small bowel and appendix. Minimal descending colon diverticulosis. Vascular/Lymphatic: Atheromatous arterial calcifications without aneurysm. No enlarged lymph nodes. Reproductive: Prostate radiation seeds. Other: Small to moderate-sized umbilical hernia containing fat. Musculoskeletal: Left hip prosthesis. Moderate right hip degenerative changes. Mild lumbar and moderate to marked lower thoracic spine degenerative changes. IMPRESSION: 1. No acute abnormality in the abdomen or pelvis. 2. Interval right lower lobe patchy densities, compatible with pneumonia or aspiration pneumonitis. 3. Cholelithiasis without evidence of cholecystitis. 4. Small to moderate-sized umbilical hernia containing fat. 5. Small hiatal hernia. 6. Minimal descending colon diverticulosis. Electronically Signed   By: Elspeth Bathe M.D.   On: 02/13/2024 13:27   CT Head Wo Contrast Result Date: 02/13/2024 CLINICAL DATA:  Head trauma.  Syncopal episode. EXAM: CT HEAD WITHOUT CONTRAST CT CERVICAL SPINE WITHOUT CONTRAST TECHNIQUE: Multidetector CT imaging of the head and cervical spine was performed following the standard protocol without intravenous contrast. Multiplanar CT image reconstructions of the cervical spine were also generated. RADIATION DOSE REDUCTION: This exam was performed according to the departmental dose-optimization program which includes automated exposure control, adjustment of the mA and/or kV according to patient size and/or use of iterative reconstruction technique. COMPARISON:  01/25/2024 FINDINGS: CT HEAD FINDINGS Brain: No sign of acute stroke, mass, hemorrhage, hydrocephalus or extra-axial collection. Frontal and temporal lobe atrophy, similar to the study of 2 weeks ago. Vascular: There is atherosclerotic calcification of the major vessels at the base of the brain. Skull: Negative Sinuses/Orbits: Clear sinuses. Old medial orbital blowout fractures. No acute orbital finding. Other: None  CT CERVICAL SPINE FINDINGS Alignment: No malalignment. Skull base and vertebrae: No fracture or focal lesion. Distant ACDF C3 through C6 with solid union. Soft tissues and spinal canal: No traumatic soft tissue finding. Disc levels: The foramen magnum is widely patent. There is ordinary mild osteoarthritis of the C1-2 articulation but no encroachment upon the neural structures. C2-3: No stenosis.  Anterior calcification incidentally noted. C3 through C6: Previous ACDF with solid union and sufficient patency of the canal and foramina. C6-7: Degenerative spondylosis with endplate osteophytes. Bilateral foraminal narrowing that could affect either C7 nerve. C7-T1: Mild facet osteoarthritis on the left.  No stenosis. Upper chest: Negative Other: None IMPRESSION: HEAD CT: No acute or traumatic finding. Frontal and temporal lobe atrophy. CERVICAL SPINE CT: No acute or traumatic finding. Previous ACDF C3 through C6 with solid union and sufficient patency of the canal and foramina. Degenerative spondylosis at C6-7 with bilateral foraminal narrowing that could affect either C7 nerve. Electronically Signed   By: Oneil Officer M.D.   On: 02/13/2024 13:12   CT Cervical Spine Wo Contrast Result Date: 02/13/2024 CLINICAL DATA:  Head trauma.  Syncopal episode. EXAM: CT HEAD WITHOUT CONTRAST CT CERVICAL SPINE WITHOUT CONTRAST TECHNIQUE: Multidetector CT imaging of the head and cervical spine was performed following the standard protocol without intravenous contrast. Multiplanar CT image reconstructions of the cervical spine were also generated. RADIATION DOSE REDUCTION: This exam was performed according to the departmental dose-optimization program which includes automated exposure control, adjustment of the mA and/or kV according to patient size and/or use of iterative reconstruction technique. COMPARISON:  01/25/2024 FINDINGS: CT HEAD FINDINGS Brain: No sign of acute stroke, mass, hemorrhage, hydrocephalus or extra-axial  collection. Frontal and temporal lobe atrophy, similar to the study of 2 weeks ago. Vascular: There  is atherosclerotic calcification of the major vessels at the base of the brain. Skull: Negative Sinuses/Orbits: Clear sinuses. Old medial orbital blowout fractures. No acute orbital finding. Other: None CT CERVICAL SPINE FINDINGS Alignment: No malalignment. Skull base and vertebrae: No fracture or focal lesion. Distant ACDF C3 through C6 with solid union. Soft tissues and spinal canal: No traumatic soft tissue finding. Disc levels: The foramen magnum is widely patent. There is ordinary mild osteoarthritis of the C1-2 articulation but no encroachment upon the neural structures. C2-3: No stenosis.  Anterior calcification incidentally noted. C3 through C6: Previous ACDF with solid union and sufficient patency of the canal and foramina. C6-7: Degenerative spondylosis with endplate osteophytes. Bilateral foraminal narrowing that could affect either C7 nerve. C7-T1: Mild facet osteoarthritis on the left.  No stenosis. Upper chest: Negative Other: None IMPRESSION: HEAD CT: No acute or traumatic finding. Frontal and temporal lobe atrophy. CERVICAL SPINE CT: No acute or traumatic finding. Previous ACDF C3 through C6 with solid union and sufficient patency of the canal and foramina. Degenerative spondylosis at C6-7 with bilateral foraminal narrowing that could affect either C7 nerve. Electronically Signed   By: Oneil Officer M.D.   On: 02/13/2024 13:12   Data Reviewed: Relevant notes from primary care and specialist visits, past discharge summaries as available in EHR, including Care Everywhere . Prior diagnostic testing as pertinent to current admission diagnoses, Updated medications and problem lists for reconciliation .ED course, including vitals, labs, imaging, treatment and response to treatment,Triage notes, nursing and pharmacy notes and ED provider's notes.Notable results as noted in HPI.Discussed case with EDMD/ ED  APP/ or Specialty MD on call and as needed.  Assessment & Plan  >> Syncope and collapse: Patient found in the bathroom today similar to his previous episode, patient at bedside denies any complaints of pain or palpitations and does not engage in the conversation at bedside.  We will admit patient for telemetry monitoring for his heart rate along with oxygen saturation, orthostatic vitals when stable and MRI of the brain with 2D echocardiogram.  Most likely I suspect patient's syncope episode to be related to his bradycardia.  Hold patient's metoprolol  and amlodipine .  As needed hydralazine .  >> Bradycardia: Patient found to be bradycardic with EMS chart review shows he has a history of bradycardia, and has seen cardiology and been off of his metoprolol  sisters do not know when it is restarted. We have consulted cardiology for further evaluation TFTs.   >> Right lower lobe pneumonia: Continue with Rocephin  and azithromycin  started in the ED. Continuous pulse oximetry monitor for hypoxia. PRN Albuterol as needed.  >> CKD stage IIIa: Lab Results  Component Value Date   CREATININE 1.33 (H) 02/13/2024   CREATININE 1.70 (H) 01/25/2024   CREATININE 1.19 08/02/2022  Stable, avoid contrast studies and renally dose needed meds. Monitor I's and O's.    >> Anemia:    Latest Ref Rng & Units 02/13/2024   11:26 AM 01/25/2024   10:21 AM 05/03/2022    9:25 AM  CBC  WBC 4.0 - 10.5 K/uL 4.9  7.0  3.7   Hemoglobin 13.0 - 17.0 g/dL 88.1  86.9  87.9   Hematocrit 39.0 - 52.0 % 33.9  37.3  34.2   Platelets 150 - 400 K/uL 203  182  157   Stable hemoglobin of 11.8 will type and screen, IV PPI, stool guaiac.   DVT prophylaxis:  Heparin   Consults:  Cardiology   Advance Care Planning:    Code Status:  Full Code   Family Communication:  Sisters.  Disposition Plan:  Home.  Severity of Illness: The appropriate patient status for this patient is INPATIENT. Inpatient status is judged to be reasonable  and necessary in order to provide the required intensity of service to ensure the patient's safety. The patient's presenting symptoms, physical exam findings, and initial radiographic and laboratory data in the context of their chronic comorbidities is felt to place them at high risk for further clinical deterioration. Furthermore, it is not anticipated that the patient will be medically stable for discharge from the hospital within 2 midnights of admission.   * I certify that at the point of admission it is my clinical judgment that the patient will require inpatient hospital care spanning beyond 2 midnights from the point of admission due to high intensity of service, high risk for further deterioration and high frequency of surveillance required.*  Unresulted Labs (From admission, onward)     Start     Ordered   02/14/24 0500  Occult blood card to lab, stool  Daily,   R      02/13/24 1548   02/13/24 1549  Type and screen  Once,   R        02/13/24 1548   02/13/24 1451  T4, free  Add-on,   AD        02/13/24 1450            Meds ordered this encounter  Medications   lactated ringers  bolus 1,000 mL   iohexol  (OMNIPAQUE ) 350 MG/ML injection 75 mL   cefTRIAXone  (ROCEPHIN ) 2 g in sodium chloride  0.9 % 100 mL IVPB    Antibiotic Indication::   CAP   azithromycin  (ZITHROMAX ) 500 mg in sodium chloride  0.9 % 250 mL IVPB   sodium chloride  flush (NS) 0.9 % injection 3 mL   heparin  injection 5,000 Units   ondansetron  (ZOFRAN ) 8 mg in sodium chloride  0.9 % 50 mL IVPB   pantoprazole  (PROTONIX ) injection 40 mg   hydrALAZINE  (APRESOLINE ) injection 10 mg   azithromycin  (ZITHROMAX ) 500 mg in sodium chloride  0.9 % 250 mL IVPB   cefTRIAXone  (ROCEPHIN ) 2 g in sodium chloride  0.9 % 100 mL IVPB    Antibiotic Indication::   CAP   0.9 %  sodium chloride  infusion     Orders Placed This Encounter  Procedures   CT Head Wo Contrast   CT Cervical Spine Wo Contrast   CT ABDOMEN PELVIS W CONTRAST   MR  BRAIN WO CONTRAST   CBC   Comprehensive metabolic panel   Lipase, blood   TSH   T4, free   Occult blood card to lab, stool   ED Cardiac monitoring   Cardiac monitoring   Maintain IV access   Vital signs   Notify physician (specify)   Refer to Sidebar Report Mobility Protocol for Adult Inpatient   Daily weights   Intake and output   Apply Syncope Care Plan   Initiate Oral Care Protocol   Initiate Carrier Fluid Protocol   Ambulate with assistance   Swallow screen   Strict intake and output   Orthostatic vital signs on admission   Full code   Consult to hospitalist   ED Pulse oximetry, continuous   ED EKG   EKG 12-Lead   EKG 12-Lead   Type and screen   Insert peripheral IV   Insert saline lock   Admit to Inpatient (patient's expected length of stay will be greater than 2 midnights or  inpatient only procedure)   Aspiration precautions   Fall precautions    Author: Mario LULLA Blanch, MD 12 pm- 8 pm. [facility]. 02/13/2024 3:50 PM Please note for any communication after hours contact [facility] Assigned provider on call on Amion.

## 2024-02-14 ENCOUNTER — Inpatient Hospital Stay (HOSPITAL_COMMUNITY)

## 2024-02-14 DIAGNOSIS — R001 Bradycardia, unspecified: Secondary | ICD-10-CM | POA: Diagnosis not present

## 2024-02-14 DIAGNOSIS — R55 Syncope and collapse: Secondary | ICD-10-CM | POA: Diagnosis not present

## 2024-02-14 DIAGNOSIS — J189 Pneumonia, unspecified organism: Secondary | ICD-10-CM

## 2024-02-14 DIAGNOSIS — I10 Essential (primary) hypertension: Secondary | ICD-10-CM | POA: Diagnosis not present

## 2024-02-14 DIAGNOSIS — E785 Hyperlipidemia, unspecified: Secondary | ICD-10-CM | POA: Diagnosis not present

## 2024-02-14 LAB — BASIC METABOLIC PANEL WITH GFR
Anion gap: 12 (ref 5–15)
BUN: 16 mg/dL (ref 8–23)
CO2: 24 mmol/L (ref 22–32)
Calcium: 9 mg/dL (ref 8.9–10.3)
Chloride: 103 mmol/L (ref 98–111)
Creatinine, Ser: 1.73 mg/dL — ABNORMAL HIGH (ref 0.61–1.24)
GFR, Estimated: 41 mL/min — ABNORMAL LOW (ref >60)
Glucose, Bld: 114 mg/dL — ABNORMAL HIGH (ref 70–99)
Potassium: 4.6 mmol/L (ref 3.5–5.1)
Sodium: 139 mmol/L (ref 135–145)

## 2024-02-14 LAB — URINALYSIS, W/ REFLEX TO CULTURE (INFECTION SUSPECTED)
Bacteria, UA: NONE SEEN
Bilirubin Urine: NEGATIVE
Glucose, UA: NEGATIVE mg/dL
Hgb urine dipstick: NEGATIVE
Ketones, ur: NEGATIVE mg/dL
Leukocytes,Ua: NEGATIVE
Nitrite: NEGATIVE
Protein, ur: NEGATIVE mg/dL
Specific Gravity, Urine: 1.032 — ABNORMAL HIGH (ref 1.005–1.030)
pH: 8 (ref 5.0–8.0)

## 2024-02-14 LAB — ECHOCARDIOGRAM COMPLETE
Area-P 1/2: 3.03 cm2
Height: 71 in
S' Lateral: 3.7 cm
Weight: 3246.4 [oz_av]

## 2024-02-14 LAB — CBC
HCT: 32 % — ABNORMAL LOW (ref 39.0–52.0)
Hemoglobin: 11.5 g/dL — ABNORMAL LOW (ref 13.0–17.0)
MCH: 27.8 pg (ref 26.0–34.0)
MCHC: 35.9 g/dL (ref 30.0–36.0)
MCV: 77.3 fL — ABNORMAL LOW (ref 80.0–100.0)
Platelets: 198 K/uL (ref 150–400)
RBC: 4.14 MIL/uL — ABNORMAL LOW (ref 4.22–5.81)
RDW: 14.3 % (ref 11.5–15.5)
WBC: 15.2 K/uL — ABNORMAL HIGH (ref 4.0–10.5)
nRBC: 0 % (ref 0.0–0.2)

## 2024-02-14 LAB — GLUCOSE, CAPILLARY: Glucose-Capillary: 102 mg/dL — ABNORMAL HIGH (ref 70–99)

## 2024-02-14 LAB — LACTIC ACID, PLASMA: Lactic Acid, Venous: 1.7 mmol/L (ref 0.5–1.9)

## 2024-02-14 LAB — MAGNESIUM: Magnesium: 1.6 mg/dL — ABNORMAL LOW (ref 1.7–2.4)

## 2024-02-14 MED ORDER — SODIUM CHLORIDE 0.9 % IV SOLN
3.0000 g | Freq: Three times a day (TID) | INTRAVENOUS | Status: DC
  Administered 2024-02-14 – 2024-02-15 (×4): 3 g via INTRAVENOUS
  Filled 2024-02-14 (×4): qty 8

## 2024-02-14 MED ORDER — MELATONIN 3 MG PO TABS
3.0000 mg | ORAL_TABLET | Freq: Every evening | ORAL | Status: DC | PRN
  Administered 2024-02-14: 3 mg via ORAL
  Filled 2024-02-14: qty 1

## 2024-02-14 MED ORDER — SERTRALINE HCL 50 MG PO TABS
50.0000 mg | ORAL_TABLET | Freq: Every day | ORAL | Status: DC
  Administered 2024-02-14 – 2024-02-15 (×2): 50 mg via ORAL
  Filled 2024-02-14 (×2): qty 1

## 2024-02-14 MED ORDER — MEMANTINE HCL 10 MG PO TABS
10.0000 mg | ORAL_TABLET | Freq: Two times a day (BID) | ORAL | Status: DC
  Administered 2024-02-14 – 2024-02-15 (×3): 10 mg via ORAL
  Filled 2024-02-14 (×3): qty 1

## 2024-02-14 MED ORDER — DONEPEZIL HCL 10 MG PO TABS
10.0000 mg | ORAL_TABLET | Freq: Every day | ORAL | Status: DC
  Administered 2024-02-14: 10 mg via ORAL
  Filled 2024-02-14: qty 1

## 2024-02-14 MED ORDER — SODIUM CHLORIDE 0.9 % IV SOLN: INTRAVENOUS | Status: DC

## 2024-02-14 MED ORDER — MAGNESIUM SULFATE 2 GM/50ML IV SOLN
2.0000 g | Freq: Once | INTRAVENOUS | Status: AC
  Administered 2024-02-14: 2 g via INTRAVENOUS
  Filled 2024-02-14: qty 50

## 2024-02-14 MED ORDER — PANTOPRAZOLE SODIUM 40 MG PO TBEC
40.0000 mg | DELAYED_RELEASE_TABLET | Freq: Every day | ORAL | Status: DC
  Administered 2024-02-14 – 2024-02-15 (×2): 40 mg via ORAL
  Filled 2024-02-14 (×2): qty 1

## 2024-02-14 MED ORDER — ATORVASTATIN CALCIUM 10 MG PO TABS
20.0000 mg | ORAL_TABLET | Freq: Every day | ORAL | Status: DC
  Administered 2024-02-14 – 2024-02-15 (×2): 20 mg via ORAL
  Filled 2024-02-14 (×2): qty 2

## 2024-02-14 NOTE — Evaluation (Signed)
 Clinical/Bedside Swallow Evaluation Patient Details  Name: Dennis Huffman MRN: [id number] Date of Birth: October 09, 1950  Today's Date: 02/14/2024 Time: SLP Start Time (ACUTE ONLY): 1053 SLP Stop Time (ACUTE ONLY): 1058 SLP Time Calculation (min) (ACUTE ONLY): 5 min  Past Medical History:  Past Medical History:  Diagnosis Date   Arthritis    cervical spondylotic, radiculopathy   Bradycardia    a. HR previously reported as 40 at PCP's office; event monitor 2017 showed nocturnal bradycardia but no daytime findings.   CKD (chronic kidney disease), stage II    Hypercholesteremia    Hypertension    Premature atrial contractions    Prostate cancer (HCC)    PVC's (premature ventricular contractions)    Past Surgical History:  Past Surgical History:  Procedure Laterality Date   ANTERIOR CERVICAL DECOMP/DISCECTOMY FUSION N/A 05/12/2015   Procedure: ANTERIOR CERVICAL DECOMPRESSION/DISCECTOMY FUSION C3 - C6 3 LEVELS;  Surgeon: Donaciano Sprang, MD;  Location: [facility] OR;  Service: Orthopedics;  Laterality: N/A;   colonscopy     COLONSCOPY      INSERTION PROSTATE RADIATION SEED  UNSURE OF DATE   DONE BY DR CEIL    PROSTATE BIOPSY     ROTATOR CUFF REPAIR Right 03-13-12; 09-27-15   TOTAL HIP ARTHROPLASTY Left 11/28/2018   Procedure: TOTAL HIP ARTHROPLASTY;  Surgeon: Shari Sieving, MD;  Location: [facility];  Service: Orthopedics;  Laterality: Left;   HPI:  Dennis Huffman is a 73 y.o. male who presented for syncope after family heard patient fall in the bathroom at home, his sister found him unconscious and called 911.  Heart rate was 20s and was given 1 mg of atropine along with normal saline bolus..  Also reports that he has not been eating well. CT Ab 8/21: Stable partially included small hiatal hernia. Interval right lower lobe patchy densities and minimal bibasilar dependent atelectasis.  Pt with past medical history of bradycardia where patient was evaluated by cardiology and attempted for titration  of metoprolol  dose in the past,HTN, aortic atherosclerosis, HLD, CKD, and PACs/PVCs, dementia on Aricept ,    Assessment / Plan / Recommendation  Clinical Impression  Pt presents with grossly normal swallow function as assessed clinically.  Pt tolerated all consistencies trialed without any clinical s/s of aspiration, including large serial straw sips of thin liquid, and exhibited good oral clearance of solids.  Pt denies difficulty eating or changes to appetite despite decreased PO intake at home per chart review.  CT results with right lower lobe patchy densities and minimal bibasilar dependent atelectasis but with with historical chest imaging showing no active disease suggesting no hx recurrent pna. Pt appears safe to continue current diet. SLP will sign off.  Please reconsult ST if there is a change in functional status.  Recommend regular texture diet with thin liquids. SLP Visit Diagnosis: Dysphagia, unspecified (R13.10)    Aspiration Risk  No limitations    Diet Recommendation Regular;Thin liquid    Liquid Administration via: Cup;Straw Medication Administration: Whole meds with liquid Supervision: Patient able to self feed;Staff to assist with self feeding Compensations: Slow rate;Small sips/bites Postural Changes: Seated upright at 90 degrees    Other  Recommendations Oral Care Recommendations: Oral care BID     Assistance Recommended at Discharge    Functional Status Assessment Patient has not had a recent decline in their functional status  Frequency and Duration  (N/A)          Prognosis Prognosis for improved oropharyngeal function:  (N/A)  Swallow Study   General Date of Onset: 02/13/24 HPI: Dennis Huffman is a 73 y.o. male who presented for syncope after family heard patient fall in the bathroom at home, his sister found him unconscious and called 911.  Heart rate was 20s and was given 1 mg of atropine along with normal saline bolus..  Also reports that he has  not been eating well. CT Ab 8/21: Stable partially included small hiatal hernia. Interval right lower lobe patchy densities and minimal bibasilar dependent atelectasis.  Pt with past medical history of bradycardia where patient was evaluated by cardiology and attempted for titration of metoprolol  dose in the past,HTN, aortic atherosclerosis, HLD, CKD, and PACs/PVCs, dementia on Aricept , Type of Study: Bedside Swallow Evaluation Previous Swallow Assessment: none Diet Prior to this Study: Regular;Thin liquids (Level 0) Temperature Spikes Noted: No History of Recent Intubation: No Behavior/Cognition: Alert;Cooperative;Pleasant mood Oral Cavity Assessment: Within Functional Limits Oral Care Completed by SLP: No Oral Cavity - Dentition: Dentures, top (pt reports natural lower dentition) Vision: Functional for self-feeding Patient Positioning: Upright in bed Baseline Vocal Quality: Normal Volitional Cough: Strong Volitional Swallow: Able to elicit    Oral/Motor/Sensory Function Overall Oral Motor/Sensory Function: Within functional limits   Ice Chips Ice chips: Not tested   Thin Liquid Thin Liquid: Within functional limits Presentation: Straw    Nectar Thick Nectar Thick Liquid: Not tested   Honey Thick Honey Thick Liquid: Not tested   Puree Puree: Within functional limits Presentation: Spoon   Solid     Solid: Within functional limits Presentation: Self Fed      Anette FORBES Grippe, MA, CCC-SLP Acute Rehabilitation Services Office: [phone] 02/14/2024,11:18 AM

## 2024-02-14 NOTE — Evaluation (Signed)
 Occupational Therapy Evaluation Patient Details Name: Dennis Huffman MRN: [id number] DOB: 08-17-1950 Today's Date: 02/14/2024   History of Present Illness   73 yo male adm 02/13/24 with syncope and bradycardia. PMhx; dementia, HLD, bradycardia, prostate CA, HTN, CKD, Lt THA, Rt RCR, ACDF     Clinical Impressions Patient just seen by PT and discharged.  Patient admitted for the diagnosis above.  PTA he lives at home with his elderly mother and sister.  Patient has needed assist for medications, iADL and meals at home.  Patient not using an  AD for mobility, and not needing any assist for ADL completion from a sit to stand level.  No significant OT needs exist in the acute setting, and no post acute OT is indicated.  Mobility team referred for continued OOB and in hall ambulation.       If plan is discharge home, recommend the following:   Assist for transportation;Direct supervision/assist for medications management     Functional Status Assessment   Patient has not had a recent decline in their functional status     Equipment Recommendations   None recommended by OT     Recommendations for Other Services         Precautions/Restrictions   Precautions Precautions: None Restrictions Weight Bearing Restrictions Per Provider Order: No     Mobility Bed Mobility               General bed mobility comments: up in recliner    Transfers Overall transfer level: Modified independent                 General transfer comment: assist with lines      Balance Overall balance assessment: No apparent balance deficits (not formally assessed)                                         ADL either performed or assessed with clinical judgement   ADL Overall ADL's : At baseline                                             Vision Patient Visual Report: No change from baseline       Perception Perception: Not tested        Praxis Praxis: Not tested       Pertinent Vitals/Pain Pain Assessment Pain Assessment: No/denies pain Pain Intervention(s): Monitored during session     Extremity/Trunk Assessment Upper Extremity Assessment Upper Extremity Assessment: Overall WFL for tasks assessed   Lower Extremity Assessment Lower Extremity Assessment: Defer to PT evaluation   Cervical / Trunk Assessment Cervical / Trunk Assessment: Normal   Communication Communication Communication: No apparent difficulties   Cognition Arousal: Alert Behavior During Therapy: WFL for tasks assessed/performed Cognition: History of cognitive impairments             OT - Cognition Comments: Follows commands easily, conversational.  Patient stating he still drives locally                 Following commands: Intact       Cueing  General Comments   Cueing Techniques: Verbal cues   VSS on RA   Exercises     Shoulder Instructions      Home Living Family/patient expects to  be discharged to:: Private residence Living Arrangements: Parent;Other relatives (Sister) Available Help at Discharge: Family;Available 24 hours/day Type of Home: House Home Access: Level entry     Home Layout: Two level;Able to live on main level with bedroom/bathroom     Bathroom Shower/Tub: Tub/shower unit;Walk-in shower   Bathroom Toilet: Standard     Home Equipment: None   Additional Comments: Per PT eval, confirmed: states he lives with 94yo mom and sisters      Prior Functioning/Environment Prior Level of Function : Needs assist             Mobility Comments: Ind at baseline ADLs Comments: Patient stating Ind with ADL and toileting.  Sister assists with iADL and meals.  Daughter assists with medications.    OT Problem List: Impaired balance (sitting and/or standing)   OT Treatment/Interventions:        OT Goals(Current goals can be found in the care plan section)   Acute Rehab OT Goals Patient  Stated Goal: Return home OT Goal Formulation: With patient Time For Goal Achievement: 02/21/24 Potential to Achieve Goals: Good   OT Frequency:       Co-evaluation              AM-PAC OT 6 Clicks Daily Activity     Outcome Measure Help from another person eating meals?: None Help from another person taking care of personal grooming?: None Help from another person toileting, which includes using toliet, bedpan, or urinal?: None Help from another person bathing (including washing, rinsing, drying)?: None Help from another person to put on and taking off regular upper body clothing?: None Help from another person to put on and taking off regular lower body clothing?: None 6 Click Score: 24   End of Session Nurse Communication: Mobility status  Activity Tolerance: Patient tolerated treatment well Patient left: in chair;with call bell/phone within reach  OT Visit Diagnosis: Unsteadiness on feet (R26.81)                Time: 8769-8750 OT Time Calculation (min): 19 min Charges:  OT General Charges $OT Visit: 1 Visit OT Evaluation $OT Eval Moderate Complexity: 1 Mod  02/14/2024  RP, OTR/L  Acute Rehabilitation Services  Office:  [phone]   Dennis Huffman 02/14/2024, 3:44 PM

## 2024-02-14 NOTE — Progress Notes (Signed)
 SLP Cancellation Note  Patient Details Name: DERRAL COLUCCI MRN: [id number] DOB: 04-19-1951   Cancelled treatment:        Attempted to see pt for swallowing assessment.  Pt transporting off floor for procedure.  SLP will reattempt as schedule permits.   Anette FORBES Grippe, MA, CCC-SLP Acute Rehabilitation Services Office: [phone] 02/14/2024, 8:39 AM

## 2024-02-14 NOTE — Evaluation (Signed)
 Physical Therapy Brief Evaluation and Discharge Note Patient Details Name: Dennis Huffman MRN: [id number] DOB: February 07, 1951 Today's Date: 02/14/2024   History of Present Illness  73 yo male adm 02/13/24 with syncope and bradycardia. PMhx; dementia, HLD, bradycardia, prostate CA, HTN, CKD, Lt THA, Rt RCR, ACDF  Clinical Impression  Pt pleasant and able to perform all basic transfers and gait without assist. PT reports family assist at home, no stairs and that he is still driving. HR 54-98 during activity with pt stable throughout. Pt at baseline functional status without further therapy needs at this time, will sign off. Encouraged daily ambulation acutely.        PT Assessment Patient does not need any further PT services  Assistance Needed at Discharge  Set up Supervision/Assistance    Equipment Recommendations None recommended by PT  Recommendations for Other Services       Precautions/Restrictions Precautions Precautions: None        Mobility  Bed Mobility Rolling: Modified independent (Device/Increase time)        Transfers Overall transfer level: Modified independent                      Ambulation/Gait Ambulation/Gait assistance: Supervision Gait Distance (Feet): 500 Feet Assistive device: None Gait Pattern/deviations: WFL(Within Functional Limits) Gait Speed: Pace WFL General Gait Details: supervision for lines and direction  Home Activity Instructions    Stairs            Modified Rankin (Stroke Patients Only)        Balance Overall balance assessment: No apparent balance deficits (not formally assessed)                        Pertinent Vitals/Pain PT - Brief Vital Signs All Vital Signs Stable: Yes Pain Assessment Pain Assessment: No/denies pain     Home Living Family/patient expects to be discharged to:: Private residence Living Arrangements: Parent;Other relatives Available Help at Discharge: Family;Available 24  hours/day Home Environment: Level entry   Home Equipment: None   Additional Comments: states he lives with 94yo mom and sisters    Prior Function Level of Independence: Independent Comments: pt reports independence and still driving with mom paying bills    UE/LE Assessment   UE ROM/Strength/Tone/Coordination: WFL    LE ROM/Strength/Tone/Coordination: [facility]      Communication   Communication Communication: No apparent difficulties     Cognition Overall Cognitive Status: No family/caregiver present to determine baseline cognitive functioning       General Comments      Exercises     Assessment/Plan    PT Problem List         PT Visit Diagnosis Other abnormalities of gait and mobility (R26.89)    No Skilled PT All education completed;Patient at baseline level of functioning   Co-evaluation                AMPAC 6 Clicks Help needed turning from your back to your side while in a flat bed without using bedrails?: None Help needed moving from lying on your back to sitting on the side of a flat bed without using bedrails?: None Help needed moving to and from a bed to a chair (including a wheelchair)?: None Help needed standing up from a chair using your arms (e.g., wheelchair or bedside chair)?: None Help needed to walk in hospital room?: A Little Help needed climbing 3-5 steps with a railing? : A Little 6  Click Score: 22      End of Session Equipment Utilized During Treatment: Gait belt Activity Tolerance: Patient tolerated treatment well Patient left: in chair;with chair alarm set;with call bell/phone within reach Nurse Communication: Mobility status PT Visit Diagnosis: Other abnormalities of gait and mobility (R26.89)     Time: 1130-1145 PT Time Calculation (min) (ACUTE ONLY): 15 min  Charges:   PT Evaluation $PT Eval Low Complexity: 1 Low      Jawad Wiacek P, PT Acute Rehabilitation Services Office: [phone]   Lenoard NOVAK Bernese Doffing  02/14/2024,  12:06 PM

## 2024-02-14 NOTE — Progress Notes (Signed)
  Echocardiogram 2D Echocardiogram has been performed.  Koleen KANDICE Popper, RDCS 02/14/2024, 9:18 AM

## 2024-02-14 NOTE — Progress Notes (Signed)
 Pharmacy Antibiotic Note  Dennis Huffman is a 73 y.o. male admitted on 02/13/2024 with pneumonia.  Pharmacy has been consulted for Unasyn  dosing.  Plan: Unasyn  3g IV q 8 hrs  Height: 5' 11 (180.3 cm) Weight: 92 kg (202 lb 14.4 oz) IBW/kg (Calculated) : 75.3  Temp (24hrs), Avg:99.6 F (37.6 C), Min:98.4 F (36.9 C), Max:101.6 F (38.7 C)  Recent Labs  Lab 02/13/24 1126 02/13/24 1134 02/13/24 2345 02/14/24 0138  WBC 4.9  --   --  15.2*  CREATININE  --  1.33*  --  1.73*  LATICACIDVEN  --   --  1.7  --     Estimated Creatinine Clearance: 42.8 mL/min (A) (by C-G formula based on SCr of 1.73 mg/dL (H)).    No Active Allergies  Antimicrobials this admission:  Azithro 8/21 Ceftriaxone  8/21 Unasyn  8/22 >   Dose adjustments this admission:   Microbiology results:  8/22 BCx x 2 >   Thank you for allowing pharmacy to be a part of this patient's care.  Harlene Barlow, [employer], [facility] Clinical Pharmacist  02/14/2024 10:55 AM   [facility] pharmacy phone numbers are listed on [email or web address]

## 2024-02-14 NOTE — Progress Notes (Signed)
 MEWS Progress Note  Patient Details Name: Dennis Huffman MRN: [id number] DOB: 01-Sep-1950 Today's Date: 02/14/2024   MEWS Flowsheet Documentation:  Assess: MEWS Score Temp: 98.9 F (37.2 C) BP: (!) 111/51 MAP (mmHg): 69 Pulse Rate: 61 ECG Heart Rate: 61 Resp: 18 Level of Consciousness: Alert SpO2: 98 % O2 Device: Room Air Patient Activity (if Appropriate): In bed Assess: MEWS Score MEWS Temp: 0 MEWS Systolic: 0 MEWS Pulse: 0 MEWS RR: 0 MEWS LOC: 0 MEWS Score: 0 MEWS Score Color: Green Assess: SIRS CRITERIA SIRS Temperature : 0 SIRS Respirations : 0 SIRS Pulse: 0 SIRS WBC: 1 SIRS Score Sum : 1 SIRS Temperature : 0 SIRS Pulse: 0 SIRS Respirations : 0 SIRS WBC: 1 SIRS Score Sum : 1 Assess: if the MEWS score is Yellow or Red Were vital signs accurate and taken at a resting state?: Yes Does the patient meet 2 or more of the SIRS criteria?: No MEWS guidelines implemented : Yes, yellow Treat MEWS Interventions: Considered administering scheduled or prn medications/treatments as ordered Take Vital Signs Increase Vital Sign Frequency : Yellow: Q2hr x1, continue Q4hrs until patient remains green for 12hrs Escalate MEWS: Escalate: Yellow: Discuss with charge nurse and consider notifying provider and/or RRT        [name] 02/14/2024, 7:24 AM

## 2024-02-14 NOTE — Hospital Course (Signed)
 Patient with PMH of HTN, HLD, prostate cancer, CKD stage II-IIIa, chronic bradycardia present to the hospital with complaints of passing out event. Patient was found down on the ground in his bathroom by family. EMS found that the heart rate was in 20s. After receiving atropine patient was still bradycardic but now with sinus bradycardia. Cardiology consulted.  Assessment and Plan: Syncope and collapse. Extensive workup including a CT scan of the head, C-spine, MRI brain negative for any acute abnormality intracranially. No focal deficit at the time of my evaluation. Associated with bradycardia. Cardiology consulted. Echo shows EF 6065%, no significant valvular abnormality. On telemetry so far has bradycardia but no significant blockage. Avoid AV nodal blocking. Currently no plan for pacemaker.  Outpatient 30-day monitoring recommended.  Bilateral likely aspiration pneumonia. CT scan shows evidence of possible bilateral infiltrate. Suspect aspiration in the setting of his syncope. Switching antibiotics to Unasyn . Incentive spirometry. Monitor.  HTN. Blood pressure soft. Holding amlodipine  and losartan . Patient is not orthostatic for now.  HLD. Continue statin. . Dementia. Mood disorder Able to follow commands.  No delirium for now. Continue Namenda  as well as Aricept . On Zoloft  which I will continue as well.  AKI on CKD stage II-IIIa Suspect that his baseline GFR is around 60. On admission serum creatinine was 1.3 although early August serum creatinine was 1.7. Currently is worsening. Suspect this is in the setting of infection. Monitor for now.  Mild hypomagnesemia. Currently being treated.  Chronic bicytopenia. Patient has low WBC count as well as anemia. Outpatient follow-up with PCP recommended.

## 2024-02-14 NOTE — Progress Notes (Signed)
 [facility] Progress Note Patient: Dennis Huffman FMW:[id number] DOB: [birth year] DOA: 02/13/2024  DOS: the patient was seen and examined on 02/14/2024  Brief Hospital Course: Patient with PMH of HTN, HLD, prostate cancer, CKD stage II-IIIa, chronic bradycardia present to the hospital with complaints of passing out event. Patient was found down on the ground in his bathroom by family. EMS found that the heart rate was in 20s. After receiving atropine patient was still bradycardic but now with sinus bradycardia. Cardiology consulted.  Assessment and Plan: Syncope and collapse. Extensive workup including a CT scan of the head, C-spine, MRI brain negative for any acute abnormality intracranially. No focal deficit at the time of my evaluation. Associated with bradycardia. Cardiology consulted. Echo shows EF 6065%, no significant valvular abnormality. On telemetry so far has bradycardia but no significant blockage. Avoid AV nodal blocking. Currently no plan for pacemaker.  Outpatient 30-day monitoring recommended.  Bilateral likely aspiration pneumonia. CT scan shows evidence of possible bilateral infiltrate. Suspect aspiration in the setting of his syncope. Switching antibiotics to Unasyn . Incentive spirometry. Monitor.  HTN. Blood pressure soft. Holding amlodipine  and losartan . Patient is not orthostatic for now.  HLD. Continue statin. . Dementia. Mood disorder Able to follow commands.  No delirium for now. Continue Namenda  as well as Aricept . On Zoloft  which I will continue as well.  AKI on CKD stage II-IIIa Suspect that his baseline GFR is around 60. On admission serum creatinine was 1.3 although early August serum creatinine was 1.7. Currently is worsening. Suspect this is in the setting of infection. Monitor for now.  Mild hypomagnesemia. Currently being treated.  Chronic bicytopenia. Patient has low WBC count as well as anemia. Outpatient follow-up with PCP  recommended.   Subjective: No nausea no vomiting no fever no chills.  Feeling better.  Breathing better.  No chest pain.  Physical Exam: Basilar crackles. S1-S2 present Bowel sound present No edema. Alert awake and oriented to self and place.  No focal deficit.  Data Reviewed: I have Reviewed nursing notes, Vitals, and Lab results. Since last encounter, pertinent lab results CBC and BMP   . I have ordered test including CBC and BMP  .  Disposition: Status is: Inpatient Remains inpatient appropriate because: Monitor for improvement in blood pressure and renal function as well as mentation and pneumonia  heparin  injection 5,000 Units Start: 02/13/24 2200   Family Communication: Family at bedside Level of care: Progressive   Vitals:   02/14/24 0641 02/14/24 0804 02/14/24 1105 02/14/24 1627  BP:  (!) 112/57 118/65 (!) 143/73  Pulse:  (!) 52 (!) 52 65  Resp:  15 16 18   Temp:  98.8 F (37.1 C) 98.5 F (36.9 C) 98.3 F (36.8 C)  TempSrc:  Oral Oral Oral  SpO2:  100% 98% 100%  Weight: 92 kg     Height:         Author: Yetta Blanch, MD 02/14/2024 6:43 PM  Please look on [email or web address] to find out who is on call.

## 2024-02-14 NOTE — Plan of Care (Deleted)

## 2024-02-14 NOTE — Progress Notes (Signed)
  Progress Note  Patient Name: Dennis Huffman Date of Encounter: 02/14/2024 [facility] Cardiologist: None   Interval Summary   Pending echocardiogram this AM Similar telemetry to yesterday  Patient denies any episodes of syncope, presyncope, lightheadedness Has been in the bed since admission Family present and agrees that he has not reported any acute complaints  Vital Signs Vitals:   02/14/24 0148 02/14/24 0455 02/14/24 0641 02/14/24 0804  BP: (!) 96/51 (!) 111/51  (!) 112/57  Pulse:  61  (!) 52  Resp: 20 18  15   Temp:  98.9 F (37.2 C)  98.8 F (37.1 C)  TempSrc:  Rectal  Oral  SpO2:  98%  100%  Weight:   92 kg   Height:        Intake/Output Summary (Last 24 hours) at 02/14/2024 0923 Last data filed at 02/14/2024 0455 Gross per 24 hour  Intake 2051.78 ml  Output 350 ml  Net 1701.78 ml      02/14/2024    6:41 AM 02/13/2024   11:38 AM 09/10/2023   12:49 PM  Last 3 Weights  Weight (lbs) 202 lb 14.4 oz 174 lb 215 lb 8 oz  Weight (kg) 92.035 kg 78.926 kg 97.75 kg     Telemetry/ECG  Sinus bradycardia, HR 50s - Personally Reviewed  Physical Exam  GEN: No acute distress.   Neck: No JVD Cardiac: bradycardic, no murmurs, rubs, or gallops.  Respiratory: Clear to auscultation bilaterally. GI: Soft, nontender, non-distended  MS: No edema  Assessment & Plan   Bradycardia Syncope  Presented to ER after being found down in bathroom by family  Given atropine by EMS for reported HR of 20s, cannot verify this with strips HR appears to be 50-70s Mag low, 1.6 today, supplementing  Pending echocardiogram May benefit from monitor at discharge for syncope  Pending orthostatic VS   Hypertension Potential issue with medication compliance due to baseline dementia  Daughter is now typically handling his medications  Holding home meds at this time  Most recent BP 112/57 Continue to hold home medications   Hyperlipidemia  Continue Lipitor 20 mg daily   Per  primary RLL pneumonia CKD 3a Dementia    For questions or updates, please contact [facility] Please consult [email or web address] for contact info under       Signed, Dennis DELENA Donath, PA-C

## 2024-02-15 ENCOUNTER — Other Ambulatory Visit: Payer: Self-pay | Admitting: Physician Assistant

## 2024-02-15 DIAGNOSIS — R55 Syncope and collapse: Secondary | ICD-10-CM

## 2024-02-15 DIAGNOSIS — R001 Bradycardia, unspecified: Secondary | ICD-10-CM

## 2024-02-15 LAB — BASIC METABOLIC PANEL WITH GFR
Anion gap: 7 (ref 5–15)
BUN: 11 mg/dL (ref 8–23)
CO2: 23 mmol/L (ref 22–32)
Calcium: 8.4 mg/dL — ABNORMAL LOW (ref 8.9–10.3)
Chloride: 107 mmol/L (ref 98–111)
Creatinine, Ser: 1.42 mg/dL — ABNORMAL HIGH (ref 0.61–1.24)
GFR, Estimated: 52 mL/min — ABNORMAL LOW (ref >60)
Glucose, Bld: 91 mg/dL (ref 70–99)
Potassium: 4 mmol/L (ref 3.5–5.1)
Sodium: 137 mmol/L (ref 135–145)

## 2024-02-15 LAB — MAGNESIUM: Magnesium: 2 mg/dL (ref 1.7–2.4)

## 2024-02-15 MED ORDER — SODIUM CHLORIDE 0.9% FLUSH
3.0000 mL | INTRAVENOUS | Status: DC | PRN
  Administered 2024-02-15: 3 mL via INTRAVENOUS

## 2024-02-15 MED ORDER — AMOXICILLIN-POT CLAVULANATE 500-125 MG PO TABS: 1.0000 | ORAL_TABLET | Freq: Three times a day (TID) | ORAL | 0 refills | Status: AC

## 2024-02-15 MED ORDER — SODIUM CHLORIDE 0.9% FLUSH
3.0000 mL | Freq: Two times a day (BID) | INTRAVENOUS | Status: DC
  Administered 2024-02-15: 3 mL via INTRAVENOUS

## 2024-02-15 MED ORDER — SODIUM CHLORIDE 0.9 % IV SOLN: 250.0000 mL | INTRAVENOUS | Status: DC | PRN

## 2024-02-15 NOTE — Progress Notes (Signed)
 Increased restlessness & slept very little throughout shift. No improvement in sleep after administration of HS Melatonin

## 2024-02-15 NOTE — Progress Notes (Signed)
  Progress Note  Patient Name: Dennis Huffman Date of Encounter: 02/15/2024 [facility] Cardiologist: Annabella Scarce, MD   Interval Summary   Overnight monitor shows sinus rhythm/sinus bradycardia in the 40-50's. Holding AVN blocking agents. Echo yesterday showed LVEF 60-65%, dilated aorta to 42 mm.  Vital Signs Vitals:   02/14/24 2036 02/14/24 2343 02/15/24 0411 02/15/24 0800  BP: 122/62 107/65 130/65 116/65  Pulse: (!) 55 (!) 58 (!) 58 (!) 48  Resp: 18 20 18 14   Temp: 98.6 F (37 C) 98.7 F (37.1 C) 98.5 F (36.9 C) 98.7 F (37.1 C)  TempSrc: Oral Oral Oral Oral  SpO2: 98% 97% 98% 100%  Weight:   92.3 kg   Height:        Intake/Output Summary (Last 24 hours) at 02/15/2024 0948 Last data filed at 02/15/2024 0803 Gross per 24 hour  Intake 2398.74 ml  Output --  Net 2398.74 ml      02/15/2024    4:11 AM 02/14/2024    6:41 AM 02/13/2024   11:38 AM  Last 3 Weights  Weight (lbs) 203 lb 8 oz 202 lb 14.4 oz 174 lb  Weight (kg) 92.307 kg 92.035 kg 78.926 kg     Telemetry/ECG  Sinus bradycardia, HR 50s - Personally Reviewed  Physical Exam  GEN: No acute distress.   Neck: No JVD Cardiac: bradycardic, no murmurs, rubs, or gallops.  Respiratory: Clear to auscultation bilaterally. GI: Soft, nontender, non-distended  MS: No edema  Assessment & Plan   Bradycardia Syncope  Presented to ER after being found down in bathroom by family  Given atropine by EMS for reported HR of 20s, cannot verify this with strips HR appears to be 40-500s Echo reassuring with normal LVEF 30 day monitor at DC Labs improved today- creatinine lower at 1.42, magnesium  and potassium 2.0 and 4.0, respectively  Hypertension Potential issue with medication compliance due to baseline dementia  Daughter is now typically handling his medications  Holding home meds at this time  Most recent BP 112/57 Continue to hold home medications  BP in normal range overnight  Hyperlipidemia   Continue Lipitor 20 mg daily   Per primary RLL pneumonia CKD 3a Dementia   No further suggestions at this time. Cardiology will sign-off. Probably could d/c today from our standpoint.  [facility] will sign off.   Medication Recommendations:  as above Other recommendations (labs, testing, etc):  will arrange for 30 day monitor Follow up as an outpatient:  Dr. Scarce or APP   For questions or updates, please contact [facility] Please consult [email or web address] for contact info under   Vinie KYM Maxcy, MD, [facility], FNLA, FACP  [place]  [facility]  Medical Director of the [facility]  [facility] Diplomate of the American Board of Clinical Lipidology Attending Cardiologist  Direct Dial: [phone]  Fax: [phone]  Website:  [email or web address]  Vinie JAYSON Maxcy, MD

## 2024-02-15 NOTE — Plan of Care (Signed)

## 2024-02-15 NOTE — Progress Notes (Signed)
 Pt with hx Dementia.  Med list completed, printed and placed in chart.  Tonya RN updated to review and update as needed.  RN will discharge when sister arrives.

## 2024-02-16 NOTE — Discharge Summary (Signed)
 Physician Discharge Summary   Patient: Dennis Huffman MRN: [id number] DOB: 22-Oct-1950  Admit date:     02/13/2024  Discharge date: 02/15/2024  Discharge Physician: Yetta Blanch  PCP: Ransom Other, MD  Recommendations at discharge: Follow-up with PCP in 1 week.  Follow-up with cardiology as recommended. Repeat CBC and BMP in 1 week. Cardiology office arranging 30-day heart monitor.   Follow-up Information     Ransom Other, MD. Schedule an appointment as soon as possible for a visit in 1 week(s).   Specialty: Internal Medicine Why: with BMP lab to look at kidney/electrolyte numbers, with CBC lab to look at blood counts Contact information: [address], [address] [place] [address]         [facility] at [employer] [facility] [employer]. [employer] [employer]. Call.   Specialty: Cardiology Why: office should call you for heart monitor. Contact information: [address] [place]  [address] [phone]        Vannie Reche RAMAN, NP Follow up on 03/23/2024.   Specialty: Cardiology Why: Hospital Follow Up,  Appt Time: 10:55 am,  Please arrive 15 min early. Bring all medications. Contact information: [address] [place] [address] [phone]                Discharge Diagnoses: Principal Problem:   Syncope and collapse Active Problems:   Pneumonia due to infectious organism  Hospital Course: Patient with PMH of HTN, HLD, prostate cancer, CKD stage II-IIIa, chronic bradycardia present to the hospital with complaints of passing out event. Patient was found down on the ground in his bathroom by family. EMS found that the heart rate was in 20s. After receiving atropine patient was still bradycardic but now with sinus bradycardia. Cardiology consulted.  Assessment and Plan: Syncope and collapse. Extensive workup including a CT scan of the head, C-spine, MRI brain negative for any acute abnormality intracranially. No focal  deficit at the time of my evaluation. Associated with bradycardia. Cardiology consulted. Echo shows EF 6065%, no significant valvular abnormality. On telemetry so far has bradycardia but no significant blockage. Avoid AV nodal blocking. Currently no plan for pacemaker.  Outpatient 30-day monitoring recommended.  Bilateral likely aspiration pneumonia. CT scan shows evidence of possible bilateral infiltrate. Suspect aspiration in the setting of his syncope. Doing well with Unasyn .  Switch to oral Augmentin . Incentive spirometry. Monitor.  HTN. Blood pressure stable.  Orthostatic vitals are negative. Holding amlodipine  and losartan .  HLD. Continue statin. . Dementia. Mood disorder Able to follow commands.  No delirium for now. Continue Namenda  as well as Aricept . On Zoloft  which I will continue as well.  AKI on CKD stage II-IIIa Suspect that his baseline GFR is around 60. On admission serum creatinine was 1.3 although early August serum creatinine was 1.7.  Worsened to 1.7 and now improving. Monitor for now.  Mild hypomagnesemia. Currently being treated.  Chronic bicytopenia. Patient has low WBC count as well as anemia. Outpatient follow-up with PCP recommended.  Consultants:  Cardiology  Procedures performed:  None  DISCHARGE MEDICATION: Allergies as of 02/15/2024   No Active Allergies      Medication List     STOP taking these medications    amLODipine  5 MG tablet Commonly known as: NORVASC    losartan  100 MG tablet Commonly known as: COZAAR    metoprolol  succinate 50 MG 24 hr tablet Commonly known as: TOPROL -XL       TAKE these medications    acetaminophen  650  MG CR tablet Commonly known as: TYLENOL  Take 650-1,300 mg by mouth every 8 (eight) hours as needed for pain.   amoxicillin -clavulanate 500-125 MG tablet Commonly known as: Augmentin  Take 1 tablet by mouth 3 (three) times daily for 5 days.   atorvastatin  20 MG tablet Commonly known as:  LIPITOR Take 20 mg by mouth daily.   donepezil  10 MG tablet Commonly known as: ARICEPT  Take 1 tablet (10 mg total) by mouth at bedtime.   memantine  10 MG tablet Commonly known as: NAMENDA  Take 1 tablet (10 mg total) by mouth 2 (two) times daily. Begin this prescription after the 5 mg is complete   pantoprazole  40 MG tablet Commonly known as: PROTONIX  Take 40 mg by mouth daily.   sertraline  50 MG tablet Commonly known as: ZOLOFT  Take 50 mg by mouth daily.       Disposition: Home Diet recommendation: Cardiac diet  Discharge Exam: Vitals:   02/14/24 2036 02/14/24 2343 02/15/24 0411 02/15/24 0800  BP: 122/62 107/65 130/65 116/65  Pulse: (!) 55 (!) 58 (!) 58 (!) 48  Resp: 18 20 18 14   Temp: 98.6 F (37 C) 98.7 F (37.1 C) 98.5 F (36.9 C) 98.7 F (37.1 C)  TempSrc: Oral Oral Oral Oral  SpO2: 98% 97% 98% 100%  Weight:   92.3 kg   Height:       Clear to auscultation. S1-S2 present reaction bowel sound present No edema. No focal deficit.  Filed Weights   02/13/24 1138 02/14/24 0641 02/15/24 0411  Weight: 78.9 kg 92 kg 92.3 kg   Condition at discharge: stable  The results of significant diagnostics from this hospitalization (including imaging, microbiology, ancillary and laboratory) are listed below for reference.   Imaging Studies: ECHOCARDIOGRAM COMPLETE Result Date: 02/14/2024    ECHOCARDIOGRAM REPORT   Patient Name:   Dennis Huffman Date of Exam: 02/14/2024 Medical Rec #:  [id number]        Height:       71.0 in Accession #:    [id number]       Weight:       202.9 lb Date of Birth:  01-07-1951         BSA:          2.122 m Patient Age:    73 years         BP:           111/51 mmHg Patient Gender: M                HR:           49 bpm. Exam Location:  Inpatient Procedure: 2D Echo, Cardiac Doppler, Color Doppler and Strain Analysis (Both            Spectral and Color Flow Doppler were utilized during procedure). Indications:    Syncope R55  History:        Patient has  prior history of Echocardiogram examinations, most                 recent 07/15/2019. Arrythmias:Bradycardia,                 Signs/Symptoms:Syncope; Risk Factors:Hypertension and                 Dyslipidemia.  Sonographer:    Koleen Popper RDCS Referring Phys: LEONTINE LOISE SALEN  Sonographer Comments: Global longitudinal strain was attempted. IMPRESSIONS  1. Left ventricular ejection fraction, by estimation, is 60 to 65%. The left ventricle has  normal function. The left ventricle has no regional wall motion abnormalities. There is mild left ventricular hypertrophy. Left ventricular diastolic parameters were normal. The average left ventricular global longitudinal strain is -15.8 %. The global longitudinal strain is normal.  2. Right ventricular systolic function is normal. The right ventricular size is normal. There is normal pulmonary artery systolic pressure.  3. The mitral valve is grossly normal. Trivial mitral valve regurgitation. No evidence of mitral stenosis.  4. The aortic valve is tricuspid. There is mild calcification of the aortic valve. Aortic valve regurgitation is not visualized. Aortic valve sclerosis is present, with no evidence of aortic valve stenosis.  5. Aortic dilatation noted. There is mild dilatation of the ascending aorta, measuring 42 mm.  6. The inferior vena cava is normal in size with greater than 50% respiratory variability, suggesting right atrial pressure of 3 mmHg. Comparison(s): No significant change from prior study. Conclusion(s)/Recommendation(s): Otherwise normal echocardiogram, with minor abnormalities described in the report. FINDINGS  Left Ventricle: Left ventricular ejection fraction, by estimation, is 60 to 65%. The left ventricle has normal function. The left ventricle has no regional wall motion abnormalities. The average left ventricular global longitudinal strain is -15.8 %. Strain was performed and the global longitudinal strain is normal. The left ventricular internal  cavity size was normal in size. There is mild left ventricular hypertrophy. Left ventricular diastolic parameters were normal. Right Ventricle: The right ventricular size is normal. Right vetricular wall thickness was not well visualized. Right ventricular systolic function is normal. There is normal pulmonary artery systolic pressure. The tricuspid regurgitant velocity is 2.12 m/s, and with an assumed right atrial pressure of 3 mmHg, the estimated right ventricular systolic pressure is 21.0 mmHg. Left Atrium: Left atrial size was normal in size. Right Atrium: Right atrial size was normal in size. Pericardium: There is no evidence of pericardial effusion. Presence of epicardial fat layer. Mitral Valve: The mitral valve is grossly normal. Trivial mitral valve regurgitation. No evidence of mitral valve stenosis. Tricuspid Valve: The tricuspid valve is grossly normal. Tricuspid valve regurgitation is trivial. No evidence of tricuspid stenosis. Aortic Valve: The aortic valve is tricuspid. There is mild calcification of the aortic valve. Aortic valve regurgitation is not visualized. Aortic valve sclerosis is present, with no evidence of aortic valve stenosis. Pulmonic Valve: The pulmonic valve was not well visualized. Pulmonic valve regurgitation is not visualized. No evidence of pulmonic stenosis. Aorta: Aortic dilatation noted. There is mild dilatation of the ascending aorta, measuring 42 mm. Venous: The inferior vena cava is normal in size with greater than 50% respiratory variability, suggesting right atrial pressure of 3 mmHg. IAS/Shunts: The atrial septum is grossly normal.  LEFT VENTRICLE PLAX 2D LVIDd:         5.20 cm   Diastology LVIDs:         3.70 cm   LV e' medial:    11.30 cm/s LV PW:         1.10 cm   LV E/e' medial:  7.3 LV IVS:        1.40 cm   LV e' lateral:   13.80 cm/s LVOT diam:     2.20 cm   LV E/e' lateral: 6.0 LV SV:         90 LV SV Index:   42        2D Longitudinal Strain LVOT Area:     3.80 cm   2D Strain GLS (A4C):   -15.8 %  2D Strain GLS (A3C):   -12.9 %                          2D Strain GLS (A2C):   -18.7 %                          2D Strain GLS Avg:     -15.8 % RIGHT VENTRICLE             IVC RV Basal diam:  3.80 cm     IVC diam: 1.80 cm RV S prime:     14.90 cm/s TAPSE (M-mode): 2.2 cm LEFT ATRIUM             Index        RIGHT ATRIUM           Index LA diam:        3.40 cm 1.60 cm/m   RA Area:     17.10 cm LA Vol (A2C):   36.0 ml 16.97 ml/m  RA Volume:   41.60 ml  19.61 ml/m LA Vol (A4C):   23.5 ml 11.08 ml/m LA Biplane Vol: 29.7 ml 14.00 ml/m  AORTIC VALVE LVOT Vmax:   111.00 cm/s LVOT Vmean:  70.200 cm/s LVOT VTI:    0.237 m  AORTA Ao Root diam: 3.30 cm Ao Asc diam:  4.20 cm MITRAL VALVE               TRICUSPID VALVE MV Area (PHT): 3.03 cm    TR Peak grad:   18.0 mmHg MV Decel Time: 250 msec    TR Vmax:        212.00 cm/s MV E velocity: 82.30 cm/s MV A velocity: 67.60 cm/s  SHUNTS MV E/A ratio:  1.22        Systemic VTI:  0.24 m                            Systemic Diam: 2.20 cm Shelda Bruckner MD Electronically signed by Shelda Bruckner MD Signature Date/Time: 02/14/2024/1:07:03 PM    Final    MR BRAIN WO CONTRAST Result Date: 02/13/2024 CLINICAL DATA:  Syncope/presyncope, cerebrovascular cause suspected EXAM: MRI HEAD WITHOUT CONTRAST TECHNIQUE: Multiplanar, multiecho pulse sequences of the brain and surrounding structures were obtained without intravenous contrast. COMPARISON:  Same day CT head. FINDINGS: Brain: No acute infarction, hemorrhage, hydrocephalus, extra-axial collection or mass lesion. Frontal predominant cerebral atrophy. Vascular: Normal flow voids. Skull and upper cervical spine: Normal marrow signal. Sinuses/Orbits: Clear sinuses.  No acute orbital findings. IMPRESSION: 1. No evidence of acute intracranial abnormality. 2. Frontal predominant cerebral atrophy (ICD10-G31.9). Electronically Signed   By: Gilmore GORMAN Molt M.D.   On:  02/13/2024 23:01   CT ABDOMEN PELVIS W CONTRAST Result Date: 02/13/2024 CLINICAL DATA:  Acute, non localized abdominal pain. Found unconscious. EXAM: CT ABDOMEN AND PELVIS WITH CONTRAST TECHNIQUE: Multidetector CT imaging of the abdomen and pelvis was performed using the standard protocol following bolus administration of intravenous contrast. RADIATION DOSE REDUCTION: This exam was performed according to the departmental dose-optimization program which includes automated exposure control, adjustment of the mA and/or kV according to patient size and/or use of iterative reconstruction technique. CONTRAST:  75mL OMNIPAQUE  IOHEXOL  350 MG/ML SOLN COMPARISON:  02/09/2022.  Chest CTA dated 01/18/2020. FINDINGS: Lower chest: Stable partially included small hiatal hernia. Interval right lower lobe patchy densities and minimal bibasilar dependent  atelectasis. Hepatobiliary: Small, faintly calcified gallstones in the gallbladder measuring up to 4 mm in maximum diameter each. No gallbladder wall thickening or pericholecystic fluid. Unremarkable liver. Pancreas: Unremarkable. No pancreatic ductal dilatation or surrounding inflammatory changes. Spleen: Normal in size without focal abnormality. Adrenals/Urinary Tract: Normal-appearing adrenal glands. Small simple appearing left renal cyst. This does not need imaging follow-up. Unremarkable right kidney, ureters and urinary bladder. Stomach/Bowel: Small hiatal hernia. Unremarkable small bowel and appendix. Minimal descending colon diverticulosis. Vascular/Lymphatic: Atheromatous arterial calcifications without aneurysm. No enlarged lymph nodes. Reproductive: Prostate radiation seeds. Other: Small to moderate-sized umbilical hernia containing fat. Musculoskeletal: Left hip prosthesis. Moderate right hip degenerative changes. Mild lumbar and moderate to marked lower thoracic spine degenerative changes. IMPRESSION: 1. No acute abnormality in the abdomen or pelvis. 2. Interval  right lower lobe patchy densities, compatible with pneumonia or aspiration pneumonitis. 3. Cholelithiasis without evidence of cholecystitis. 4. Small to moderate-sized umbilical hernia containing fat. 5. Small hiatal hernia. 6. Minimal descending colon diverticulosis. Electronically Signed   By: Elspeth Bathe M.D.   On: 02/13/2024 13:27   CT Head Wo Contrast Result Date: 02/13/2024 CLINICAL DATA:  Head trauma.  Syncopal episode. EXAM: CT HEAD WITHOUT CONTRAST CT CERVICAL SPINE WITHOUT CONTRAST TECHNIQUE: Multidetector CT imaging of the head and cervical spine was performed following the standard protocol without intravenous contrast. Multiplanar CT image reconstructions of the cervical spine were also generated. RADIATION DOSE REDUCTION: This exam was performed according to the departmental dose-optimization program which includes automated exposure control, adjustment of the mA and/or kV according to patient size and/or use of iterative reconstruction technique. COMPARISON:  01/25/2024 FINDINGS: CT HEAD FINDINGS Brain: No sign of acute stroke, mass, hemorrhage, hydrocephalus or extra-axial collection. Frontal and temporal lobe atrophy, similar to the study of 2 weeks ago. Vascular: There is atherosclerotic calcification of the major vessels at the base of the brain. Skull: Negative Sinuses/Orbits: Clear sinuses. Old medial orbital blowout fractures. No acute orbital finding. Other: None CT CERVICAL SPINE FINDINGS Alignment: No malalignment. Skull base and vertebrae: No fracture or focal lesion. Distant ACDF C3 through C6 with solid union. Soft tissues and spinal canal: No traumatic soft tissue finding. Disc levels: The foramen magnum is widely patent. There is ordinary mild osteoarthritis of the C1-2 articulation but no encroachment upon the neural structures. C2-3: No stenosis.  Anterior calcification incidentally noted. C3 through C6: Previous ACDF with solid union and sufficient patency of the canal and  foramina. C6-7: Degenerative spondylosis with endplate osteophytes. Bilateral foraminal narrowing that could affect either C7 nerve. C7-T1: Mild facet osteoarthritis on the left.  No stenosis. Upper chest: Negative Other: None IMPRESSION: HEAD CT: No acute or traumatic finding. Frontal and temporal lobe atrophy. CERVICAL SPINE CT: No acute or traumatic finding. Previous ACDF C3 through C6 with solid union and sufficient patency of the canal and foramina. Degenerative spondylosis at C6-7 with bilateral foraminal narrowing that could affect either C7 nerve. Electronically Signed   By: Oneil Officer M.D.   On: 02/13/2024 13:12   CT Cervical Spine Wo Contrast Result Date: 02/13/2024 CLINICAL DATA:  Head trauma.  Syncopal episode. EXAM: CT HEAD WITHOUT CONTRAST CT CERVICAL SPINE WITHOUT CONTRAST TECHNIQUE: Multidetector CT imaging of the head and cervical spine was performed following the standard protocol without intravenous contrast. Multiplanar CT image reconstructions of the cervical spine were also generated. RADIATION DOSE REDUCTION: This exam was performed according to the departmental dose-optimization program which includes automated exposure control, adjustment of the mA and/or kV according to  patient size and/or use of iterative reconstruction technique. COMPARISON:  01/25/2024 FINDINGS: CT HEAD FINDINGS Brain: No sign of acute stroke, mass, hemorrhage, hydrocephalus or extra-axial collection. Frontal and temporal lobe atrophy, similar to the study of 2 weeks ago. Vascular: There is atherosclerotic calcification of the major vessels at the base of the brain. Skull: Negative Sinuses/Orbits: Clear sinuses. Old medial orbital blowout fractures. No acute orbital finding. Other: None CT CERVICAL SPINE FINDINGS Alignment: No malalignment. Skull base and vertebrae: No fracture or focal lesion. Distant ACDF C3 through C6 with solid union. Soft tissues and spinal canal: No traumatic soft tissue finding. Disc levels:  The foramen magnum is widely patent. There is ordinary mild osteoarthritis of the C1-2 articulation but no encroachment upon the neural structures. C2-3: No stenosis.  Anterior calcification incidentally noted. C3 through C6: Previous ACDF with solid union and sufficient patency of the canal and foramina. C6-7: Degenerative spondylosis with endplate osteophytes. Bilateral foraminal narrowing that could affect either C7 nerve. C7-T1: Mild facet osteoarthritis on the left.  No stenosis. Upper chest: Negative Other: None IMPRESSION: HEAD CT: No acute or traumatic finding. Frontal and temporal lobe atrophy. CERVICAL SPINE CT: No acute or traumatic finding. Previous ACDF C3 through C6 with solid union and sufficient patency of the canal and foramina. Degenerative spondylosis at C6-7 with bilateral foraminal narrowing that could affect either C7 nerve. Electronically Signed   By: Oneil Officer M.D.   On: 02/13/2024 13:12   CT Head Wo Contrast Result Date: 01/25/2024 CLINICAL DATA:  73 year old male status post unwitnessed syncope, found down. Vomited. EXAM: CT HEAD WITHOUT CONTRAST TECHNIQUE: Contiguous axial images were obtained from the base of the skull through the vertex without intravenous contrast. RADIATION DOSE REDUCTION: This exam was performed according to the departmental dose-optimization program which includes automated exposure control, adjustment of the mA and/or kV according to patient size and/or use of iterative reconstruction technique. COMPARISON:  Brain MRI 06/29/2019.  Head CT 07/03/2020. FINDINGS: Brain: Disproportionate frontal lobe atrophy appears new or substantially progressed from prior exams (series 2, image 16). Ex vacuo enlargement of the frontal horns. And on sagittal and coronal images at least some associated anterior temporal lobe atrophy also (coronal image 49). No superimposed No midline shift, ventriculomegaly, mass effect, evidence of mass lesion, intracranial hemorrhage or evidence  of cortically based acute infarction. Vascular: Calcified atherosclerosis at the skull base. No suspicious intracranial vascular hyperdensity. Skull: Chronic bilateral lamina papyracea fractures. Otherwise stable and intact. Sinuses/Orbits: Visualized paranasal sinuses and mastoids are stable and well aerated. Other: No acute orbit or scalp soft tissue injury identified. Chronic dystrophic scalp soft tissue calcification especially at the vertex. IMPRESSION: 1. No acute intracranial abnormality or acute traumatic injury identified. 2. Disproportionate frontal > temporal lobe atrophy is new or substantially progressed from prior exams, suspicious for neurodegenerative disease including frontotemporal lobar degeneration. Cerebral Atrophy (ICD10-G31.9). Electronically Signed   By: VEAR Hurst M.D.   On: 01/25/2024 12:16   DG Chest 2 View Result Date: 01/25/2024 CLINICAL DATA:  Syncope with bradycardia and vomiting. EXAM: CHEST - 2 VIEW COMPARISON:  01/01/2020 FINDINGS: Lungs are adequately inflated and otherwise clear. Cardiomediastinal silhouette and remainder of the exam is unchanged. IMPRESSION: No active cardiopulmonary disease. Electronically Signed   By: Toribio Agreste M.D.   On: 01/25/2024 12:03    Microbiology: Results for orders placed or performed during the hospital encounter of 02/13/24  Culture, blood (Routine X 2) w Reflex to ID Panel     Status: None (Preliminary  result)   Collection Time: 02/14/24  2:55 AM   Specimen: BLOOD  Result Value Ref Range Status   Specimen Description BLOOD BLOOD LEFT ARM  Final   Special Requests   Final    BOTTLES DRAWN AEROBIC AND ANAEROBIC Blood Culture adequate volume   Culture   Final    NO GROWTH 2 DAYS Performed at [facility], [address] [place], [place] [address]    Report Status PENDING  Incomplete  Culture, blood (Routine X 2) w Reflex to ID Panel     Status: None (Preliminary result)   Collection Time: 02/14/24  2:56 AM   Specimen: BLOOD   Result Value Ref Range Status   Specimen Description BLOOD BLOOD LEFT HAND  Final   Special Requests   Final    BOTTLES DRAWN AEROBIC AND ANAEROBIC Blood Culture adequate volume   Culture   Final    NO GROWTH 2 DAYS Performed at [facility], [address], [place], [place] [address]    Report Status PENDING  Incomplete   Labs: CBC: Recent Labs  Lab 02/13/24 1126 02/14/24 0138  WBC 4.9 15.2*  HGB 11.8* 11.5*  HCT 33.9* 32.0*  MCV 80.5 77.3*  PLT 203 198   Basic Metabolic Panel: Recent Labs  Lab 02/13/24 1134 02/13/24 1823 02/14/24 0138 02/15/24 0700  NA 140  --  139 137  K 3.8  --  4.6 4.0  CL 102  --  103 107  CO2 26  --  24 23  GLUCOSE 118*  --  114* 91  BUN 12  --  16 11  CREATININE 1.33*  --  1.73* 1.42*  CALCIUM  9.4  --  9.0 8.4*  MG  --  1.4* 1.6* 2.0   Liver Function Tests: Recent Labs  Lab 02/13/24 1134  AST 23  ALT 23  ALKPHOS 52  BILITOT 0.7  PROT 6.0*  ALBUMIN 3.4*   CBG: Recent Labs  Lab 02/14/24 0505  GLUCAP 102*    Discharge time spent: greater than 30 minutes.  Author: Yetta Blanch, MD  [facility] 02/15/2024

## 2024-02-17 ENCOUNTER — Encounter: Payer: Self-pay | Admitting: *Deleted

## 2024-02-17 NOTE — Progress Notes (Signed)
 Patient ID: Dennis Huffman, male   DOB: Mar 03, 1951, 73 y.o.   MRN: [id number] Patient enrolled for [employer] to ship a 30 day cardiac event monitor to his address on file. Letter with instructions mailed to patient. Dr. Raford to read.

## 2024-02-19 LAB — CULTURE, BLOOD (ROUTINE X 2)
Culture: NO GROWTH
Culture: NO GROWTH
Special Requests: ADEQUATE
Special Requests: ADEQUATE

## 2024-02-26 DIAGNOSIS — R001 Bradycardia, unspecified: Secondary | ICD-10-CM | POA: Diagnosis not present

## 2024-02-26 DIAGNOSIS — R55 Syncope and collapse: Secondary | ICD-10-CM | POA: Diagnosis not present

## 2024-03-23 ENCOUNTER — Ambulatory Visit (HOSPITAL_BASED_OUTPATIENT_CLINIC_OR_DEPARTMENT_OTHER): Admitting: Family

## 2024-03-23 ENCOUNTER — Encounter (HOSPITAL_BASED_OUTPATIENT_CLINIC_OR_DEPARTMENT_OTHER): Payer: Self-pay | Admitting: Family

## 2024-03-23 VITALS — BP 124/76 | HR 64 | Ht 71.0 in | Wt 207.0 lb

## 2024-03-23 DIAGNOSIS — R001 Bradycardia, unspecified: Secondary | ICD-10-CM | POA: Diagnosis not present

## 2024-03-23 DIAGNOSIS — I10 Essential (primary) hypertension: Secondary | ICD-10-CM | POA: Diagnosis not present

## 2024-03-23 DIAGNOSIS — E782 Mixed hyperlipidemia: Secondary | ICD-10-CM

## 2024-03-23 NOTE — Patient Instructions (Signed)
 Medication Instructions:  Continue your current medications  Remain off: Amlodipine , Losartan , Metoprolol   *If you need a refill on your cardiac medications before your next appointment, please call your pharmacy*  Testing/Procedures: Your echocardiogram in the hospital showed your heart muscle was nice and strong!  Go ahead and mail in the heart monitor.   Follow-Up: At [facility], you and your health needs are our priority.  As part of our continuing mission to provide you with exceptional heart care, our providers are all part of one team.  This team includes your primary Cardiologist (physician) and Advanced Practice Providers or APPs (Physician Assistants and Nurse Practitioners) who all work together to provide you with the care you need, when you need it.  Your next appointment:   1 year(s)  Provider:   Annabella Scarce, MD or Reche Finder, NP    We recommend signing up for the patient portal called MyChart.  Sign up information is provided on this After Visit Summary.  MyChart is used to connect with patients for Virtual Visits (Telemedicine).  Patients are able to view lab/test results, encounter notes, upcoming appointments, etc.  Non-urgent messages can be sent to your provider as well.   To learn more about what you can do with MyChart, go to [email or web address].

## 2024-03-23 NOTE — Progress Notes (Signed)
 Cardiology Office Note   Date:  03/23/2024  ID:  Dennis Huffman, DOB [birth year], MRN [id number] PCP: Ransom Other, MD  [facility] Providers Cardiologist:  Annabella Scarce, MD     History of Present Illness Dennis Huffman is a 73 y.o. 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  Admitted 8/21-8/23/25 after presenting with syncope being found down on the ground in the bathroom by his family.  EMS found heart rate 20s.  Required atropine.  CT head, C-spine, MRI brain negative for acute abnormality intracranially.  Echo LVEF 60 to 65%, no significant valvular abnormality.  On telemetry had bradycardia but no significant heart block.  Metoprolol  discontinued and AV nodal blocking agents avoided.  Outpatient 30-day monitor recommended.  He was inadvertently found to have bilateral likely aspiration pneumonia treated with ABX.  Orthostatic vital signs were negative. Amlodipine  and Losartan  were held on discharge.   Presents today for follow up. Sister assists with history taking. Reports he wore monitor for a couple days - has been wearing intermittently as difficult wearing consistently due to his dementia. Reports no shortness of breath nor dyspnea on exertion. Reports no chest pain, pressure, or tightness. No edema, orthopnea, PND. Reports no palpitations.     ROS: Please see the history of present illness.    All other systems reviewed and are negative.   Studies Reviewed      Cardiac Studies & Procedures   ______________________________________________________________________________________________     ECHOCARDIOGRAM  ECHOCARDIOGRAM COMPLETE 02/14/2024  Narrative ECHOCARDIOGRAM REPORT    Patient Name:   Dennis Huffman Date of Exam: 02/14/2024 Medical Rec #:  [id number]        Height:       71.0 in Accession #:    [id number]       Weight:       202.9 lb Date of Birth:  December 17, 1950         BSA:           2.122 m Patient Age:    73 years         BP:           111/51 mmHg Patient Gender: M                HR:           49 bpm. Exam Location:  Inpatient  Procedure: 2D Echo, Cardiac Doppler, Color Doppler and Strain Analysis (Both Spectral and Color Flow Doppler were utilized during procedure).  Indications:    Syncope R55  History:        Patient has prior history of Echocardiogram examinations, most recent 07/15/2019. Arrythmias:Bradycardia, Signs/Symptoms:Syncope; Risk Factors:Hypertension and Dyslipidemia.  Sonographer:    Koleen Popper RDCS Referring Phys: LEONTINE LOISE SALEN   Sonographer Comments: Global longitudinal strain was attempted. IMPRESSIONS   1. Left ventricular ejection fraction, by estimation, is 60 to 65%. The left ventricle has normal function. The left ventricle has no regional wall motion abnormalities. There is mild left ventricular hypertrophy. Left ventricular diastolic parameters were normal. The average left ventricular global longitudinal strain is -15.8 %. The global longitudinal strain is normal. 2. Right ventricular systolic function is normal. The right ventricular size is normal. There is normal pulmonary artery systolic pressure. 3. The mitral valve is grossly normal. Trivial mitral valve regurgitation. No evidence of mitral stenosis. 4. The aortic valve is tricuspid. There is mild calcification of the aortic valve. Aortic valve regurgitation is not  visualized. Aortic valve sclerosis is present, with no evidence of aortic valve stenosis. 5. Aortic dilatation noted. There is mild dilatation of the ascending aorta, measuring 42 mm. 6. The inferior vena cava is normal in size with greater than 50% respiratory variability, suggesting right atrial pressure of 3 mmHg.  Comparison(s): No significant change from prior study.  Conclusion(s)/Recommendation(s): Otherwise normal echocardiogram, with minor abnormalities described in the report.  FINDINGS Left  Ventricle: Left ventricular ejection fraction, by estimation, is 60 to 65%. The left ventricle has normal function. The left ventricle has no regional wall motion abnormalities. The average left ventricular global longitudinal strain is -15.8 %. Strain was performed and the global longitudinal strain is normal. The left ventricular internal cavity size was normal in size. There is mild left ventricular hypertrophy. Left ventricular diastolic parameters were normal.  Right Ventricle: The right ventricular size is normal. Right vetricular wall thickness was not well visualized. Right ventricular systolic function is normal. There is normal pulmonary artery systolic pressure. The tricuspid regurgitant velocity is 2.12 m/s, and with an assumed right atrial pressure of 3 mmHg, the estimated right ventricular systolic pressure is 21.0 mmHg.  Left Atrium: Left atrial size was normal in size.  Right Atrium: Right atrial size was normal in size.  Pericardium: There is no evidence of pericardial effusion. Presence of epicardial fat layer.  Mitral Valve: The mitral valve is grossly normal. Trivial mitral valve regurgitation. No evidence of mitral valve stenosis.  Tricuspid Valve: The tricuspid valve is grossly normal. Tricuspid valve regurgitation is trivial. No evidence of tricuspid stenosis.  Aortic Valve: The aortic valve is tricuspid. There is mild calcification of the aortic valve. Aortic valve regurgitation is not visualized. Aortic valve sclerosis is present, with no evidence of aortic valve stenosis.  Pulmonic Valve: The pulmonic valve was not well visualized. Pulmonic valve regurgitation is not visualized. No evidence of pulmonic stenosis.  Aorta: Aortic dilatation noted. There is mild dilatation of the ascending aorta, measuring 42 mm.  Venous: The inferior vena cava is normal in size with greater than 50% respiratory variability, suggesting right atrial pressure of 3 mmHg.  IAS/Shunts: The  atrial septum is grossly normal.   LEFT VENTRICLE PLAX 2D LVIDd:         5.20 cm   Diastology LVIDs:         3.70 cm   LV e' medial:    11.30 cm/s LV PW:         1.10 cm   LV E/e' medial:  7.3 LV IVS:        1.40 cm   LV e' lateral:   13.80 cm/s LVOT diam:     2.20 cm   LV E/e' lateral: 6.0 LV SV:         90 LV SV Index:   42        2D Longitudinal Strain LVOT Area:     3.80 cm  2D Strain GLS (A4C):   -15.8 % 2D Strain GLS (A3C):   -12.9 % 2D Strain GLS (A2C):   -18.7 % 2D Strain GLS Avg:     -15.8 %  RIGHT VENTRICLE             IVC RV Basal diam:  3.80 cm     IVC diam: 1.80 cm RV S prime:     14.90 cm/s TAPSE (M-mode): 2.2 cm  LEFT ATRIUM             Index  RIGHT ATRIUM           Index LA diam:        3.40 cm 1.60 cm/m   RA Area:     17.10 cm LA Vol (A2C):   36.0 ml 16.97 ml/m  RA Volume:   41.60 ml  19.61 ml/m LA Vol (A4C):   23.5 ml 11.08 ml/m LA Biplane Vol: 29.7 ml 14.00 ml/m AORTIC VALVE LVOT Vmax:   111.00 cm/s LVOT Vmean:  70.200 cm/s LVOT VTI:    0.237 m  AORTA Ao Root diam: 3.30 cm Ao Asc diam:  4.20 cm  MITRAL VALVE               TRICUSPID VALVE MV Area (PHT): 3.03 cm    TR Peak grad:   18.0 mmHg MV Decel Time: 250 msec    TR Vmax:        212.00 cm/s MV E velocity: 82.30 cm/s MV A velocity: 67.60 cm/s  SHUNTS MV E/A ratio:  1.22        Systemic VTI:  0.24 m Systemic Diam: 2.20 cm  Shelda Bruckner MD Electronically signed by Shelda Bruckner MD Signature Date/Time: 02/14/2024/1:07:03 PM    Final    MONITORS  CARDIAC EVENT MONITOR 12/30/2019       ______________________________________________________________________________________________      Risk Assessment/Calculations           Physical Exam VS:  BP 124/76   Pulse 64   Ht 5' 11 (1.803 m)   Wt 207 lb (93.9 kg)   BMI 28.87 kg/m        Wt Readings from Last 3 Encounters:  03/23/24 207 lb (93.9 kg)  02/15/24 203 lb 8 oz (92.3 kg)  09/10/23 215 lb 8 oz  (97.8 kg)    GEN: Well nourished, well developed in no acute distress NECK: No JVD; No carotid bruits CARDIAC: RRR, no murmurs, rubs, gallops RESPIRATORY:  Clear to auscultation without rales, wheezing or rhonchi  ABDOMEN: Soft, non-tender, non-distended EXTREMITIES:  No edema; No deformity   ASSESSMENT AND PLAN  Hx of syncope / Sinus bradycardia - resolved since discontinuation of metoprolol .  Avoid AV nodal blocking agent.  Echo 01/2024 normal LVEF, no significant valvular abnormalities.  He has had no recurrent lightheadedness, syncope since hospital discharge. No indication for further workup. Wearing heart monitor difficult due to his dementia-family will mail back for review.   HTN - BP well controlled. Not requiring antihypertensive agent.-BP remains well-controlled off amlodipine  and losartan  will not resume. Discussed to monitor BP at home at least 2 hours after medications and sitting for 5-10 minutes.   HLD - continue atorvastatin  20mg  daily.  Management per PCP.       Dispo: follow up in 1 year with Dr. Raford or APP  Signed, Reche GORMAN Finder, NP

## 2024-03-31 ENCOUNTER — Ambulatory Visit: Attending: Cardiovascular Disease

## 2024-03-31 DIAGNOSIS — R55 Syncope and collapse: Secondary | ICD-10-CM

## 2024-03-31 DIAGNOSIS — R001 Bradycardia, unspecified: Secondary | ICD-10-CM

## 2024-05-06 DIAGNOSIS — R001 Bradycardia, unspecified: Secondary | ICD-10-CM | POA: Diagnosis not present

## 2024-05-06 DIAGNOSIS — R55 Syncope and collapse: Secondary | ICD-10-CM | POA: Diagnosis not present

## 2024-05-23 ENCOUNTER — Ambulatory Visit: Payer: Self-pay | Admitting: Cardiovascular Disease

## 2024-06-16 ENCOUNTER — Inpatient Hospital Stay (HOSPITAL_COMMUNITY)
Admission: EM | Admit: 2024-06-16 | Discharge: 2024-06-18 | DRG: 312 | Disposition: A | Discharge status: Home / Self Care | Attending: Hospitalist | Admitting: Hospitalist

## 2024-06-16 ENCOUNTER — Other Ambulatory Visit: Payer: Self-pay

## 2024-06-16 ENCOUNTER — Emergency Department (HOSPITAL_COMMUNITY)

## 2024-06-16 DIAGNOSIS — E7800 Pure hypercholesterolemia, unspecified: Secondary | ICD-10-CM | POA: Diagnosis present

## 2024-06-16 DIAGNOSIS — Z981 Arthrodesis status: Secondary | ICD-10-CM

## 2024-06-16 DIAGNOSIS — R55 Syncope and collapse: Secondary | ICD-10-CM | POA: Diagnosis not present

## 2024-06-16 DIAGNOSIS — Z96642 Presence of left artificial hip joint: Secondary | ICD-10-CM | POA: Diagnosis present

## 2024-06-16 DIAGNOSIS — Z79899 Other long term (current) drug therapy: Secondary | ICD-10-CM

## 2024-06-16 DIAGNOSIS — R1110 Vomiting, unspecified: Secondary | ICD-10-CM | POA: Diagnosis present

## 2024-06-16 DIAGNOSIS — Z8546 Personal history of malignant neoplasm of prostate: Secondary | ICD-10-CM

## 2024-06-16 DIAGNOSIS — Z87891 Personal history of nicotine dependence: Secondary | ICD-10-CM

## 2024-06-16 DIAGNOSIS — I129 Hypertensive chronic kidney disease with stage 1 through stage 4 chronic kidney disease, or unspecified chronic kidney disease: Secondary | ICD-10-CM | POA: Diagnosis present

## 2024-06-16 DIAGNOSIS — F0390 Unspecified dementia without behavioral disturbance: Secondary | ICD-10-CM | POA: Diagnosis present

## 2024-06-16 DIAGNOSIS — C61 Malignant neoplasm of prostate: Secondary | ICD-10-CM | POA: Diagnosis present

## 2024-06-16 DIAGNOSIS — E785 Hyperlipidemia, unspecified: Secondary | ICD-10-CM | POA: Diagnosis present

## 2024-06-16 DIAGNOSIS — R001 Bradycardia, unspecified: Secondary | ICD-10-CM | POA: Diagnosis present

## 2024-06-16 DIAGNOSIS — Z923 Personal history of irradiation: Secondary | ICD-10-CM

## 2024-06-16 DIAGNOSIS — Z823 Family history of stroke: Secondary | ICD-10-CM

## 2024-06-16 DIAGNOSIS — N182 Chronic kidney disease, stage 2 (mild): Secondary | ICD-10-CM | POA: Diagnosis present

## 2024-06-16 DIAGNOSIS — I10 Essential (primary) hypertension: Secondary | ICD-10-CM | POA: Diagnosis present

## 2024-06-16 LAB — CBC WITH DIFFERENTIAL/PLATELET
Abs Immature Granulocytes: 0.03 K/uL (ref 0.00–0.07)
Basophils Absolute: 0.1 K/uL (ref 0.0–0.1)
Basophils Relative: 1 %
Eosinophils Absolute: 0.1 K/uL (ref 0.0–0.5)
Eosinophils Relative: 2 %
HCT: 35.6 % — ABNORMAL LOW (ref 39.0–52.0)
Hemoglobin: 12.7 g/dL — ABNORMAL LOW (ref 13.0–17.0)
Immature Granulocytes: 0 %
Lymphocytes Relative: 15 %
Lymphs Abs: 1.1 K/uL (ref 0.7–4.0)
MCH: 28.2 pg (ref 26.0–34.0)
MCHC: 35.7 g/dL (ref 30.0–36.0)
MCV: 79.1 fL — ABNORMAL LOW (ref 80.0–100.0)
Monocytes Absolute: 0.6 K/uL (ref 0.1–1.0)
Monocytes Relative: 7 %
Neutro Abs: 5.7 K/uL (ref 1.7–7.7)
Neutrophils Relative %: 75 %
Platelets: 182 K/uL (ref 150–400)
RBC: 4.5 MIL/uL (ref 4.22–5.81)
RDW: 15 % (ref 11.5–15.5)
WBC: 7.5 K/uL (ref 4.0–10.5)
nRBC: 0 % (ref 0.0–0.2)

## 2024-06-16 LAB — COMPREHENSIVE METABOLIC PANEL WITH GFR
ALT: 28 U/L (ref 0–44)
AST: 25 U/L (ref 15–41)
Albumin: 4 g/dL (ref 3.5–5.0)
Alkaline Phosphatase: 63 U/L (ref 38–126)
Anion gap: 8 (ref 5–15)
BUN: 10 mg/dL (ref 8–23)
CO2: 29 mmol/L (ref 22–32)
Calcium: 8.8 mg/dL — ABNORMAL LOW (ref 8.9–10.3)
Chloride: 105 mmol/L (ref 98–111)
Creatinine, Ser: 1.32 mg/dL — ABNORMAL HIGH (ref 0.61–1.24)
GFR, Estimated: 56 mL/min — ABNORMAL LOW
Glucose, Bld: 116 mg/dL — ABNORMAL HIGH (ref 70–99)
Potassium: 3.7 mmol/L (ref 3.5–5.1)
Sodium: 142 mmol/L (ref 135–145)
Total Bilirubin: 0.9 mg/dL (ref 0.0–1.2)
Total Protein: 6.3 g/dL — ABNORMAL LOW (ref 6.5–8.1)

## 2024-06-16 LAB — TROPONIN T, HIGH SENSITIVITY: Troponin T High Sensitivity: <15 ng/L (ref 0–19)

## 2024-06-16 LAB — LIPASE, BLOOD: Lipase: 21 U/L (ref 11–51)

## 2024-06-16 LAB — MAGNESIUM: Magnesium: 0.9 mg/dL — CL (ref 1.7–2.4)

## 2024-06-16 LAB — PRO BRAIN NATRIURETIC PEPTIDE: Pro Brain Natriuretic Peptide: 63.1 pg/mL

## 2024-06-16 MED ORDER — LORAZEPAM 1 MG PO TABS
1.0000 mg | ORAL_TABLET | Freq: Once | ORAL | Status: AC
  Administered 2024-06-16: 1 mg via ORAL
  Filled 2024-06-16: qty 1

## 2024-06-16 MED ORDER — SODIUM CHLORIDE 0.9 % IV BOLUS
500.0000 mL | Freq: Once | INTRAVENOUS | Status: AC
  Administered 2024-06-16: 500 mL via INTRAVENOUS

## 2024-06-16 MED ORDER — MAGNESIUM SULFATE 2 GM/50ML IV SOLN
2.0000 g | Freq: Once | INTRAVENOUS | Status: AC
  Administered 2024-06-16: 2 g via INTRAVENOUS
  Filled 2024-06-16: qty 50

## 2024-06-16 MED ORDER — DONEPEZIL HCL 10 MG PO TABS
10.0000 mg | ORAL_TABLET | Freq: Every day | ORAL | Status: DC
  Administered 2024-06-16 – 2024-06-17 (×2): 10 mg via ORAL
  Filled 2024-06-16 (×2): qty 1

## 2024-06-16 NOTE — ED Provider Notes (Signed)
 "  Emergency Department Provider Note   I have reviewed the triage vital signs and the nursing notes.   HISTORY  Chief Complaint Syncope   HPI Dennis Huffman is a 73 y.o. male with past history of hypertension, hyperlipidemia, dementia, syncope with bradycardia presents to the emergency department with syncope episode.  Patient arrives from home.  He does not recall the event or how he felt leading up to this.  He is not having any headache.  He did have 2 episodes of vomiting and a bowel movement with EMS.  Denies abdominal or chest pain currently. Level 5 caveat: Dementia   Past Medical History:  Diagnosis Date   Arthritis    cervical spondylotic, radiculopathy   Bradycardia    a. HR previously reported as 40 at PCP's office; event monitor 2017 showed nocturnal bradycardia but no daytime findings.   CKD (chronic kidney disease), stage II    Hypercholesteremia    Hypertension    Premature atrial contractions    Prostate cancer (HCC)    PVC's (premature ventricular contractions)     Review of Systems  Level 5 caveat: Dementia   ____________________________________________   PHYSICAL EXAM:  VITAL SIGNS: ED Triage Vitals  Encounter Vitals Group     BP 06/16/24 2000 105/66     Pulse Rate 06/16/24 2000 (!) 49     Resp 06/16/24 2000 18     Temp 06/16/24 2000 98.2 F (36.8 C)     Temp Source 06/16/24 2000 Oral     SpO2 06/16/24 2000 97 %     Weight 06/16/24 1954 207 lb 0.2 oz (93.9 kg)     Height 06/16/24 1954 5' 11 (1.803 m)   Constitutional: Alert but confused. Does not recall the events surrounding his syncope.  Eyes: Conjunctivae are normal.  Head: Atraumatic. Nose: No congestion/rhinnorhea. Mouth/Throat: Mucous membranes are moist.   Neck: No stridor.  Cardiovascular: Normal rate, regular rhythm. Good peripheral circulation. Grossly normal heart sounds.   Respiratory: Normal respiratory effort.  No retractions. Lungs CTAB. Gastrointestinal: Soft and  nontender. No distention.  Musculoskeletal: No lower extremity tenderness nor edema. No gross deformities of extremities. Neurologic:  Normal speech and language.  Skin:  Skin is warm, dry and intact. No rash noted.  ____________________________________________   LABS (all labs ordered are listed, but only abnormal results are displayed)  Labs Reviewed  COMPREHENSIVE METABOLIC PANEL WITH GFR - Abnormal; Notable for the following components:      Result Value   Glucose, Bld 116 (*)    Creatinine, Ser 1.32 (*)    Calcium  8.8 (*)    Total Protein 6.3 (*)    GFR, Estimated 56 (*)    All other components within normal limits  MAGNESIUM  - Abnormal; Notable for the following components:   Magnesium  0.9 (*)    All other components within normal limits  CBC WITH DIFFERENTIAL/PLATELET - Abnormal; Notable for the following components:   Hemoglobin 12.7 (*)    HCT 35.6 (*)    MCV 79.1 (*)    All other components within normal limits  URINALYSIS, W/ REFLEX TO CULTURE (INFECTION SUSPECTED) - Abnormal; Notable for the following components:   Bacteria, UA RARE (*)    All other components within normal limits  COMPREHENSIVE METABOLIC PANEL WITH GFR - Abnormal; Notable for the following components:   Glucose, Bld 110 (*)    Total Protein 6.3 (*)    Total Bilirubin 1.9 (*)    All other components within  normal limits  BASIC METABOLIC PANEL WITH GFR - Abnormal; Notable for the following components:   Creatinine, Ser 1.32 (*)    Calcium  8.8 (*)    GFR, Estimated 56 (*)    All other components within normal limits  MAGNESIUM  - Abnormal; Notable for the following components:   Magnesium  0.8 (*)    All other components within normal limits  GLUCOSE, CAPILLARY - Abnormal; Notable for the following components:   Glucose-Capillary 100 (*)    All other components within normal limits  PRO BRAIN NATRIURETIC PEPTIDE  LIPASE, BLOOD  MAGNESIUM   TROPONIN T, HIGH SENSITIVITY  TROPONIN T, HIGH  SENSITIVITY   ____________________________________________  EKG   EKG Interpretation Date/Time:  Tuesday June 16 2024 19:59:46 EST Ventricular Rate:  49 PR Interval:  197 QRS Duration:  125 QT Interval:  604 QTC Calculation: 546 R Axis:   -1  Text Interpretation: Sinus bradycardia Nonspecific intraventricular conduction delay Borderline T abnormalities, inferior leads Confirmed by Darra Chew [phone]) on 06/16/2024 8:02:24 PM       ____________________________________________   PROCEDURES  Procedure(s) performed:   Procedures  CRITICAL CARE Performed by: Chew KANDICE Darra Total critical care time: 35 minutes Critical care time was exclusive of separately billable procedures and treating other patients. Critical care was necessary to treat or prevent imminent or life-threatening deterioration. Critical care was time spent personally by me on the following activities: development of treatment plan with patient and/or surrogate as well as nursing, discussions with consultants, evaluation of patient's response to treatment, examination of patient, obtaining history from patient or surrogate, ordering and performing treatments and interventions, ordering and review of laboratory studies, ordering and review of radiographic studies, pulse oximetry and re-evaluation of patient's condition.  Chew Darra, MD Emergency Medicine  ____________________________________________   INITIAL IMPRESSION / ASSESSMENT AND PLAN / ED COURSE  Pertinent labs & imaging results that were available during my care of the patient were reviewed by me and considered in my medical decision making (see chart for details).   This patient is Presenting for Evaluation of syncope, which does require a range of treatment options, and is a complaint that involves a high risk of morbidity and mortality.  The Differential Diagnoses include cardiogenic syncope, vasovagal, dehydration, electrolyte disturbance,  etc.  Critical Interventions-    Medications  lactated ringers  infusion (0 mLs Intravenous Stopped 06/17/24 1619)  sodium chloride  0.9 % bolus 500 mL (0 mLs Intravenous Stopped 06/16/24 2219)  magnesium  sulfate IVPB 2 g 50 mL (0 g Intravenous Stopped 06/16/24 2240)  LORazepam  (ATIVAN ) tablet 1 mg (1 mg Oral Given 06/16/24 2319)  magnesium  sulfate IVPB 4 g 100 mL (4 g Intravenous New Bag/Given 06/17/24 1757)  magnesium  sulfate IVPB 1 g 100 mL (0 g Intravenous Stopped 06/18/24 1148)  potassium chloride  SA (KLOR-CON  M) CR tablet 20 mEq (20 mEq Oral Given 06/18/24 1041)    Reassessment after intervention: symptoms unchanged.    I did obtain Additional Historical Information from family at bedside, as the patient suffers from dementia.  I decided to review pertinent External Data, and in summary fairly extensive syncope evaluation in August of this year.   Clinical Laboratory Tests Ordered, included Mg low at 0.9. No AKI. Troponin negative. BNP negative.   Radiologic Tests Ordered, included CT head and CXR. I independently interpreted the images and agree with radiology interpretation.   Cardiac Monitor Tracing which shows NSR.    Social Determinants of Health Risk patient is a non-smoker.   Consult complete  with [facility]. Plan for admit.   Medical Decision Making: Summary:  Patient presents with syncope event. Bradycardia here. He does not recall the events today. Will obtain screening labs and monitor on telemetry.   Reevaluation with update and discussion with patient and family. Mg is low. Will replace and admit for observation. They are in agreement.   Patient's presentation is most consistent with acute presentation with potential threat to life or bodily function.   Disposition: admit  ____________________________________________  FINAL CLINICAL IMPRESSION(S) / ED DIAGNOSES  Final diagnoses:  Syncope, unspecified syncope type  Hypomagnesemia     NEW OUTPATIENT  MEDICATIONS STARTED DURING THIS VISIT:  Discharge Medication List as of 06/18/2024  3:16 PM     START taking these medications   Details  magnesium  oxide (MAG-OX) 400 MG tablet Take 1 tablet (400 mg total) by mouth daily., Starting Thu 06/18/2024, Normal        Note:  This document was prepared using Dragon voice recognition software and may include unintentional dictation errors.  Fonda Law, MD, [facility] Emergency Medicine    Durell Lofaso, Fonda MATSU, MD 06/21/24 [phone]  "

## 2024-06-16 NOTE — H&P (Signed)
 " History and Physical    Patient: Dennis Huffman FMW:[id number] DOB: 16-Dec-1950 DOA: 06/16/2024 DOS: the patient was seen and examined on 06/16/2024 PCP: Ransom Other, MD  Patient coming from: Home  Chief Complaint:  Chief Complaint  Patient presents with   Syncope   HPI: Dennis Huffman is a 73 y.o. male with medical history significant of prostate cancer, osteoarthritis, history of bradycardia, chronic kidney disease stage II, hyperlipidemia, essential hypertension, history of PACs, history of prostate cancer, dementia, who was brought in from home after sustaining a syncopal episode.  Patient was not observed but he was hard to have following by family they went and found him on the floor.  He was out for a little while.  Due to dementia patient does not remember what happened unable to give history.  Family reported the incident.  He is not on any blood thinner.  Did not hit his head.  In the ER workup performed showed patient still has some bradycardia.  Also noted to have hypomagnesemia with magnesium  of 0.9.  EKG showed mainly bradycardia.  Patient has been admitted with syncopal episode and hypomagnesemia.  Review of Systems: As mentioned in the history of present illness. All other systems reviewed and are negative. Past Medical History:  Diagnosis Date   Arthritis    cervical spondylotic, radiculopathy   Bradycardia    a. HR previously reported as 40 at PCP's office; event monitor 2017 showed nocturnal bradycardia but no daytime findings.   CKD (chronic kidney disease), stage II    Hypercholesteremia    Hypertension    Premature atrial contractions    Prostate cancer (HCC)    PVC's (premature ventricular contractions)    Past Surgical History:  Procedure Laterality Date   ANTERIOR CERVICAL DECOMP/DISCECTOMY FUSION N/A 05/12/2015   Procedure: ANTERIOR CERVICAL DECOMPRESSION/DISCECTOMY FUSION C3 - C6 3 LEVELS;  Surgeon: Donaciano Sprang, MD;  Location: [facility] OR;  Service:  Orthopedics;  Laterality: N/A;   colonscopy     COLONSCOPY      INSERTION PROSTATE RADIATION SEED  UNSURE OF DATE   DONE BY DR CEIL    PROSTATE BIOPSY     ROTATOR CUFF REPAIR Right 03-13-12; 09-27-15   TOTAL HIP ARTHROPLASTY Left 11/28/2018   Procedure: TOTAL HIP ARTHROPLASTY;  Surgeon: Shari Sieving, MD;  Location: [facility];  Service: Orthopedics;  Laterality: Left;   Social History:  reports that he quit smoking about 53 years ago. His smoking use included cigarettes. He has never used smokeless tobacco. He reports current alcohol use. He reports that he does not use drugs.  Allergies[1]  Family History  Problem Relation Age of Onset   Healthy Mother    Healthy Father    Stroke Sister    Cancer Sister        breast    Prior to Admission medications  Medication Sig Start Date End Date Taking? Authorizing Provider  acetaminophen  (TYLENOL ) 650 MG CR tablet Take 650-1,300 mg by mouth every 8 (eight) hours as needed for pain.    [provider]  atorvastatin  (LIPITOR) 20 MG tablet Take 20 mg by mouth daily.    [provider]  donepezil  (ARICEPT ) 10 MG tablet Take 1 tablet (10 mg total) by mouth at bedtime. 09/25/22   Sater, Charlie LABOR, MD  memantine  (NAMENDA ) 10 MG tablet Take 1 tablet (10 mg total) by mouth 2 (two) times daily. Begin this prescription after the 5 mg is complete 09/10/23   Sater, Charlie LABOR, MD  pantoprazole  (PROTONIX ) 40 MG tablet Take 40 mg by mouth daily.    [provider]  sertraline  (ZOLOFT ) 50 MG tablet Take 50 mg by mouth daily. 01/04/23   [provider]    Physical Exam: Vitals:   06/16/24 1954 06/16/24 2000 06/16/24 2129  BP:  105/66 (!) 149/74  Pulse:  (!) 49 (!) 55  Resp:  18 17  Temp:  98.2 F (36.8 C) (!) 97.5 F (36.4 C)  TempSrc:  Oral   SpO2:  97% 100%  Weight: 93.9 kg    Height: 5' 11 (1.803 m)     Constitutional: Confused, chronically ill looking NAD, calm, comfortable Eyes: PERRL, lids and conjunctivae  normal ENMT: Mucous membranes are moist. Posterior pharynx clear of any exudate or lesions.Normal dentition.  Neck: normal, supple, no masses, no thyromegaly Respiratory: clear to auscultation bilaterally, no wheezing, no crackles. Normal respiratory effort. No accessory muscle use.  Cardiovascular: Sinus bradycardia no murmurs / rubs / gallops. No extremity edema. 2+ pedal pulses. No carotid bruits.  Abdomen: no tenderness, no masses palpated. No hepatosplenomegaly. Bowel sounds positive.  Musculoskeletal: Good range of motion, no joint swelling or tenderness, Skin: no rashes, lesions, ulcers. No induration Neurologic: CN 2-12 grossly intact. Sensation intact, DTR normal. Strength 5/5 in all 4.  Psychiatric: Confused, responsive to voice and communicating. Normal mood  Data Reviewed:  Temperature 98.2, blood pressure 149/74, pulse 49, hemoglobin 12.7 creatinine 1.32 with calcium  8.8.  Magnesium  0.9, glucose 116.    Assessment and Plan:  #1 syncope: Patient had syncopal episode in August and had full workup done.  No significant findings then.  Patient may be having vasovagal syncope.  Magnesium  is very low and patient could have had some arrhythmias as a result leading to the syncopal episode.  Will admit the patient to a telemetry floor for observation.  Replete magnesium .  Repeat PT and OT.  Monitor on telemetry.  Patient may benefit from Holter monitoring after discharge this time around.  Hydrate.  #2 hypomagnesemia: Replete magnesium  and follow closely.  #3 dementia: No agitation.  Patient unable to remember what happened.  Resume home regimen of Namenda  and Aricept   #4 essential hypertension: Will confirm and resume home regimen.  #5 history of prostate cancer: Appears to be in remission.  Continue to monitor  #6 hyperlipidemia: Continue with statin  #7 sinus bradycardia: Chronic but persistent.  This may have gotten worse leading to the syncope.  Was evaluated in August but not  thought to be the cause.  Continue telemonitoring    Advance Care Planning:   Code Status: Full Code   Consults: Possible cardiology consult in the morning  Family Communication: Wife over the phone  Severity of Illness: The appropriate patient status for this patient is OBSERVATION. Observation status is judged to be reasonable and necessary in order to provide the required intensity of service to ensure the patient's safety. The patient's presenting symptoms, physical exam findings, and initial radiographic and laboratory data in the context of their medical condition is felt to place them at decreased risk for further clinical deterioration. Furthermore, it is anticipated that the patient will be medically stable for discharge from the hospital within 2 midnights of admission.   AuthorBETHA SIM KNOLL, MD 06/16/2024 10:46 PM  For on call review [email or web address].      [1] No Known Allergies  "

## 2024-06-16 NOTE — ED Triage Notes (Signed)
 BIB [facility] from home after syncopal episode. Pt does not remember passing out. Pt denies headache and states that he is not on blood thinners.

## 2024-06-17 ENCOUNTER — Other Ambulatory Visit: Payer: Self-pay | Admitting: Physician Assistant

## 2024-06-17 ENCOUNTER — Encounter (HOSPITAL_COMMUNITY): Payer: Self-pay | Admitting: Internal Medicine

## 2024-06-17 DIAGNOSIS — Z823 Family history of stroke: Secondary | ICD-10-CM | POA: Diagnosis not present

## 2024-06-17 DIAGNOSIS — Z981 Arthrodesis status: Secondary | ICD-10-CM | POA: Diagnosis not present

## 2024-06-17 DIAGNOSIS — Z96642 Presence of left artificial hip joint: Secondary | ICD-10-CM | POA: Diagnosis present

## 2024-06-17 DIAGNOSIS — Z79899 Other long term (current) drug therapy: Secondary | ICD-10-CM | POA: Diagnosis not present

## 2024-06-17 DIAGNOSIS — N182 Chronic kidney disease, stage 2 (mild): Secondary | ICD-10-CM | POA: Diagnosis present

## 2024-06-17 DIAGNOSIS — R001 Bradycardia, unspecified: Secondary | ICD-10-CM | POA: Diagnosis present

## 2024-06-17 DIAGNOSIS — I129 Hypertensive chronic kidney disease with stage 1 through stage 4 chronic kidney disease, or unspecified chronic kidney disease: Secondary | ICD-10-CM | POA: Diagnosis present

## 2024-06-17 DIAGNOSIS — E7800 Pure hypercholesterolemia, unspecified: Secondary | ICD-10-CM | POA: Diagnosis present

## 2024-06-17 DIAGNOSIS — R55 Syncope and collapse: Secondary | ICD-10-CM | POA: Diagnosis present

## 2024-06-17 DIAGNOSIS — Z923 Personal history of irradiation: Secondary | ICD-10-CM | POA: Diagnosis not present

## 2024-06-17 DIAGNOSIS — Z87891 Personal history of nicotine dependence: Secondary | ICD-10-CM | POA: Diagnosis not present

## 2024-06-17 DIAGNOSIS — F0390 Unspecified dementia without behavioral disturbance: Secondary | ICD-10-CM | POA: Diagnosis present

## 2024-06-17 DIAGNOSIS — R1110 Vomiting, unspecified: Secondary | ICD-10-CM | POA: Diagnosis present

## 2024-06-17 DIAGNOSIS — Z8546 Personal history of malignant neoplasm of prostate: Secondary | ICD-10-CM | POA: Diagnosis not present

## 2024-06-17 LAB — URINALYSIS, W/ REFLEX TO CULTURE (INFECTION SUSPECTED)
Bilirubin Urine: NEGATIVE
Glucose, UA: NEGATIVE mg/dL
Hgb urine dipstick: NEGATIVE
Ketones, ur: NEGATIVE mg/dL
Leukocytes,Ua: NEGATIVE
Nitrite: NEGATIVE
Protein, ur: NEGATIVE mg/dL
Specific Gravity, Urine: 1.014 (ref 1.005–1.030)
pH: 7 (ref 5.0–8.0)

## 2024-06-17 LAB — BASIC METABOLIC PANEL WITH GFR
Anion gap: 12 (ref 5–15)
BUN: 13 mg/dL (ref 8–23)
CO2: 24 mmol/L (ref 22–32)
Calcium: 8.8 mg/dL — ABNORMAL LOW (ref 8.9–10.3)
Chloride: 105 mmol/L (ref 98–111)
Creatinine, Ser: 1.32 mg/dL — ABNORMAL HIGH (ref 0.61–1.24)
GFR, Estimated: 56 mL/min — ABNORMAL LOW
Glucose, Bld: 97 mg/dL (ref 70–99)
Potassium: 4 mmol/L (ref 3.5–5.1)
Sodium: 140 mmol/L (ref 135–145)

## 2024-06-17 LAB — MAGNESIUM: Magnesium: 0.8 mg/dL — CL (ref 1.7–2.4)

## 2024-06-17 LAB — TROPONIN T, HIGH SENSITIVITY: Troponin T High Sensitivity: <15 ng/L (ref 0–19)

## 2024-06-17 MED ORDER — LACTATED RINGERS IV SOLN: INTRAVENOUS | Status: AC

## 2024-06-17 MED ORDER — SERTRALINE HCL 25 MG PO TABS
50.0000 mg | ORAL_TABLET | Freq: Every day | ORAL | Status: DC
  Administered 2024-06-17 – 2024-06-18 (×2): 50 mg via ORAL
  Filled 2024-06-17 (×2): qty 2

## 2024-06-17 MED ORDER — MAGNESIUM SULFATE 4 GM/100ML IV SOLN
4.0000 g | Freq: Once | INTRAVENOUS | Status: AC
  Administered 2024-06-17: 4 g via INTRAVENOUS
  Filled 2024-06-17: qty 100

## 2024-06-17 MED ORDER — ONDANSETRON HCL 4 MG/2ML IJ SOLN: 4.0000 mg | Freq: Four times a day (QID) | INTRAMUSCULAR | Status: DC | PRN

## 2024-06-17 MED ORDER — LACTATED RINGERS IV SOLN: INTRAVENOUS | Status: DC

## 2024-06-17 MED ORDER — ATORVASTATIN CALCIUM 10 MG PO TABS
20.0000 mg | ORAL_TABLET | Freq: Every day | ORAL | Status: DC
  Administered 2024-06-17 – 2024-06-18 (×2): 20 mg via ORAL
  Filled 2024-06-17 (×2): qty 2

## 2024-06-17 MED ORDER — ENOXAPARIN SODIUM 40 MG/0.4ML IJ SOSY
40.0000 mg | PREFILLED_SYRINGE | INTRAMUSCULAR | Status: DC
  Administered 2024-06-17: 40 mg via SUBCUTANEOUS
  Filled 2024-06-17: qty 0.4

## 2024-06-17 MED ORDER — ONDANSETRON HCL 4 MG PO TABS: 4.0000 mg | ORAL_TABLET | Freq: Four times a day (QID) | ORAL | Status: DC | PRN

## 2024-06-17 MED ORDER — PANTOPRAZOLE SODIUM 40 MG PO TBEC
40.0000 mg | DELAYED_RELEASE_TABLET | Freq: Every day | ORAL | Status: DC
  Administered 2024-06-17 – 2024-06-18 (×2): 40 mg via ORAL
  Filled 2024-06-17 (×2): qty 1

## 2024-06-17 MED ORDER — INFLUENZA VAC SPLIT HIGH-DOSE 0.5 ML IM SUSY
0.5000 mL | PREFILLED_SYRINGE | INTRAMUSCULAR | Status: DC
  Filled 2024-06-17: qty 0.5

## 2024-06-17 MED ORDER — MEMANTINE HCL 10 MG PO TABS
10.0000 mg | ORAL_TABLET | Freq: Two times a day (BID) | ORAL | Status: DC
  Administered 2024-06-17 – 2024-06-18 (×3): 10 mg via ORAL
  Filled 2024-06-17 (×3): qty 1

## 2024-06-17 MED ORDER — SODIUM CHLORIDE 0.9% FLUSH
3.0000 mL | Freq: Two times a day (BID) | INTRAVENOUS | Status: DC
  Administered 2024-06-17: 3 mL via INTRAVENOUS

## 2024-06-17 NOTE — Progress Notes (Signed)
 Did admission questions with patient confirmed with NT sister is contact patient answers consistent with notes and previous admit answers.  Just started new BP per sister for 5 days. Adjusted contact number to reflect lives with mother and Sister is POA

## 2024-06-17 NOTE — Progress Notes (Signed)
 " PROGRESS NOTE    Dennis Huffman  FMW:[id number] DOB: Aug 15, 1950 DOA: 06/16/2024 PCP: Ransom Other, MD   Brief Narrative:   Dennis Huffman is a 72 y.o. male with medical history significant of prostate cancer, osteoarthritis, history of bradycardia, chronic kidney disease stage II, hyperlipidemia, essential hypertension, history of PACs, history of prostate cancer, dementia, who was brought in from home after sustaining a syncopal episode.  Patient was not observed but he was hard to have following by family they went and found him on the floor.  He was out for a little while.  Due to dementia patient does not remember what happened unable to give history.  Family reported the incident.  He is not on any blood thinner.  Did not hit his head.  In the ER workup performed showed patient still has some bradycardia.  Also noted to have hypomagnesemia with magnesium  of 0.9.  EKG showed mainly bradycardia.  Patient has been admitted with syncopal episode and hypomagnesemia    Assessment & Plan:   Principal Problem:   Syncope and collapse Active Problems:   Bradycardia   Hyperlipidemia   Prostate cancer [facility])   Essential hypertension   Hypomagnesemia   Dementia (HCC)   #1 syncope: Patient had syncopal episode in August and had full workup done.  No significant findings then.  - Will check for orthostatics -Sinus bradycardia on EKG, magnesium  critically low 0.9  - Magnesium  replaced however it is still significantly low at 0.8.  Will give IV magnesium  4 g today, repeat levels in the a.m. - Continue to monitor in telemetry - Briefly discussed with Dr. Wadie, will plan for outpatient monitor 30 day  #2 hypomagnesemia: Replete magnesium  and follow closely.   #3 dementia: No agitation.  Patient unable to remember what happened.  Resume home regimen of Namenda  and Aricept    #4 essential hypertension: Will confirm and resume home regimen.   #5 history of prostate cancer: Appears to be in  remission.  Continue to monitor   #6 hyperlipidemia: Continue with statin   #7 sinus bradycardia: Chronic but persistent.  This may have gotten worse leading to the syncope.  Was evaluated in August but not thought to be the cause.  Continue telemonitoring      Advance Care Planning:   Code Status: Full Code     Family Communication:   Severity of Illness: The appropriate patient status for this patient is OBSERVATION. Observation status is judged to be reasonable and necessary in order to provide the required intensity of service to ensure the patient's safety. The patient's presenting symptoms, physical exam findings, and initial radiographic and laboratory data in the context of their medical condition is felt to place them at decreased risk for further clinical deterioration. Furthermore, it is anticipated that the patient will be medically stable for discharge from the hospital within 2 midnights of admission.   Subjective:  Patient seen and examined at the bedside.  He is alert and conversant.  Some confusion consistent with dementia.  Denies dizziness. Objective: Vitals:   06/17/24 0922 06/17/24 0945 06/17/24 1045 06/17/24 1127  BP: (!) 143/60 (!) 141/64 132/65 (!) 144/72  Pulse: 61 (!) 57 63 (!) 59  Resp: 15  15   Temp: 98.2 F (36.8 C)  98.3 F (36.8 C) 98.8 F (37.1 C)  TempSrc: Oral  Oral Oral  SpO2: 100% 98% 100% 98%  Weight:      Height:       No intake or  output data in the 24 hours ending 06/17/24 1612 Filed Weights   06/16/24 1954  Weight: 93.9 kg    Examination: Constitutional: Confused, chronically ill looking NAD, calm, comfortable Eyes: PERRL, lids and conjunctivae normal ENMT: Mucous membranes are moist. Posterior pharynx clear of any exudate or lesions.Normal dentition.  Neck: normal, supple, no masses, no thyromegaly Respiratory: clear to auscultation bilaterally, no wheezing, no crackles. Normal respiratory effort. No accessory muscle use.   Cardiovascular: Sinus bradycardia no murmurs / rubs / gallops. No extremity edema. 2+ pedal pulses. No carotid bruits.  Abdomen: no tenderness, no masses palpated. No hepatosplenomegaly. Bowel sounds positive.  Musculoskeletal: Good range of motion, no joint swelling or tenderness, Skin: no rashes, lesions, ulcers. No induration Neurologic: CN 2-12 grossly intact. Sensation intact, DTR normal. Strength 5/5 in all 4.  Psychiatric: Confused, responsive to voice and communicating. Normal mood     Data Reviewed: I have personally reviewed following labs and imaging studies  CBC: Recent Labs  Lab 06/16/24 2103  WBC 7.5  NEUTROABS 5.7  HGB 12.7*  HCT 35.6*  MCV 79.1*  PLT 182   Basic Metabolic Panel: Recent Labs  Lab 06/16/24 2103 06/17/24 1419  NA 142 140  K 3.7 4.0  CL 105 105  CO2 29 24  GLUCOSE 116* 97  BUN 10 13  CREATININE 1.32* 1.32*  CALCIUM  8.8* 8.8*  MG 0.9* 0.8*   GFR: Estimated Creatinine Clearance: 56.6 mL/min (A) (by C-G formula based on SCr of 1.32 mg/dL (H)). Liver Function Tests: Recent Labs  Lab 06/16/24 2103  AST 25  ALT 28  ALKPHOS 63  BILITOT 0.9  PROT 6.3*  ALBUMIN 4.0   Recent Labs  Lab 06/16/24 2103  LIPASE 21   No results for input(s): AMMONIA in the last 168 hours. Coagulation Profile: No results for input(s): INR, PROTIME in the last 168 hours. Cardiac Enzymes: No results for input(s): CKTOTAL, CKMB, CKMBINDEX, TROPONINI in the last 168 hours. BNP (last 3 results) Recent Labs    06/16/24 2103  PROBNP 63.1   HbA1C: No results for input(s): HGBA1C in the last 72 hours. CBG: No results for input(s): GLUCAP in the last 168 hours. Lipid Profile: No results for input(s): CHOL, HDL, LDLCALC, TRIG, CHOLHDL, LDLDIRECT in the last 72 hours. Thyroid  Function Tests: No results for input(s): TSH, T4TOTAL, FREET4, T3FREE, THYROIDAB in the last 72 hours. Anemia Panel: No results for input(s):  VITAMINB12, FOLATE, FERRITIN, TIBC, IRON, RETICCTPCT in the last 72 hours. Sepsis Labs: No results for input(s): PROCALCITON, LATICACIDVEN in the last 168 hours.  No results found for this or any previous visit (from the past 240 hours).       Radiology Studies: CT Head Wo Contrast Result Date: 06/16/2024 CLINICAL DATA:  Mental status change EXAM: CT HEAD WITHOUT CONTRAST TECHNIQUE: Contiguous axial images were obtained from the base of the skull through the vertex without intravenous contrast. RADIATION DOSE REDUCTION: This exam was performed according to the departmental dose-optimization program which includes automated exposure control, adjustment of the mA and/or kV according to patient size and/or use of iterative reconstruction technique. COMPARISON:  MRI 02/13/2024, head CT 02/13/2024 FINDINGS: Brain: No acute territorial infarction, hemorrhage or intracranial mass. Redemonstrated frontal and temporal atrophy. The ventricles are stable in size. Vascular: No hyperdense vessels.  Carotid vascular calcification Skull: Normal. Negative for fracture or focal lesion. Sinuses/Orbits: Old fracture medial wall right orbit. No acute process Other: None IMPRESSION: 1. No CT evidence for acute intracranial abnormality. 2. Similar frontal  predominant atrophy. Electronically Signed   By: Luke Bun M.D.   On: 06/16/2024 21:15   DG Chest Portable 1 View Result Date: 06/16/2024 CLINICAL DATA:  Syncope EXAM: PORTABLE CHEST 1 VIEW COMPARISON:  01/25/2024, 01/01/2020, chest 01/18/2020 FINDINGS: Borderline to mild cardiomegaly. Ectatic aortic in a but similar compared to 2021. No focal airspace disease, pleural effusion or pneumothorax. IMPRESSION: No active disease. Borderline to mild cardiomegaly. Electronically Signed   By: Luke Bun M.D.   On: 06/16/2024 21:12        Scheduled Meds:  atorvastatin   20 mg Oral Daily   donepezil   10 mg Oral QHS   enoxaparin  (LOVENOX ) injection   40 mg Subcutaneous Q24H   [START ON 06/18/2024] Influenza vac split trivalent PF  0.5 mL Intramuscular Tomorrow-1000   memantine   10 mg Oral BID   pantoprazole   40 mg Oral Daily   sertraline   50 mg Oral Daily   sodium chloride  flush  3 mL Intravenous Q12H   Continuous Infusions:  lactated ringers      magnesium  sulfate bolus IVPB            Shernita Rabinovich, MD [facility] 06/17/2024, 4:12 PM   "

## 2024-06-17 NOTE — Discharge Planning (Signed)
 Transition of Care [facility]) - Inpatient Brief Assessment   Patient Details  Name: Dennis Huffman MRN: [id number] Date of Birth: January 20, 1951  [facility] CM/SW Contact:    Debarah Saunas, RN Phone Number: 06/17/2024, 9:45 AM   Clinical Narrative:  Transition of Care Department [facility]) has reviewed patient and no TOC needs have been identified at this time. We will continue to monitor patient advancement through interdisciplinary progression rounds. If new patient transition needs arise, please place a TOC consult.     Transition of Care Asessment: Insurance and Status: Insurance coverage has been reviewed Patient has primary care physician: Yes Home environment has been reviewed: from home with family (siblings) Prior level of function:: independent Prior/Current Home Services: No current home services Social Drivers of Health Review: SDOH reviewed no interventions necessary Readmission risk has been reviewed: Yes Transition of care needs: no transition of care needs at this time

## 2024-06-17 NOTE — Care Management Obs Status (Signed)
 MEDICARE OBSERVATION STATUS NOTIFICATION   Patient Details  Name: Dennis Huffman MRN: [id number] Date of Birth: 06/08/1951   Medicare Observation Status Notification Given:  Yes    Debarah Saunas, RN 06/17/2024, 9:47 AM

## 2024-06-17 NOTE — Plan of Care (Signed)

## 2024-06-17 NOTE — Progress Notes (Signed)
 Primary team discussed the case with Dr. Court, plan to set up outpatient 30-day heart monitor for syncope after discharge.  I will send a staff message to arrange

## 2024-06-17 NOTE — ED Notes (Signed)
 Pt keeps pulling off cardiac leads. Pt hooked up to BP and pulse ox at this time.

## 2024-06-18 DIAGNOSIS — R55 Syncope and collapse: Secondary | ICD-10-CM | POA: Diagnosis not present

## 2024-06-18 LAB — COMPREHENSIVE METABOLIC PANEL WITH GFR
ALT: 20 U/L (ref 0–44)
AST: 21 U/L (ref 15–41)
Albumin: 3.9 g/dL (ref 3.5–5.0)
Alkaline Phosphatase: 57 U/L (ref 38–126)
Anion gap: 8 (ref 5–15)
BUN: 12 mg/dL (ref 8–23)
CO2: 26 mmol/L (ref 22–32)
Calcium: 9.1 mg/dL (ref 8.9–10.3)
Chloride: 105 mmol/L (ref 98–111)
Creatinine, Ser: 1.2 mg/dL (ref 0.61–1.24)
GFR, Estimated: >60 mL/min
Glucose, Bld: 110 mg/dL — ABNORMAL HIGH (ref 70–99)
Potassium: 3.6 mmol/L (ref 3.5–5.1)
Sodium: 140 mmol/L (ref 135–145)
Total Bilirubin: 1.9 mg/dL — ABNORMAL HIGH (ref 0.0–1.2)
Total Protein: 6.3 g/dL — ABNORMAL LOW (ref 6.5–8.1)

## 2024-06-18 LAB — GLUCOSE, CAPILLARY: Glucose-Capillary: 100 mg/dL — ABNORMAL HIGH (ref 70–99)

## 2024-06-18 LAB — MAGNESIUM: Magnesium: 1.7 mg/dL (ref 1.7–2.4)

## 2024-06-18 MED ORDER — MAGNESIUM OXIDE 400 MG PO TABS: 400.0000 mg | ORAL_TABLET | Freq: Every day | ORAL | 0 refills | Status: AC

## 2024-06-18 MED ORDER — POTASSIUM CHLORIDE CRYS ER 20 MEQ PO TBCR
20.0000 meq | EXTENDED_RELEASE_TABLET | Freq: Once | ORAL | Status: AC
  Administered 2024-06-18: 20 meq via ORAL
  Filled 2024-06-18: qty 1

## 2024-06-18 MED ORDER — MAGNESIUM SULFATE IN D5W 1-5 GM/100ML-% IV SOLN
1.0000 g | Freq: Once | INTRAVENOUS | Status: AC
  Administered 2024-06-18: 1 g via INTRAVENOUS
  Filled 2024-06-18: qty 100

## 2024-06-18 NOTE — Evaluation (Signed)
 Physical Therapy Evaluation Patient Details Name: Dennis Huffman MRN: [id number] DOB: February 05, 1951 Today's Date: 06/18/2024  History of Present Illness  Pt is 73 yo presenting to [facility] on 12/23 due to syncopal episode. Pt admitted for syncopal episode and hypomagnesemia.  PMhx; dementia, HLD, bradycardia, prostate CA, HTN, CKD, Lt THA, Rt RCR, ACDF  Clinical Impression  Pt is currently presenting at supervision for all functional activities due to impulsivity and cognitive deficits. Pt needs no physical assist or AD with bed mobility, sit to stand or gait. Pt will benefit from continued mobility while in the hospital by mobility team and nursing staff but due to pt is at baseline level of functioning does not have needs for acute physical therapy services at this time.  Pt will be discharged from skilled physical therapy services at this time; please re-consult if further needs arise.            If plan is discharge home, recommend the following: Supervision due to cognitive status     Equipment Recommendations None recommended by PT     Functional Status Assessment Patient has not had a recent decline in their functional status     Precautions / Restrictions Precautions Precautions: Fall Recall of Precautions/Restrictions: Impaired Restrictions Weight Bearing Restrictions Per Provider Order: No      Mobility  Bed Mobility Overal bed mobility: Needs Assistance Bed Mobility: Supine to Sit, Sit to Supine     Supine to sit: Supervision Sit to supine: Supervision        Transfers Overall transfer level: Needs assistance Equipment used: None Transfers: Sit to/from Stand Sit to Stand: Supervision      Ambulation/Gait Ambulation/Gait assistance: Supervision Gait Distance (Feet): 400 Feet Assistive device: None Gait Pattern/deviations: Step-through pattern Gait velocity: WFL Gait velocity interpretation: >4.37 ft/sec, indicative of normal walking speed   General Gait  Details: 1x pt drifting to the R; good righting reactions; potentially just impulsive and behavior due to pt laughing after  Stairs Stairs:  (did not perform today due to pt impulsive and wanting to leave.. Pt demonstrates good strength balance with sit to stand and gait. Pt lives on main level and has level entry into home.)            Balance Overall balance assessment: Modified Independent         Pertinent Vitals/Pain Pain Assessment Pain Assessment: No/denies pain    Home Living Family/patient expects to be discharged to:: Private residence Living Arrangements: Parent;Other relatives Available Help at Discharge: Family;Available 24 hours/day Type of Home: House Home Access: Level entry       Home Layout: Two level;Able to live on main level with bedroom/bathroom Home Equipment: None Additional Comments: from pt note 4 months ago pt lives with 81 yo mom and sisters which was confirmed    Prior Function Prior Level of Function : Needs assist             Mobility Comments: Ind at baseline ADLs Comments: Patient stating Ind with ADL and toileting.  Sister assists with iADL and meals.  Daughter assists with medications.     Extremity/Trunk Assessment   Upper Extremity Assessment Upper Extremity Assessment: Overall WFL for tasks assessed    Lower Extremity Assessment Lower Extremity Assessment: Overall WFL for tasks assessed    Cervical / Trunk Assessment Cervical / Trunk Assessment: Normal  Communication   Communication Communication: No apparent difficulties    Cognition Arousal: Alert Behavior During Therapy: Impulsive   PT - Cognitive  impairments: History of cognitive impairments, Safety/Judgement     Following commands: Intact       Cueing Cueing Techniques: Verbal cues            Assessment/Plan    PT Assessment Patient does not need any further PT services         PT Goals (Current goals can be found in the Care Plan section)   Acute Rehab PT Goals PT Goal Formulation: Patient unable to participate in goal setting     AM-PAC PT 6 Clicks Mobility  Outcome Measure Help needed turning from your back to your side while in a flat bed without using bedrails?: A Little Help needed moving from lying on your back to sitting on the side of a flat bed without using bedrails?: A Little Help needed moving to and from a bed to a chair (including a wheelchair)?: A Little Help needed standing up from a chair using your arms (e.g., wheelchair or bedside chair)?: A Little Help needed to walk in hospital room?: A Little Help needed climbing 3-5 steps with a railing? : A Little 6 Click Score: 18    End of Session Equipment Utilized During Treatment: Gait belt Activity Tolerance: Patient tolerated treatment well Patient left: in bed;with call bell/phone within reach;with bed alarm set;with nursing/sitter in room Nurse Communication: Mobility status      Time: 8858-8845 PT Time Calculation (min) (ACUTE ONLY): 13 min   Charges:   PT Evaluation $PT Eval Low Complexity: 1 Low   PT General Charges $$ ACUTE PT VISIT: 1 Visit        Dorothyann Maier, DPT, CLT  Acute Rehabilitation Services Office: [phone] (Secure chat preferred)   Dorothyann VEAR Maier 06/18/2024, 12:07 PM

## 2024-06-18 NOTE — Plan of Care (Signed)

## 2024-06-18 NOTE — Discharge Summary (Signed)
 Physician Discharge Summary  Dennis Huffman FMW:[id number] DOB: 06-23-1951 DOA: 06/16/2024  PCP: Ransom Other, MD  Admit date: 06/16/2024 Discharge date: 06/18/2024  Admitted From: Home Disposition: Home  Recommendations for Outpatient Follow-up:  Follow up with PCP in 1 week with repeat Magnesium  level Cardiology to arrange outpatient monitoring Follow up in ED if symptoms worsen or new appear   Home Health: No Equipment/Devices: None  Discharge Condition: Stable CODE STATUS: Full Diet recommendation: Heart healthy  Brief/Interim Summary:  Dennis Huffman is a 73 y.o. male with medical history significant of prostate cancer, osteoarthritis, history of bradycardia, chronic kidney disease stage II, hyperlipidemia, essential hypertension, history of PACs, history of prostate cancer, dementia, who was brought in from home after sustaining a syncopal episode.  Patient was not observed but he was heard to have fallen by family they went and found him on the floor.  He was out for a little while.  Due to dementia patient does not remember what happened unable to give history.  Family reported the incident.  He is not on any blood thinner.  Did not hit his head.  In the ER workup performed showed patient has sinus bradycardia.  Also noted to have hypomagnesemia with magnesium  of 0.9.  EKG showed mainly bradycardia.  Patient has been admitted with syncopal episode and hypomagnesemia      Assessment & Plan:    #1 syncope: Patient had syncopal episode in August and had full workup done.  No significant findings then.  -Sinus bradycardia on EKG, magnesium  critically low 0.9  - Magnesium  replaced significantly, will Magnesium  supplement on discharge and follow-up with PCP in 1 week. - Continue to monitor in telemetry - Briefly discussed with Dr. Wadie, will plan for outpatient monitor 30 day   #2 hypomagnesemia: Magnesium  oxide daily on discharge, follow-up with PCP in 1 week.  #3  dementia: Continue Namenda  and Aricept    #4 essential hypertension: Patient used to be on multiple antihypertensives like amlodipine , losartan , metoprolol  which were discontinued around August.  He was recently placed on olmesartan due to increasing blood pressure. -Continue olmesartan.  #5 history of prostate cancer: Appears to be in remission.  Continue to monitor   #6 hyperlipidemia: Continue with statin   #7 sinus bradycardia: Chronic.  Discontinue metoprolol   Discharge home with family.  Discussed discharge plan with sister Karna over the phone  Discharge Diagnoses:  Principal Problem:   Syncope and collapse Active Problems:   Bradycardia   Hyperlipidemia   Prostate cancer (HCC)   Essential hypertension   Hypomagnesemia   Dementia [facility])    Discharge Instructions  Discharge Instructions     Call MD for:  extreme fatigue   Complete by: As directed    Call MD for:  persistant dizziness or light-headedness   Complete by: As directed    Call MD for:  persistant nausea and vomiting   Complete by: As directed    Diet - low sodium heart healthy   Complete by: As directed    Discharge instructions   Complete by: As directed    1. Start magnesium  supplement. F/u with PCP in 1 week 2, Check BP twice daily at home 3. Orthostatic precautions discussed with daughter 4. Cardiac monitoring outpatient   Increase activity slowly   Complete by: As directed       Allergies as of 06/18/2024   No Known Allergies      Medication List     STOP taking these medications  amLODipine  10 MG tablet Commonly known as: NORVASC    losartan  100 MG tablet Commonly known as: COZAAR    metoprolol  succinate 25 MG 24 hr tablet Commonly known as: TOPROL -XL       TAKE these medications    acetaminophen  650 MG CR tablet Commonly known as: TYLENOL  Take 650-1,300 mg by mouth every 8 (eight) hours as needed for pain.   atorvastatin  20 MG tablet Commonly known as: LIPITOR Take  20 mg by mouth daily.   donepezil  10 MG tablet Commonly known as: ARICEPT  Take 1 tablet (10 mg total) by mouth at bedtime.   magnesium  oxide 400 MG tablet Commonly known as: MAG-OX Take 1 tablet (400 mg total) by mouth daily.   memantine  10 MG tablet Commonly known as: NAMENDA  Take 1 tablet (10 mg total) by mouth 2 (two) times daily. Begin this prescription after the 5 mg is complete   olmesartan 20 MG tablet Commonly known as: BENICAR Take 20 mg by mouth daily.   pantoprazole  40 MG tablet Commonly known as: PROTONIX  Take 40 mg by mouth daily.   sertraline  50 MG tablet Commonly known as: ZOLOFT  Take 50 mg by mouth daily.        Follow-up Information     Vannie Reche RAMAN, NP Follow up on 08/18/2024.   Specialty: Cardiology Why: @1 :55PM. Cardiology follow up. Cardiology scheduler will set up a 30 day monitor Contact information: [address] [place] [address] [phone]         Ransom Other, MD Follow up in 1 week(s).   Specialty: Internal Medicine Contact information: [address], [address] [place] [address]                Allergies[1]  Consultations:    Procedures/Studies: CT Head Wo Contrast Result Date: 06/16/2024 CLINICAL DATA:  Mental status change EXAM: CT HEAD WITHOUT CONTRAST TECHNIQUE: Contiguous axial images were obtained from the base of the skull through the vertex without intravenous contrast. RADIATION DOSE REDUCTION: This exam was performed according to the departmental dose-optimization program which includes automated exposure control, adjustment of the mA and/or kV according to patient size and/or use of iterative reconstruction technique. COMPARISON:  MRI 02/13/2024, head CT 02/13/2024 FINDINGS: Brain: No acute territorial infarction, hemorrhage or intracranial mass. Redemonstrated frontal and temporal atrophy. The ventricles are stable in size. Vascular: No hyperdense vessels.  Carotid vascular  calcification Skull: Normal. Negative for fracture or focal lesion. Sinuses/Orbits: Old fracture medial wall right orbit. No acute process Other: None IMPRESSION: 1. No CT evidence for acute intracranial abnormality. 2. Similar frontal predominant atrophy. Electronically Signed   By: Luke Bun M.D.   On: 06/16/2024 21:15   DG Chest Portable 1 View Result Date: 06/16/2024 CLINICAL DATA:  Syncope EXAM: PORTABLE CHEST 1 VIEW COMPARISON:  01/25/2024, 01/01/2020, chest 01/18/2020 FINDINGS: Borderline to mild cardiomegaly. Ectatic aortic in a but similar compared to 2021. No focal airspace disease, pleural effusion or pneumothorax. IMPRESSION: No active disease. Borderline to mild cardiomegaly. Electronically Signed   By: Luke Bun M.D.   On: 06/16/2024 21:12      Subjective:  , Not in distress alert, confused Discharge Exam: Vitals:   06/17/24 1955 06/18/24 0511  BP: 139/61 (!) 162/81  Pulse: (!) 55 (!) 52  Resp: 18 18  Temp: 98 F (36.7 C) 98.2 F (36.8 C)  SpO2: 98% 98%    Constitutional: Confused, chronically ill looking NAD, calm, comfortable Eyes: PERRL, lids and conjunctivae normal ENMT: Mucous membranes are moist.  Posterior pharynx clear of any exudate or lesions.Normal dentition.  Neck: normal, supple, no masses, no thyromegaly Respiratory: clear to auscultation bilaterally, no wheezing, no crackles. Normal respiratory effort. No accessory muscle use.  Cardiovascular: Sinus bradycardia no murmurs / rubs / gallops. No extremity edema. 2+ pedal pulses. No carotid bruits.  Abdomen: no tenderness, no masses palpated. No hepatosplenomegaly. Bowel sounds positive.  Musculoskeletal: Good range of motion, no joint swelling or tenderness, Skin: no rashes, lesions, ulcers. No induration Neurologic: CN 2-12 grossly intact. Sensation intact, DTR normal. Strength 5/5 in all 4.  Psychiatric: Confused, responsive to voice and communicating. Normal mood   The results of significant  diagnostics from this hospitalization (including imaging, microbiology, ancillary and laboratory) are listed below for reference.     Microbiology: No results found for this or any previous visit (from the past 240 hours).   Labs: BNP (last 3 results) No results for input(s): BNP in the last 8760 hours. Basic Metabolic Panel: Recent Labs  Lab 06/16/24 2103 06/17/24 1419 06/18/24 0043 06/18/24 0420  NA 142 140 140  --   K 3.7 4.0 3.6  --   CL 105 105 105  --   CO2 29 24 26   --   GLUCOSE 116* 97 110*  --   BUN 10 13 12   --   CREATININE 1.32* 1.32* 1.20  --   CALCIUM  8.8* 8.8* 9.1  --   MG 0.9* 0.8*  --  1.7   Liver Function Tests: Recent Labs  Lab 06/16/24 2103 06/18/24 0043  AST 25 21  ALT 28 20  ALKPHOS 63 57  BILITOT 0.9 1.9*  PROT 6.3* 6.3*  ALBUMIN 4.0 3.9   Recent Labs  Lab 06/16/24 2103  LIPASE 21   No results for input(s): AMMONIA in the last 168 hours. CBC: Recent Labs  Lab 06/16/24 2103  WBC 7.5  NEUTROABS 5.7  HGB 12.7*  HCT 35.6*  MCV 79.1*  PLT 182   Cardiac Enzymes: No results for input(s): CKTOTAL, CKMB, CKMBINDEX, TROPONINI in the last 168 hours. BNP: Invalid input(s): POCBNP CBG: Recent Labs  Lab 06/18/24 0633  GLUCAP 100*   D-Dimer No results for input(s): DDIMER in the last 72 hours. Hgb A1c No results for input(s): HGBA1C in the last 72 hours. Lipid Profile No results for input(s): CHOL, HDL, LDLCALC, TRIG, CHOLHDL, LDLDIRECT in the last 72 hours. Thyroid  function studies No results for input(s): TSH, T4TOTAL, T3FREE, THYROIDAB in the last 72 hours.  Invalid input(s): FREET3 Anemia work up No results for input(s): VITAMINB12, FOLATE, FERRITIN, TIBC, IRON, RETICCTPCT in the last 72 hours. Urinalysis    Component Value Date/Time   COLORURINE YELLOW 06/17/2024 0751   APPEARANCEUR CLEAR 06/17/2024 0751   LABSPEC 1.014 06/17/2024 0751   PHURINE 7.0 06/17/2024 0751    GLUCOSEU NEGATIVE 06/17/2024 0751   HGBUR NEGATIVE 06/17/2024 0751   BILIRUBINUR NEGATIVE 06/17/2024 0751   KETONESUR NEGATIVE 06/17/2024 0751   PROTEINUR NEGATIVE 06/17/2024 0751   UROBILINOGEN 0.2 02/18/2007 2108   NITRITE NEGATIVE 06/17/2024 0751   LEUKOCYTESUR NEGATIVE 06/17/2024 0751   Sepsis Labs Recent Labs  Lab 06/16/24 2103  WBC 7.5   Microbiology No results found for this or any previous visit (from the past 240 hours).   Time coordinating discharge: 35 minutes  SIGNED:   Jaylen Claude, MD  [facility] 06/18/2024, 1:48 PM      [1] No Known Allergies

## 2024-06-18 NOTE — TOC Transition Note (Signed)
 Transition of Care [facility]) - Discharge Note   Patient Details  Name: Dennis Huffman MRN: [id number] Date of Birth: [birth year]  Transition [facility]) CM/SW Contact:  Justina Delcia Czar, RN Phone Number: [phone] 06/18/2024, 3:41 PM   Clinical Narrative:    Chart review and Inpatient CM needs identified.  Faxed dc summary to PCP to arrange hospital follow up appt with patient.    Final next level of care: Home/Self Care Barriers to Discharge: No Barriers Identified   Patient Goals and CMS Choice            Discharge Placement     Discharge Plan and Services Additional resources added to the After Visit Summary for        Social Drivers of Health (SDOH) Interventions SDOH Screenings   Food Insecurity: No Food Insecurity (06/17/2024)  Housing: Low Risk (06/17/2024)  Transportation Needs: No Transportation Needs (06/17/2024)  Utilities: Not At Risk (06/17/2024)  Social Connections: Moderately Isolated (06/17/2024)  Tobacco Use: Medium Risk (06/17/2024)     Readmission Risk Interventions    06/18/2024    3:39 PM  Readmission Risk Prevention Plan  Transportation Screening Complete  PCP or Specialist Appt within 5-7 Days Not Complete  Not Complete comments holiday, dc summary faxed to PCP office for an appt  Home Care Screening Complete  Medication Review (RN CM) Complete

## 2024-07-14 ENCOUNTER — Ambulatory Visit

## 2024-07-14 DIAGNOSIS — R55 Syncope and collapse: Secondary | ICD-10-CM

## 2024-07-30 DIAGNOSIS — R55 Syncope and collapse: Secondary | ICD-10-CM | POA: Diagnosis not present

## 2024-08-18 ENCOUNTER — Ambulatory Visit (HOSPITAL_BASED_OUTPATIENT_CLINIC_OR_DEPARTMENT_OTHER): Admitting: Family

## 2024-09-09 ENCOUNTER — Ambulatory Visit: Admitting: Family Medicine

## 2024-12-29 ENCOUNTER — Ambulatory Visit: Admitting: Family Medicine
